# Patient Record
Sex: Female | Born: 1965 | Race: White | Hispanic: No | Marital: Married | Smoking: Former smoker
Health system: Southern US, Community
[De-identification: ages and names within clinical notes are randomized; demographics above are authoritative.]

## PROBLEM LIST (undated history)

## (undated) DIAGNOSIS — I10 Essential (primary) hypertension: Secondary | ICD-10-CM

## (undated) DIAGNOSIS — G5603 Carpal tunnel syndrome, bilateral upper limbs: Secondary | ICD-10-CM

## (undated) DIAGNOSIS — F419 Anxiety disorder, unspecified: Secondary | ICD-10-CM

## (undated) DIAGNOSIS — L0390 Cellulitis, unspecified: Secondary | ICD-10-CM

## (undated) DIAGNOSIS — A045 Campylobacter enteritis: Secondary | ICD-10-CM

## (undated) DIAGNOSIS — M1990 Unspecified osteoarthritis, unspecified site: Secondary | ICD-10-CM

## (undated) DIAGNOSIS — F429 Obsessive-compulsive disorder, unspecified: Secondary | ICD-10-CM

## (undated) DIAGNOSIS — M722 Plantar fascial fibromatosis: Secondary | ICD-10-CM

## (undated) HISTORY — PX: SPINE SURGERY: SHX786

## (undated) HISTORY — DX: Plantar fascial fibromatosis: M72.2

## (undated) HISTORY — DX: Unspecified osteoarthritis, unspecified site: M19.90

## (undated) HISTORY — DX: Cellulitis, unspecified: L03.90

## (undated) HISTORY — PX: EYE SURGERY: SHX253

## (undated) HISTORY — DX: Obsessive-compulsive disorder, unspecified: F42.9

## (undated) HISTORY — DX: Campylobacter enteritis: A04.5

## (undated) HISTORY — DX: Essential (primary) hypertension: I10

## (undated) HISTORY — PX: CERVICAL DISCECTOMY: SHX98

## (undated) HISTORY — DX: Anxiety disorder, unspecified: F41.9

---
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
## 1994-03-02 DIAGNOSIS — A045 Campylobacter enteritis: Secondary | ICD-10-CM

## 1994-03-02 HISTORY — DX: Campylobacter enteritis: A04.5

## 1998-12-31 ENCOUNTER — Encounter: Admission: RE | Admit: 1998-12-31 | Discharge: 1999-03-31 | Payer: Self-pay | Admitting: *Deleted

## 2002-02-09 ENCOUNTER — Other Ambulatory Visit: Admission: RE | Admit: 2002-02-09 | Discharge: 2002-02-09 | Payer: Self-pay | Admitting: Obstetrics and Gynecology

## 2002-03-07 ENCOUNTER — Encounter: Payer: Self-pay | Admitting: Obstetrics and Gynecology

## 2002-03-07 ENCOUNTER — Encounter: Admission: RE | Admit: 2002-03-07 | Discharge: 2002-03-07 | Payer: Self-pay | Admitting: Obstetrics and Gynecology

## 2002-03-09 ENCOUNTER — Encounter: Payer: Self-pay | Admitting: Obstetrics and Gynecology

## 2002-03-09 ENCOUNTER — Encounter: Admission: RE | Admit: 2002-03-09 | Discharge: 2002-03-09 | Payer: Self-pay | Admitting: Obstetrics and Gynecology

## 2002-09-25 ENCOUNTER — Ambulatory Visit (HOSPITAL_COMMUNITY): Admission: RE | Admit: 2002-09-25 | Discharge: 2002-09-25 | Payer: Self-pay | Admitting: Family Medicine

## 2003-03-03 HISTORY — PX: RETINAL DETACHMENT SURGERY: SHX105

## 2003-04-04 ENCOUNTER — Ambulatory Visit (HOSPITAL_COMMUNITY): Admission: RE | Admit: 2003-04-04 | Discharge: 2003-04-04 | Payer: Self-pay | Admitting: Nurse Practitioner

## 2003-11-26 ENCOUNTER — Ambulatory Visit (HOSPITAL_COMMUNITY): Admission: RE | Admit: 2003-11-26 | Discharge: 2003-11-26 | Payer: Self-pay | Admitting: Ophthalmology

## 2004-06-23 ENCOUNTER — Ambulatory Visit (HOSPITAL_COMMUNITY): Admission: RE | Admit: 2004-06-23 | Discharge: 2004-06-23 | Payer: Self-pay | Admitting: Obstetrics and Gynecology

## 2005-03-02 DIAGNOSIS — F429 Obsessive-compulsive disorder, unspecified: Secondary | ICD-10-CM

## 2005-03-02 HISTORY — DX: Obsessive-compulsive disorder, unspecified: F42.9

## 2005-06-26 ENCOUNTER — Ambulatory Visit (HOSPITAL_COMMUNITY): Admission: RE | Admit: 2005-06-26 | Discharge: 2005-06-26 | Payer: Self-pay | Admitting: Obstetrics and Gynecology

## 2006-06-28 ENCOUNTER — Ambulatory Visit (HOSPITAL_COMMUNITY): Admission: RE | Admit: 2006-06-28 | Discharge: 2006-06-28 | Payer: Self-pay | Admitting: Obstetrics and Gynecology

## 2007-06-29 ENCOUNTER — Ambulatory Visit (HOSPITAL_COMMUNITY): Admission: RE | Admit: 2007-06-29 | Discharge: 2007-06-29 | Payer: Self-pay | Admitting: Obstetrics and Gynecology

## 2008-07-11 ENCOUNTER — Ambulatory Visit (HOSPITAL_COMMUNITY): Admission: RE | Admit: 2008-07-11 | Discharge: 2008-07-11 | Payer: Self-pay | Admitting: Obstetrics and Gynecology

## 2009-07-19 ENCOUNTER — Ambulatory Visit (HOSPITAL_COMMUNITY): Admission: RE | Admit: 2009-07-19 | Discharge: 2009-07-19 | Payer: Self-pay | Admitting: Obstetrics and Gynecology

## 2010-06-17 ENCOUNTER — Other Ambulatory Visit (HOSPITAL_COMMUNITY): Payer: Self-pay | Admitting: Obstetrics and Gynecology

## 2010-06-17 DIAGNOSIS — Z139 Encounter for screening, unspecified: Secondary | ICD-10-CM

## 2010-07-18 NOTE — Op Note (Signed)
NAMECHEMIKA, NIGHTENGALE NO.:  [id number]   MEDICAL RECORD NO.:  [id number]          PATIENT TYPE:  OIB   LOCATION:  2899                         FACILITY:  [facility]   PHYSICIAN:  Alford Highland. Rankin, M.D.   DATE OF BIRTH:  [birth year]   DATE OF PROCEDURE:  11/26/2003  DATE OF DISCHARGE:                                 OPERATIVE REPORT   PREOPERATIVE DIAGNOSES:  1.  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  2.  Lattice degeneration, left eye.   POSTOPERATIVE DIAGNOSES:  1.  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  2.  Lattice degeneration, left eye.   PROCEDURE:  1.  Scleral buckle with retinal cryopexy of left eye using 287, 70 and 240      elements.  2.  Retinal cryopexy.  3.  External drainage of subretinal fluid within the bed of the buckle      inferotemporally.   SURGEON:  Alford Highland. Rankin, M.D.   ANESTHESIA:  General endotracheal anesthesia.   INDICATION FOR PROCEDURE:  The patient is a 45 year old woman who had a  spontaneous rhegmatogenous retinal detachment of the left eye on the basis  of previous lattice degeneration.  Macula is on.  This is a subclinical  detachment, but extends from the 12 o'clock region to the 7:30 region  temporally in the left eye, but does have signs of clinical chronicity.   The patient understands this is an attempt to reattach the retina.  This is  an attempt to prevent progression of the retinal detachment so as to allow  the best favorable prognosis for long-term visual acuity and visual field  retina maintenance.  She understands the risks of anesthesia including the  rare occurrence of death, but also to the eye including, but not limited to,  hemorrhage, infection, scarring, need for further surgery, no change in  vision, loss of vision and progression of disease despite intervention.   DESCRIPTION OF PROCEDURE:  After appropriate signed consent was obtained,  she was taken to the  operating room.  In the operating room, general  endotracheal anesthesia was instituted without difficulty.  The left  periocular region was sterilely prepped and draped in the usual ophthalmic  fashion.  A lid speculum was applied.  A conjunctival peritomy was then  fashioned 360 degrees and the rectus muscle was isolated on 2-0 silk ties.  Indirect ophthalmoscopy was then performed and under direct observation  using the scope, retinal cryopexy was applied in the bed of the detachment  and to the areas of retinal breaks.  Retinal breaks were identified at  12:30, 1 o'clock, 2, 2:30, 3, 3:30, 4, 5 and 7 o'clock.   A 287 solid silicone explant was selected and placed under the superolateral  and inferior rectus muscles with a 240 encircling band; this was secured in  the inferonasal quadrant with a 7.0 Watzke sleeve.  Preplaced 5-0 Mersilene  mattress sutures were placed in the inferotemporal quadrant of the left eye,  followed by one each in the remaining quadrants.  Under ophthalmoscopy,  external  drainage of subretinal fluid was carried out in the bed of the  buckle.  All fluid was removed in this fashion.  No complications occurred  and the buckle was tightened permanently.  The ends were then joined and  appropriate tension applied with the 240 band.  Remainder of the buckle was  irrigated with bug juice.  Conjunctiva and Tenon's were then brought forward  and closed with an interrupted 7-0 Vicryl suture.  Intraocular pressure was  assessed and found to be adequate.  At this time, a sterile patch and Fox  shield were applied.  She was awakened from anesthesia without difficulty  and taken to the recovery room in good and stable condition.       GAR/MEDQ  D:  11/26/2003  T:  11/27/2003  Job:  [id number]

## 2010-07-25 ENCOUNTER — Ambulatory Visit (HOSPITAL_COMMUNITY)
Admission: RE | Admit: 2010-07-25 | Discharge: 2010-07-25 | Disposition: A | Discharge status: Home / Self Care | Payer: Managed Care, Other (non HMO) | Source: Ambulatory Visit | Attending: Obstetrics and Gynecology | Admitting: Obstetrics and Gynecology

## 2010-07-25 DIAGNOSIS — Z139 Encounter for screening, unspecified: Secondary | ICD-10-CM

## 2010-07-25 DIAGNOSIS — Z1231 Encounter for screening mammogram for malignant neoplasm of breast: Secondary | ICD-10-CM | POA: Insufficient documentation

## 2011-06-23 ENCOUNTER — Other Ambulatory Visit: Payer: Self-pay | Admitting: Obstetrics and Gynecology

## 2011-06-23 DIAGNOSIS — Z139 Encounter for screening, unspecified: Secondary | ICD-10-CM

## 2011-07-31 ENCOUNTER — Ambulatory Visit (HOSPITAL_COMMUNITY)
Admission: RE | Admit: 2011-07-31 | Discharge: 2011-07-31 | Disposition: A | Discharge status: Home / Self Care | Payer: BC Managed Care – PPO | Source: Ambulatory Visit | Attending: Obstetrics and Gynecology | Admitting: Obstetrics and Gynecology

## 2011-07-31 DIAGNOSIS — Z139 Encounter for screening, unspecified: Secondary | ICD-10-CM

## 2011-07-31 DIAGNOSIS — Z1231 Encounter for screening mammogram for malignant neoplasm of breast: Secondary | ICD-10-CM | POA: Insufficient documentation

## 2011-12-11 ENCOUNTER — Ambulatory Visit: Payer: BC Managed Care – PPO | Admitting: Family Medicine

## 2011-12-14 ENCOUNTER — Encounter: Payer: Self-pay | Admitting: Family Medicine

## 2011-12-14 ENCOUNTER — Ambulatory Visit (INDEPENDENT_AMBULATORY_CARE_PROVIDER_SITE_OTHER): Payer: BC Managed Care – PPO | Admitting: Family Medicine

## 2011-12-14 VITALS — BP 128/80 | HR 84 | Resp 15 | Ht 67.0 in | Wt 215.0 lb

## 2011-12-14 DIAGNOSIS — M1990 Unspecified osteoarthritis, unspecified site: Secondary | ICD-10-CM

## 2011-12-14 DIAGNOSIS — F411 Generalized anxiety disorder: Secondary | ICD-10-CM

## 2011-12-14 DIAGNOSIS — Z309 Encounter for contraceptive management, unspecified: Secondary | ICD-10-CM

## 2011-12-14 DIAGNOSIS — E669 Obesity, unspecified: Secondary | ICD-10-CM

## 2011-12-14 DIAGNOSIS — F419 Anxiety disorder, unspecified: Secondary | ICD-10-CM

## 2011-12-14 DIAGNOSIS — I10 Essential (primary) hypertension: Secondary | ICD-10-CM

## 2011-12-14 DIAGNOSIS — Z23 Encounter for immunization: Secondary | ICD-10-CM

## 2011-12-14 MED ORDER — MEDROXYPROGESTERONE ACETATE 150 MG/ML IM SUSP
150.0000 mg | Freq: Once | INTRAMUSCULAR | Status: AC
  Administered 2011-12-14: 150 mg via INTRAMUSCULAR

## 2011-12-14 NOTE — Patient Instructions (Addendum)
Flu shot given  Depo given today  Continue current medications I will review records  F/U 6 months

## 2011-12-16 ENCOUNTER — Encounter: Payer: Self-pay | Admitting: Family Medicine

## 2011-12-16 DIAGNOSIS — F411 Generalized anxiety disorder: Secondary | ICD-10-CM | POA: Insufficient documentation

## 2011-12-16 DIAGNOSIS — I10 Essential (primary) hypertension: Secondary | ICD-10-CM | POA: Insufficient documentation

## 2011-12-16 DIAGNOSIS — E669 Obesity, unspecified: Secondary | ICD-10-CM | POA: Insufficient documentation

## 2011-12-16 DIAGNOSIS — M1990 Unspecified osteoarthritis, unspecified site: Secondary | ICD-10-CM | POA: Insufficient documentation

## 2011-12-16 NOTE — Assessment & Plan Note (Signed)
BP looks good today, no change to meds 

## 2011-12-16 NOTE — Assessment & Plan Note (Signed)
Low dose xanax currently, she states she is a high strung person

## 2011-12-16 NOTE — Progress Notes (Signed)
  Subjective:    Patient ID: Lori Mullins, female    DOB: [birth year], 46 y.o.   MRN: [id number]  HPI Pt here to establish care, previous PCP Dr. Stephania Fragmin, [facility]. GYN- [facility] Medications and history reviewed No specific concerns today, needs Depo shot  Anxiety- takes 1 tablet at bedtime of xanax occasionally during the day  HTN- maintained on benicar and BP has done well Obesity- has lost 20lbs, has kept weight off, exercises daily, quit smoking as well  OA0 uses voltern gels for hands and ultram for back and Hip, history of diskectomy in 1996   Review of Systems  GEN- denies fatigue, fever, weight loss,weakness, recent illness HEENT- denies eye drainage, change in vision, nasal discharge, CVS- denies chest pain, palpitations RESP- denies SOB, cough, wheeze ABD- denies N/V, change in stools, abd pain GU- denies dysuria, hematuria, dribbling, incontinence MSK- +joint pain, muscle aches, injury Neuro- denies headache, dizziness, syncope, seizure activity      Objective:   Physical Exam GEN- NAD, alert and oriented x3 HEENT- PERRL, EOMI, non injected sclera, pink conjunctiva, MMM, oropharynx clear Neck- Supple,  CVS- RRR, no murmur RESP-CTAB EXT- No edema Pulses- Radial 2+ Psych- anxious appearing, speaks quickly, not depressed appearing       Assessment & Plan:

## 2011-12-16 NOTE — Assessment & Plan Note (Signed)
Continue current meds 

## 2012-01-01 ENCOUNTER — Encounter: Payer: Self-pay | Admitting: Family Medicine

## 2012-03-28 ENCOUNTER — Telehealth: Payer: Self-pay | Admitting: Family Medicine

## 2012-03-28 MED ORDER — ALPRAZOLAM 1 MG PO TABS: 1.0000 mg | ORAL_TABLET | Freq: Every evening | ORAL | Status: DC | PRN

## 2012-03-28 NOTE — Telephone Encounter (Signed)
Is it ok to refill this?  I don't see where we have ever done this.

## 2012-03-28 NOTE — Telephone Encounter (Signed)
Med refilled.

## 2012-05-02 ENCOUNTER — Other Ambulatory Visit: Payer: Self-pay | Admitting: Family Medicine

## 2012-05-03 ENCOUNTER — Telehealth: Payer: Self-pay | Admitting: Pediatrics

## 2012-05-16 NOTE — Telephone Encounter (Signed)
Message unclear.

## 2012-06-10 ENCOUNTER — Ambulatory Visit (INDEPENDENT_AMBULATORY_CARE_PROVIDER_SITE_OTHER): Payer: BC Managed Care – PPO | Admitting: Family Medicine

## 2012-06-10 ENCOUNTER — Encounter: Payer: Self-pay | Admitting: Family Medicine

## 2012-06-10 VITALS — BP 104/68 | HR 74 | Resp 18 | Ht 67.0 in | Wt 216.0 lb

## 2012-06-10 DIAGNOSIS — F419 Anxiety disorder, unspecified: Secondary | ICD-10-CM

## 2012-06-10 DIAGNOSIS — M1990 Unspecified osteoarthritis, unspecified site: Secondary | ICD-10-CM

## 2012-06-10 DIAGNOSIS — I10 Essential (primary) hypertension: Secondary | ICD-10-CM

## 2012-06-10 DIAGNOSIS — F411 Generalized anxiety disorder: Secondary | ICD-10-CM

## 2012-06-10 DIAGNOSIS — E669 Obesity, unspecified: Secondary | ICD-10-CM

## 2012-06-10 MED ORDER — HYDROCODONE-ACETAMINOPHEN 5-325 MG PO TABS: 1.0000 | ORAL_TABLET | ORAL | Status: DC | PRN

## 2012-06-10 MED ORDER — ALPRAZOLAM 1 MG PO TABS: 1.0000 mg | ORAL_TABLET | Freq: Two times a day (BID) | ORAL | Status: DC | PRN

## 2012-06-10 MED ORDER — OLMESARTAN MEDOXOMIL 20 MG PO TABS: 20.0000 mg | ORAL_TABLET | Freq: Every day | ORAL | Status: DC

## 2012-06-10 NOTE — Patient Instructions (Addendum)
Vicodin for severe pain Xanax refilled  Take only 1 anti-histamine Get the fasting labs  I will get records from [facility] F/U 4 months

## 2012-06-11 ENCOUNTER — Encounter: Payer: Self-pay | Admitting: Family Medicine

## 2012-06-11 NOTE — Progress Notes (Signed)
  Subjective:    Patient ID: MASON BURLEIGH, female    DOB: [birth year], 47 y.o.   MRN: [id number]  HPI  Pt here to f/u chronic medical problems.  doing well, continues to exercise. 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 She stopped hormone therapy with GYN as she has long bleeding cycle, takes black cohosh and estroven vitamin which helps control her menopause symptoms. Due for fasting labs Medications reviewed  Review of Systems  GEN- denies fatigue, fever, weight loss,weakness, recent illness HEENT- denies eye drainage, change in vision, nasal discharge, CVS- denies chest pain, palpitations RESP- denies SOB, cough, wheeze ABD- denies N/V, change in stools, abd pain GU- denies dysuria, hematuria, dribbling, incontinence MSK- + joint pain, muscle aches, injury Neuro- denies headache, dizziness, syncope, seizure activity      Objective:   Physical Exam GEN- NAD, alert and oriented x3,obesity HEENT- PERRL, EOMI, non injected sclera, pink conjunctiva, MMM, oropharynx clear Neck- Supple,  CVS- RRR, no murmur RESP-CTAB Hip - Decreased ROM, stiffness with getting out of chair EXT- No edema Pulses- Radial, DP- 2+        Assessment & Plan:

## 2012-06-11 NOTE — Assessment & Plan Note (Signed)
Continue xanax, takes daily, if increased anxiety has 1 during day

## 2012-06-11 NOTE — Assessment & Plan Note (Signed)
BP is overtreated, decrease to 10mg  benicar New script given

## 2012-06-11 NOTE — Assessment & Plan Note (Signed)
Will give her hydrocodone 20 tablets to have at home for severe pain Continue ultram, flexeril

## 2012-06-11 NOTE — Assessment & Plan Note (Signed)
Fasting labs A1C 5.6% in 2012 Continues to walk 3 miles a day, needs to concentrate on low carb, low fat as well

## 2012-06-17 ENCOUNTER — Ambulatory Visit: Payer: BC Managed Care – PPO | Admitting: Family Medicine

## 2012-06-17 LAB — CBC
HCT: 41.9 % (ref 36.0–46.0)
Platelets: 246 10*3/uL (ref 150–400)

## 2012-06-17 LAB — COMPREHENSIVE METABOLIC PANEL
ALT: 19 U/L (ref 0–35)
AST: 18 U/L (ref 0–37)
Albumin: 4.1 g/dL (ref 3.5–5.2)
Alkaline Phosphatase: 61 U/L (ref 39–117)
CO2: 27 mEq/L (ref 19–32)
Chloride: 105 mEq/L (ref 96–112)
Glucose, Bld: 89 mg/dL (ref 70–99)
Total Bilirubin: 0.4 mg/dL (ref 0.3–1.2)
Total Protein: 6.2 g/dL (ref 6.0–8.3)

## 2012-06-17 LAB — LIPID PANEL
Cholesterol: 138 mg/dL (ref 0–200)
HDL: 67 mg/dL (ref >39)
LDL Cholesterol: 58 mg/dL (ref 0–99)
Total CHOL/HDL Ratio: 2.1 Ratio
Triglycerides: 67 mg/dL (ref <150)
VLDL: 13 mg/dL (ref 0–40)

## 2012-06-20 ENCOUNTER — Other Ambulatory Visit (HOSPITAL_COMMUNITY): Payer: Self-pay | Admitting: Obstetrics and Gynecology

## 2012-06-20 DIAGNOSIS — Z139 Encounter for screening, unspecified: Secondary | ICD-10-CM

## 2012-06-28 ENCOUNTER — Encounter: Payer: Self-pay | Admitting: *Deleted

## 2012-07-05 ENCOUNTER — Other Ambulatory Visit: Payer: Self-pay | Admitting: Family Medicine

## 2012-07-06 ENCOUNTER — Telehealth: Payer: Self-pay | Admitting: Family Medicine

## 2012-07-07 ENCOUNTER — Telehealth: Payer: Self-pay | Admitting: Family Medicine

## 2012-07-07 NOTE — Telephone Encounter (Signed)
See previous msg.

## 2012-07-07 NOTE — Telephone Encounter (Signed)
Called [facility] and they are available

## 2012-07-07 NOTE — Telephone Encounter (Signed)
meds refilled 

## 2012-07-18 ENCOUNTER — Telehealth: Payer: Self-pay | Admitting: Family Medicine

## 2012-07-18 MED ORDER — SILVER SULFADIAZINE 1 % EX CREA: TOPICAL_CREAM | Freq: Every day | CUTANEOUS | Status: DC

## 2012-07-18 NOTE — Telephone Encounter (Signed)
Does this patient need to be seen or can silvadene ordered?

## 2012-07-18 NOTE — Telephone Encounter (Signed)
Okay to send silvadene script

## 2012-07-18 NOTE — Telephone Encounter (Signed)
Patient made aware via voicemail.  Rx sent.

## 2012-08-01 ENCOUNTER — Other Ambulatory Visit (HOSPITAL_COMMUNITY): Payer: Self-pay | Admitting: Obstetrics & Gynecology

## 2012-08-01 ENCOUNTER — Ambulatory Visit (HOSPITAL_COMMUNITY)
Admission: RE | Admit: 2012-08-01 | Discharge: 2012-08-01 | Disposition: A | Discharge status: Home / Self Care | Payer: BC Managed Care – PPO | Source: Ambulatory Visit | Attending: Obstetrics and Gynecology | Admitting: Obstetrics and Gynecology

## 2012-08-01 ENCOUNTER — Other Ambulatory Visit (HOSPITAL_COMMUNITY): Payer: Self-pay | Admitting: Obstetrics and Gynecology

## 2012-08-01 DIAGNOSIS — Z139 Encounter for screening, unspecified: Secondary | ICD-10-CM

## 2012-08-01 DIAGNOSIS — Z1231 Encounter for screening mammogram for malignant neoplasm of breast: Secondary | ICD-10-CM | POA: Insufficient documentation

## 2012-09-14 ENCOUNTER — Other Ambulatory Visit: Payer: Self-pay | Admitting: Family Medicine

## 2012-09-14 NOTE — Telephone Encounter (Signed)
Ok to refill 

## 2012-09-14 NOTE — Telephone Encounter (Signed)
Okay to fill? 

## 2012-10-14 ENCOUNTER — Ambulatory Visit: Payer: BC Managed Care – PPO | Admitting: Family Medicine

## 2012-10-14 ENCOUNTER — Ambulatory Visit (INDEPENDENT_AMBULATORY_CARE_PROVIDER_SITE_OTHER): Payer: BC Managed Care – PPO | Admitting: Family Medicine

## 2012-10-14 VITALS — BP 114/68 | HR 68 | Temp 97.0°F | Resp 18 | Wt 214.0 lb

## 2012-10-14 DIAGNOSIS — I10 Essential (primary) hypertension: Secondary | ICD-10-CM

## 2012-10-14 DIAGNOSIS — E669 Obesity, unspecified: Secondary | ICD-10-CM

## 2012-10-14 DIAGNOSIS — M1990 Unspecified osteoarthritis, unspecified site: Secondary | ICD-10-CM

## 2012-10-14 MED ORDER — HYDROCODONE-ACETAMINOPHEN 5-325 MG PO TABS: 1.0000 | ORAL_TABLET | ORAL | Status: DC | PRN

## 2012-10-14 NOTE — Progress Notes (Signed)
  Subjective:    Patient ID: Lori Mullins, female    DOB: 1966/01/21, 47 y.o.   MRN: [id number]  HPI  Pt here to f/u chronic medical problems. No acute concerns. Had a burn to hand a few months ago from bacon grease now resolved. Continues to have arthritis pain in knees, shoulder and back but maintains exercise and uses flexeril and hydrocodone as needed but rarley.  Meds reviewed. Continues to use xanax at bedtime for sleep. Did not she had 2 episdoes or dizzy spells lasted a few seconds and resolved over the past couple of months, BP has been good.   Review of Systems   GEN- denies fatigue, fever, weight loss,weakness, recent illness HEENT- denies eye drainage, change in vision, nasal discharge, CVS- denies chest pain, palpitations RESP- denies SOB, cough, wheeze ABD- denies N/V, change in stools, abd pain GU- denies dysuria, hematuria, dribbling, incontinence MSK- + joint pain, muscle aches, injury Neuro- denies headache, dizziness, syncope, seizure activity      Objective:   Physical Exam GEN- NAD, alert and oriented x3 HEENT- PERRL, EOMI, non injected sclera, pink conjunctiva, MMM, oropharynx clear, TM clear bilat Neck- Supple, no bruit CVS- RRR, no murmur RESP-CTAB EXT- No edema Pulses- Radial, DP- 2+ Skin- in tact        Assessment & Plan:

## 2012-10-14 NOTE — Patient Instructions (Addendum)
F/U 6 months Continue current medications 

## 2012-10-16 ENCOUNTER — Encounter: Payer: Self-pay | Admitting: Family Medicine

## 2012-10-16 NOTE — Assessment & Plan Note (Signed)
Unchanged, but symptoms control, meds refilled

## 2012-10-16 NOTE — Assessment & Plan Note (Signed)
Well controlled on decreased dose of meds,continue

## 2012-10-16 NOTE — Assessment & Plan Note (Signed)
Unchanged continues to walk and exercise on regular basis

## 2012-10-17 ENCOUNTER — Telehealth: Payer: Self-pay | Admitting: Family Medicine

## 2012-10-17 NOTE — Telephone Encounter (Signed)
Okay to refill,  

## 2012-10-17 NOTE — Telephone Encounter (Signed)
Xanax 1 mg tab 1 BID prn #60 last rf 09/15/12 Tramadol 50 mg tab 1 BID prn pain #60 last rf 09/14/12 Flexeril 10 mg tab 1 TID prn muscle spasms #30 last rf 09/14/12

## 2012-10-17 NOTE — Telephone Encounter (Signed)
?   OK to Refill  

## 2012-10-18 MED ORDER — TRAMADOL HCL 50 MG PO TABS: ORAL_TABLET | ORAL | Status: DC

## 2012-10-18 MED ORDER — CYCLOBENZAPRINE HCL 10 MG PO TABS: ORAL_TABLET | ORAL | Status: DC

## 2012-10-18 MED ORDER — ALPRAZOLAM 1 MG PO TABS: ORAL_TABLET | ORAL | Status: DC

## 2012-10-18 NOTE — Telephone Encounter (Signed)
rx was printed and faxed to pharmacy 

## 2012-11-15 ENCOUNTER — Telehealth: Payer: Self-pay | Admitting: Family Medicine

## 2012-11-15 MED ORDER — ALPRAZOLAM 1 MG PO TABS: ORAL_TABLET | ORAL | Status: DC

## 2012-11-15 NOTE — Telephone Encounter (Signed)
Xanax 1 mg tab 1 BID prn #60 last rf 10/18/12

## 2012-11-15 NOTE — Telephone Encounter (Signed)
I would send this to Dr. Jeanice Lim.

## 2012-11-15 NOTE — Telephone Encounter (Signed)
Okay to refill, per previous script

## 2012-11-15 NOTE — Telephone Encounter (Signed)
Ok to refill 

## 2012-11-15 NOTE — Telephone Encounter (Signed)
Med phoned in °

## 2012-11-16 ENCOUNTER — Telehealth: Payer: Self-pay | Admitting: Family Medicine

## 2012-11-16 NOTE — Telephone Encounter (Signed)
Pt wants to know why we are not refilling her xanax with extra refills. She said that Dr. Jeanice Lim has always done this, but now she did not this time. Can you please call her when you get a chance?

## 2012-12-13 ENCOUNTER — Telehealth: Payer: Self-pay | Admitting: Family Medicine

## 2012-12-13 MED ORDER — ALPRAZOLAM 1 MG PO TABS: ORAL_TABLET | ORAL | Status: DC

## 2012-12-13 NOTE — Telephone Encounter (Signed)
rx called in

## 2012-12-13 NOTE — Telephone Encounter (Signed)
Alprazolam 1mg  BID PRN  Last refill 11/15/12  #60  OK refill?

## 2012-12-13 NOTE — Telephone Encounter (Signed)
Okay to refill, give 3  

## 2012-12-15 ENCOUNTER — Telehealth: Payer: Self-pay | Admitting: Family Medicine

## 2012-12-15 NOTE — Telephone Encounter (Signed)
Patient needs to have Xanex refilled.

## 2012-12-15 NOTE — Telephone Encounter (Signed)
Med was refilled on 12/13/12

## 2013-01-05 ENCOUNTER — Other Ambulatory Visit: Payer: Self-pay

## 2013-01-17 ENCOUNTER — Telehealth: Payer: Self-pay | Admitting: *Deleted

## 2013-01-17 MED ORDER — TRAMADOL HCL 50 MG PO TABS: ORAL_TABLET | ORAL | Status: DC

## 2013-01-17 MED ORDER — CYCLOBENZAPRINE HCL 10 MG PO TABS: ORAL_TABLET | ORAL | Status: DC

## 2013-01-17 NOTE — Telephone Encounter (Signed)
Cyclobenzaprine 10mg  take 1 tablet by mouth TID daily as needed for muscle spasms #30?; tramadol 50 mg take 1 tablet BID daily as needed for pain #60

## 2013-01-17 NOTE — Addendum Note (Signed)
Addended by: Elvina Mattes T on: 01/17/2013 05:06 PM   Modules accepted: Orders

## 2013-01-17 NOTE — Telephone Encounter (Signed)
Give 3 refills on both

## 2013-01-17 NOTE — Telephone Encounter (Signed)
Med phoned in °

## 2013-01-22 ENCOUNTER — Encounter: Payer: Self-pay | Admitting: Family Medicine

## 2013-02-13 ENCOUNTER — Encounter: Payer: Self-pay | Admitting: Family Medicine

## 2013-02-14 ENCOUNTER — Other Ambulatory Visit: Payer: Self-pay | Admitting: *Deleted

## 2013-02-14 MED ORDER — HYDROCODONE-ACETAMINOPHEN 5-325 MG PO TABS: 1.0000 | ORAL_TABLET | ORAL | Status: DC | PRN

## 2013-02-14 NOTE — Telephone Encounter (Signed)
Med printed ready for provider signature and pt to pick up(notified by email)

## 2013-02-15 ENCOUNTER — Ambulatory Visit (INDEPENDENT_AMBULATORY_CARE_PROVIDER_SITE_OTHER): Payer: BC Managed Care – PPO | Admitting: *Deleted

## 2013-02-15 DIAGNOSIS — Z23 Encounter for immunization: Secondary | ICD-10-CM

## 2013-03-16 ENCOUNTER — Telehealth: Payer: Self-pay | Admitting: *Deleted

## 2013-03-16 NOTE — Telephone Encounter (Signed)
?   Ok to refill, last refill 02/13/13, last ov 10/14/12

## 2013-03-17 NOTE — Telephone Encounter (Signed)
Okay to refill , give 2 

## 2013-03-20 MED ORDER — ALPRAZOLAM 1 MG PO TABS: ORAL_TABLET | ORAL | Status: DC

## 2013-03-20 NOTE — Telephone Encounter (Signed)
Meds refilled.

## 2013-04-17 ENCOUNTER — Telehealth: Payer: Self-pay | Admitting: *Deleted

## 2013-04-17 ENCOUNTER — Ambulatory Visit: Payer: BC Managed Care – PPO | Admitting: Family Medicine

## 2013-04-17 NOTE — Telephone Encounter (Signed)
Okay to refill? 

## 2013-04-17 NOTE — Telephone Encounter (Signed)
Ok to refill 

## 2013-04-17 NOTE — Telephone Encounter (Signed)
Call placed to patient to reschedule 6 month follow-up due to inclement weather. Appointment rescheduled.   Patient requested that medications be refilled: Flexaril, Tramadol, and Xanax.   Triage to be made aware.

## 2013-04-19 MED ORDER — TRAMADOL HCL 50 MG PO TABS: ORAL_TABLET | ORAL | Status: DC

## 2013-04-19 MED ORDER — CYCLOBENZAPRINE HCL 10 MG PO TABS: ORAL_TABLET | ORAL | Status: DC

## 2013-04-19 NOTE — Telephone Encounter (Signed)
Meds refilled.

## 2013-05-02 ENCOUNTER — Ambulatory Visit (INDEPENDENT_AMBULATORY_CARE_PROVIDER_SITE_OTHER): Payer: BC Managed Care – PPO | Admitting: Family Medicine

## 2013-05-02 ENCOUNTER — Encounter: Payer: Self-pay | Admitting: Family Medicine

## 2013-05-02 VITALS — BP 148/88 | HR 80 | Temp 98.3°F | Resp 16 | Ht 66.0 in | Wt 211.0 lb

## 2013-05-02 DIAGNOSIS — M1990 Unspecified osteoarthritis, unspecified site: Secondary | ICD-10-CM

## 2013-05-02 DIAGNOSIS — Z13228 Encounter for screening for other metabolic disorders: Secondary | ICD-10-CM

## 2013-05-02 DIAGNOSIS — Z0000 Encounter for general adult medical examination without abnormal findings: Secondary | ICD-10-CM

## 2013-05-02 DIAGNOSIS — I10 Essential (primary) hypertension: Secondary | ICD-10-CM

## 2013-05-02 DIAGNOSIS — Z130 Encounter for screening for diseases of the blood and blood-forming organs and certain disorders involving the immune mechanism: Secondary | ICD-10-CM

## 2013-05-02 DIAGNOSIS — Z1321 Encounter for screening for nutritional disorder: Secondary | ICD-10-CM

## 2013-05-02 DIAGNOSIS — Z1329 Encounter for screening for other suspected endocrine disorder: Secondary | ICD-10-CM

## 2013-05-02 DIAGNOSIS — E669 Obesity, unspecified: Secondary | ICD-10-CM

## 2013-05-02 MED ORDER — TRAMADOL HCL 50 MG PO TABS: ORAL_TABLET | ORAL | Status: DC

## 2013-05-02 MED ORDER — ALPRAZOLAM 1 MG PO TABS: ORAL_TABLET | ORAL | Status: DC

## 2013-05-02 MED ORDER — HYDROCODONE-ACETAMINOPHEN 5-325 MG PO TABS: 1.0000 | ORAL_TABLET | ORAL | Status: DC | PRN

## 2013-05-02 NOTE — Assessment & Plan Note (Signed)
Medications refilled

## 2013-05-02 NOTE — Progress Notes (Signed)
Patient ID: Lori Mullins, female   DOB: [birth year], 48 y.o.   MRN: [id number]   Subjective:    Patient ID: Lori Mullins, female    DOB: 06-Nov-1965, 48 y.o.   MRN: [id number]  Patient presents for Annual Exam  Patient here for physical exam. She gets her GYN done by Dr. Hal Neer and she is on a three-year cycle. Her mammogram is up-to-date. Her immunizations are up-to-date. She has no specific concerns today. He is due for fasting labs for her job  Is been off of her blood pressure medication for the past 5 months as she was having some hypotensive episodes. Today he she left work late and was very stressed and into the office therefore her blood pressures elevated some. She's been monitoring her blood pressure at home and levels have been less than 140/90 on a regular basis. She still trying to exercise and watch her diet.  Medications were reviewed. She still using tramadol and occasionally hydrocodone for her severe arthritis. She also uses her Xanax as needed for her anxiety though this is rare.   Review Of Systems:  GEN- denies fatigue, fever, weight loss,weakness, recent illness HEENT- denies eye drainage, change in vision, nasal discharge, CVS- denies chest pain, palpitations RESP- denies SOB, cough, wheeze ABD- denies N/V, change in stools, abd pain GU- denies dysuria, hematuria, dribbling, incontinence MSK- denies joint pain, muscle aches, injury Neuro- denies headache, dizziness, syncope, seizure activity       Objective:    BP 148/88  Pulse 80  Temp(Src) 98.3 F (36.8 C)  Resp 16  Ht 5\' 6"  (1.676 m)  Wt 211 lb (95.709 kg)  BMI 34.07 kg/m2  LMP 04/24/2013 GEN- NAD, alert and oriented x3 HEENT- PERRL, EOMI, non injected sclera, pink conjunctiva, MMM, oropharynx clear, TM clear bilat Neck- Supple, no thyromegaly CVS- RRR, no murmur RESP-CTAB ABD-NABS,soft,NT,ND GU-Deferred EXT- No edema Pulses- Radial, DP- 2+ Psych- normal affect and mood       Assessment  & Plan:      Problem List Items Addressed This Visit   Obesity     Continue to work on diet and exercise she continues to lose weight slowly but steadily    OA (osteoarthritis)     Medications refilled    Relevant Medications      HYDROcodone-acetaminophen (NORCO/VICODIN) 5-325 MG per tablet   Essential hypertension, benign     Blood pressures elevated some today. She was previously on Benicar 10 mg. She will continue to monitor her blood pressure there was a lot of stress regarding her appointment today      Other Visit Diagnoses   Routine general medical examination at a health care facility    -  Primary    Fasting labs, immunizations UTD, PAP by GYN next year, Mammogram in June . Discussed dental and eye visit       Note: This dictation was prepared with Dragon dictation along with smaller phrase technology. Any transcriptional errors that result from this process are unintentional.

## 2013-05-02 NOTE — Assessment & Plan Note (Signed)
Continue to work on diet and exercise she continues to lose weight slowly but steadily

## 2013-05-02 NOTE — Assessment & Plan Note (Signed)
Blood pressures elevated some today. She was previously on Benicar 10 mg. She will continue to monitor her blood pressure there was a lot of stress regarding her appointment today

## 2013-05-02 NOTE — Patient Instructions (Signed)
F/U 6 months  Continue current medications Return for fasting labs

## 2013-05-18 ENCOUNTER — Other Ambulatory Visit: Payer: BC Managed Care – PPO

## 2013-05-18 DIAGNOSIS — Z0000 Encounter for general adult medical examination without abnormal findings: Secondary | ICD-10-CM

## 2013-05-18 DIAGNOSIS — Z1321 Encounter for screening for nutritional disorder: Secondary | ICD-10-CM

## 2013-05-18 DIAGNOSIS — I10 Essential (primary) hypertension: Secondary | ICD-10-CM

## 2013-05-18 LAB — LIPID PANEL
Cholesterol: 131 mg/dL (ref 0–200)
HDL: 77 mg/dL (ref >39)
LDL CALC: 44 mg/dL (ref 0–99)
Total CHOL/HDL Ratio: 1.7 Ratio
Triglycerides: 52 mg/dL (ref <150)
VLDL: 10 mg/dL (ref 0–40)

## 2013-05-18 LAB — CBC WITH DIFFERENTIAL/PLATELET
BASOS ABS: 0.1 10*3/uL (ref 0.0–0.1)
Basophils Relative: 1 % (ref 0–1)
EOS ABS: 0.2 10*3/uL (ref 0.0–0.7)
EOS PCT: 3 % (ref 0–5)
HCT: 42.6 % (ref 36.0–46.0)
Hemoglobin: 14.8 g/dL (ref 12.0–15.0)
LYMPHS PCT: 28 % (ref 12–46)
Lymphs Abs: 1.8 10*3/uL (ref 0.7–4.0)
MCH: 30.5 pg (ref 26.0–34.0)
MCHC: 34.7 g/dL (ref 30.0–36.0)
MCV: 87.7 fL (ref 78.0–100.0)
MONO ABS: 0.5 10*3/uL (ref 0.1–1.0)
Monocytes Relative: 8 % (ref 3–12)
Neutro Abs: 4 10*3/uL (ref 1.7–7.7)
Neutrophils Relative %: 60 % (ref 43–77)
Platelets: 265 10*3/uL (ref 150–400)
RBC: 4.86 MIL/uL (ref 3.87–5.11)
RDW: 13.3 % (ref 11.5–15.5)
WBC: 6.6 10*3/uL (ref 4.0–10.5)

## 2013-05-18 LAB — COMPREHENSIVE METABOLIC PANEL
ALBUMIN: 4.3 g/dL (ref 3.5–5.2)
ALK PHOS: 61 U/L (ref 39–117)
ALT: 18 U/L (ref 0–35)
AST: 17 U/L (ref 0–37)
BUN: 11 mg/dL (ref 6–23)
CHLORIDE: 103 meq/L (ref 96–112)
CO2: 25 mEq/L (ref 19–32)
Calcium: 9.3 mg/dL (ref 8.4–10.5)
Creat: 0.64 mg/dL (ref 0.50–1.10)
GLUCOSE: 76 mg/dL (ref 70–99)
POTASSIUM: 4.6 meq/L (ref 3.5–5.3)
SODIUM: 139 meq/L (ref 135–145)
TOTAL PROTEIN: 6.4 g/dL (ref 6.0–8.3)
Total Bilirubin: 0.4 mg/dL (ref 0.2–1.2)

## 2013-05-19 LAB — VITAMIN D 25 HYDROXY (VIT D DEFICIENCY, FRACTURES): VIT D 25 HYDROXY: 44 ng/mL (ref 30–89)

## 2013-05-20 ENCOUNTER — Encounter: Payer: Self-pay | Admitting: Family Medicine

## 2013-05-22 ENCOUNTER — Encounter: Payer: Self-pay | Admitting: Family Medicine

## 2013-06-13 ENCOUNTER — Other Ambulatory Visit: Payer: Self-pay | Admitting: Family Medicine

## 2013-06-13 NOTE — Telephone Encounter (Signed)
Okay to refill? 

## 2013-06-13 NOTE — Telephone Encounter (Signed)
Last RF 2/18 #30.  Last OV 05/02/13  OK refill?

## 2013-06-27 ENCOUNTER — Other Ambulatory Visit (HOSPITAL_COMMUNITY): Payer: Self-pay | Admitting: Obstetrics & Gynecology

## 2013-06-27 DIAGNOSIS — Z139 Encounter for screening, unspecified: Secondary | ICD-10-CM

## 2013-07-25 ENCOUNTER — Ambulatory Visit (INDEPENDENT_AMBULATORY_CARE_PROVIDER_SITE_OTHER): Payer: BC Managed Care – PPO | Admitting: Orthopedic Surgery

## 2013-07-25 ENCOUNTER — Ambulatory Visit (INDEPENDENT_AMBULATORY_CARE_PROVIDER_SITE_OTHER): Payer: BC Managed Care – PPO

## 2013-07-25 VITALS — BP 166/100 | Ht 66.0 in | Wt 210.0 lb

## 2013-07-25 DIAGNOSIS — M25562 Pain in left knee: Secondary | ICD-10-CM

## 2013-07-25 DIAGNOSIS — M25569 Pain in unspecified knee: Secondary | ICD-10-CM

## 2013-07-25 NOTE — Patient Instructions (Addendum)
Ice knee, take tylenol  Watch for cacthing, locking, giving away    Knee, Cartilage (Meniscus) Injury It is suspected that you have a torn cartilage (meniscus) in your knee. The menisci are made of tough cartilage, and fit between the surfaces of the thigh and leg bones. The menisci are "C"shaped and have a wedged profile. The wedged profile helps the stability of the joint by keeping the rounded femur surface from sliding off the flat tibial surface. The menisci are fed (nourished) by small blood vessels; but there is also a large area at the inner edge of the meniscus that does not have a good blood supply (avascular). This presents a problem when there is an injury to the meniscus, because areas without good blood supply heal poorly. As a result when there is a torn cartilage in the knee, surgery is often required to fix it. This is usually done with a surgical procedure less invasive than open surgery (arthroscopy). Some times open surgery of the knee is required if there is other damage. PURPOSE OF THE MENISCUS The medial meniscus rests on the medial tibial plateau. The tibia is the large bone in your lower leg (the shin bone). The medial tibial plateau is the upper end of the bone making up the inner part of your knee. The lateral meniscus serves the same purpose and is located on the outside of the knee. The menisci help to distribute your body weight across the knee joint; they act as shock absorbers. Without the meniscus present, the weight of your body would be unevenly applied to the bones in your legs (the femur and tibia). The femur is the large bone in your thigh. This uneven weight distribution would cause increased wear and tear on the cartilage lining the joint surfaces, leading to early damage (arthritis) of these areas. The presence of the menisci cartilage is necessary for a healthy knee. PURPOSE OF THE KNEE CARTILAGE The knee joint is made up of three bones: the thigh bone (femur), the  shin bone (tibia), and the knee cap (patella). The surfaces of these bones at the knee joint are covered with cartilage called articular cartilage. This smooth slippery surface allows the bones to slide against each other without causing bone damage. The meniscus sits between these cartilaginous surfaces of the bones. It distributes the weight evenly in the joints and helps with the stability of the joint (keeps the joint steady). HOME CARE INSTRUCTIONS  Use crutches and external braces as instructed.  Once home an ice pack applied to your injured knee may help with discomfort and keep the swelling down. An ice pack can be used for the first couple of days or as instructed.  Only take over-the-counter or prescription medicines for pain, discomfort, or fever as directed by your caregiver.  Call if you do not have relief of pain with medications or if there is increasing in pain.  Call if your foot becomes cold or blue.  You may resume normal diet and activities as directed.  Make sure to keep your appointment with your follow-up caregiver. This injury may require further evaluation and treatment beyond the temporary treatment given today. Document Released: 05/09/2002 Document Revised: 05/11/2011 Document Reviewed: 08/31/2008 [facility] Patient Information 2014 [place], [place].  Knee Exercises EXERCISES RANGE OF MOTION(ROM) AND STRETCHING EXERCISES These exercises may help you when beginning to rehabilitate your injury. Your symptoms may resolve with or without further involvement from your physician, physical therapist or athletic trainer. While completing these exercises, remember:  Restoring tissue flexibility helps normal motion to return to the joints. This allows healthier, less painful movement and activity.   An effective stretch should be held for at least 30 seconds.   A stretch should never be painful. You should only feel a gentle lengthening or release in the stretched tissue.   STRETCH - Knee Extension, Prone  Lie on your stomach on a firm surface, such as a bed or countertop. Place your right / left knee and leg just beyond the edge of the surface. You may wish to place a towel under the far end of your right / left thigh for comfort.   Relax your leg muscles and allow gravity to straighten your knee. Your clinician may advise you to add an ankle weight if more resistance is helpful for you.  You should feel a stretch in the back of your right / left knee.  RANGE OF MOTION - Knee Flexion, Active  Lie on your back with both knees straight. (If this causes back discomfort, bend your opposite knee, placing your foot flat on the floor.)   Slowly slide your heel back toward your buttocks until you feel a gentle stretch in the front of your knee or thigh.     STRETCH - Quadriceps, Prone   Lie on your stomach on a firm surface, such as a bed or padded floor.   Bend your right / left knee and grasp your ankle. If you are unable to reach, your ankle or pant leg, use a belt around your foot to lengthen your reach.   Gently pull your heel toward your buttocks. Your knee should not slide out to the side. You should feel a stretch in the front of your thigh and/or knee.   STRETCH  Hamstrings, Supine   Lie on your back. Loop a belt or towel over the ball of your right / left foot.   Straighten your right / left knee and slowly pull on the belt to raise your leg. Do not allow the right / left knee to bend. Keep your opposite leg flat on the floor.  Raise the leg until you feel a gentle stretch behind your right / left knee or thigh.  STRENGTHENING EXERCISES These exercises may help you when beginning to rehabilitate your injury. They may resolve your symptoms with or without further involvement from your physician, physical therapist or athletic trainer. While completing these exercises, remember:    Muscles can gain both the endurance and the strength needed for everyday  activities through controlled exercises.   Complete these exercises as instructed by your physician, physical therapist or athletic trainer. Progress the resistance and repetitions only as guided.   You may experience muscle soreness or fatigue, but the pain or discomfort you are trying to eliminate should never worsen during these exercises. If this pain does worsen, stop and make certain you are following the directions exactly. If the pain is still present after adjustments, discontinue the exercise until you can discuss the trouble with your clinician.  STRENGTH - Quadriceps, Isometrics  Lie on your back with your right / left leg extended and your opposite knee bent.   Gradually tense the muscles in the front of your right / left thigh. You should see either your knee cap slide up toward your hip or increased dimpling just above the knee. This motion will push the back of the knee down toward the floor/mat/bed on which you are lying.   STRENGTH - Quadriceps, Short  Arcs   Lie on your back. Place a rolled  towel roll under your knee so that the knee slightly bends.   Raise only your lower leg by tightening the muscles in the front of your thigh. Do not allow your thigh to rise.     STRENGTH - Quadriceps, Straight Leg Raises  Quality counts! Watch for signs that the quadriceps muscle is working to insure you are strengthening the correct muscles and not "cheating" by substituting with healthier muscles.  Lay on your back with your right / left leg extended and your opposite knee bent.   Tense the muscles in the front of your right / left thigh. You should see either your knee cap slide up or increased dimpling just above the knee. Your thigh may even quiver.  Tighten these muscles even more and raise your leg 4 to 6 inches off the floor.   STRENGTH - Hamstring, Curls  Lay on your stomach with your legs extended. (If you lay on a bed, your feet may hang over the edge.)   Tighten the  muscles in the back of your thigh to bend your right / left knee up to 90 degrees. Keep your hips flat on the bed/floor.    STRENGTH  Quadriceps, Squats  Stand in a door frame so that your feet and knees are in line with the frame.   Use your hands for balance, not support, on the frame.   Slowly lower your weight, bending at the hips and knees. Keep your lower legs upright so that they are parallel with the door frame. Squat only within the range that does not increase your knee pain. Never let your hips drop below your knees.   Slowly return upright, pushing with your legs, not pulling with your hands.   STRENGTH - Quadriceps, Wall Slides  Follow guidelines for form closely. Increased knee pain often results from poorly placed feet or knees.  Lean against a smooth wall or door and walk your feet out 18-24 inches. Place your feet hip-width apart.   Slowly slide down the wall or door until your knees bend _30________ degrees.* Keep your knees over your heels, not your toes, and in line with your hips, not falling to either side.   * Your physician, physical therapist or athletic trainer will alter this angle based on your symptoms and progress. Document Released: 12/31/2004 Document Revised: 05/11/2011 Document Reviewed: 05/31/2008 [facility] Patient Information 2013 [place].

## 2013-07-25 NOTE — Progress Notes (Signed)
Patient ID: Lori Mullins, female   DOB: 1965-04-01, 48 y.o.   MRN: [id number] Chief Complaint  Patient presents with  . Knee Pain    Left knee pain, no injury.    HISTORY: 48 year old female who presents with pain in the left knee over the medial aspect for 6 weeks after twisting her knee while getting in her bed. She is able walk 3 miles a day. She complains a 4/10 constant medial dull pain over the medial joint line but denies catching locking or giving way. She has modified her activities to accommodate her knee pain. Hypertension and arthritis her previous medical history past surgery discectomy at C4 and 5 with fusion and she had retinal surgery in 2005  Multivitamins calcium tramadol when necessary Xanax and Flexeril when necessary medications  Allergies none  Family history of hypertension arthritis  Both parents and her 56 year old sibling are alive  System review recorded reviewed and signed 07/25/2013 at 4:30 PM no significant findings related to this illness  Vital signs: BP 166/100  Ht 5\' 6"  (1.676 m)  Wt 210 lb (95.255 kg)  BMI 33.91 kg/m2   General the patient is well-developed and well-nourished grooming and hygiene are normal Oriented x3 Mood and affect normal Ambulation normal Inspection of the right knee for comparison Full range of motion All joints are stable Motor exam is normal Skin clean dry and intact  Left knee medial joint line tenderness positive McMurray sign positive screw home test ligaments are stable range of motion is normal but she does have pain with hyperflexion medial joint line. Motor exam is normal skin is clean dry and intact pulses are good sensation is normal   X-rays no acute findings  Probable medial meniscal tear without mechanical symptoms  Recommend continue activities as tolerated knee exercises to strengthen the knee continue walking as tolerated  If she gets mechanical symptoms of catching locking or giving way which were  explained to her she should call us for reevaluation and discussion of arthroscopic surgery.

## 2013-08-01 ENCOUNTER — Encounter: Payer: Self-pay | Admitting: Family Medicine

## 2013-08-07 ENCOUNTER — Ambulatory Visit (HOSPITAL_COMMUNITY)
Admission: RE | Admit: 2013-08-07 | Discharge: 2013-08-07 | Disposition: A | Discharge status: Home / Self Care | Payer: BC Managed Care – PPO | Source: Ambulatory Visit | Attending: Obstetrics & Gynecology | Admitting: Obstetrics & Gynecology

## 2013-08-07 DIAGNOSIS — Z1231 Encounter for screening mammogram for malignant neoplasm of breast: Secondary | ICD-10-CM | POA: Insufficient documentation

## 2013-08-07 DIAGNOSIS — R928 Other abnormal and inconclusive findings on diagnostic imaging of breast: Secondary | ICD-10-CM | POA: Insufficient documentation

## 2013-08-07 DIAGNOSIS — Z139 Encounter for screening, unspecified: Secondary | ICD-10-CM

## 2013-08-09 ENCOUNTER — Other Ambulatory Visit: Payer: Self-pay | Admitting: Obstetrics & Gynecology

## 2013-08-09 DIAGNOSIS — R928 Other abnormal and inconclusive findings on diagnostic imaging of breast: Secondary | ICD-10-CM

## 2013-08-13 ENCOUNTER — Other Ambulatory Visit: Payer: Self-pay | Admitting: Family Medicine

## 2013-08-14 ENCOUNTER — Telehealth: Payer: Self-pay | Admitting: *Deleted

## 2013-08-14 MED ORDER — HYDROCODONE-ACETAMINOPHEN 5-325 MG PO TABS: 1.0000 | ORAL_TABLET | ORAL | Status: DC | PRN

## 2013-08-14 MED ORDER — ALPRAZOLAM 1 MG PO TABS: ORAL_TABLET | ORAL | Status: DC

## 2013-08-14 NOTE — Telephone Encounter (Signed)
Last Rf 05/02/13 #30 Last OV same day  OK refill?

## 2013-08-14 NOTE — Telephone Encounter (Signed)
Okay to refill? 

## 2013-08-14 NOTE — Telephone Encounter (Signed)
Ok to refill??  Last office visit/ refill 05/02/2013, #2 refills.

## 2013-08-14 NOTE — Telephone Encounter (Signed)
Medication called to pharmacy. 

## 2013-08-21 ENCOUNTER — Other Ambulatory Visit: Payer: Self-pay | Admitting: Obstetrics & Gynecology

## 2013-08-21 ENCOUNTER — Ambulatory Visit
Admission: RE | Admit: 2013-08-21 | Discharge: 2013-08-21 | Disposition: A | Discharge status: Home / Self Care | Payer: BC Managed Care – PPO | Source: Ambulatory Visit | Attending: Obstetrics & Gynecology | Admitting: Obstetrics & Gynecology

## 2013-08-21 DIAGNOSIS — R921 Mammographic calcification found on diagnostic imaging of breast: Secondary | ICD-10-CM

## 2013-08-21 DIAGNOSIS — R928 Other abnormal and inconclusive findings on diagnostic imaging of breast: Secondary | ICD-10-CM

## 2013-08-30 ENCOUNTER — Ambulatory Visit
Admission: RE | Admit: 2013-08-30 | Discharge: 2013-08-30 | Disposition: A | Discharge status: Home / Self Care | Payer: BC Managed Care – PPO | Source: Ambulatory Visit | Attending: Obstetrics & Gynecology | Admitting: Obstetrics & Gynecology

## 2013-08-30 ENCOUNTER — Other Ambulatory Visit: Payer: Self-pay | Admitting: Obstetrics & Gynecology

## 2013-08-30 DIAGNOSIS — R921 Mammographic calcification found on diagnostic imaging of breast: Secondary | ICD-10-CM

## 2013-09-06 ENCOUNTER — Encounter (HOSPITAL_COMMUNITY): Payer: BC Managed Care – PPO

## 2013-09-11 ENCOUNTER — Other Ambulatory Visit: Payer: Self-pay | Admitting: Family Medicine

## 2013-09-11 NOTE — Telephone Encounter (Signed)
Ok to refill??  Last office visit/ refill 05/02/2013, #2 refills.

## 2013-09-12 NOTE — Telephone Encounter (Signed)
Medication called to pharmacy. 

## 2013-09-12 NOTE — Telephone Encounter (Signed)
okay

## 2013-10-11 ENCOUNTER — Other Ambulatory Visit: Payer: Self-pay | Admitting: Family Medicine

## 2013-10-11 NOTE — Telephone Encounter (Signed)
Ok to refill??  Last office visit 05/02/2013.  Last refill Flexeril 06/13/2013, #3 refills.   Last refill Tramadol 09/12/2013.

## 2013-10-11 NOTE — Telephone Encounter (Signed)
okay

## 2013-10-11 NOTE — Telephone Encounter (Signed)
Medication called to pharmacy. 

## 2013-11-03 ENCOUNTER — Ambulatory Visit (INDEPENDENT_AMBULATORY_CARE_PROVIDER_SITE_OTHER): Payer: BC Managed Care – PPO | Admitting: Family Medicine

## 2013-11-03 ENCOUNTER — Encounter: Payer: Self-pay | Admitting: Family Medicine

## 2013-11-03 VITALS — BP 142/78 | HR 78 | Temp 98.5°F | Resp 12 | Ht 66.5 in | Wt 207.0 lb

## 2013-11-03 DIAGNOSIS — F411 Generalized anxiety disorder: Secondary | ICD-10-CM

## 2013-11-03 DIAGNOSIS — E669 Obesity, unspecified: Secondary | ICD-10-CM

## 2013-11-03 DIAGNOSIS — M159 Polyosteoarthritis, unspecified: Secondary | ICD-10-CM

## 2013-11-03 DIAGNOSIS — F419 Anxiety disorder, unspecified: Secondary | ICD-10-CM

## 2013-11-03 DIAGNOSIS — M8949 Other hypertrophic osteoarthropathy, multiple sites: Secondary | ICD-10-CM

## 2013-11-03 DIAGNOSIS — M150 Primary generalized (osteo)arthritis: Secondary | ICD-10-CM

## 2013-11-03 DIAGNOSIS — I10 Essential (primary) hypertension: Secondary | ICD-10-CM

## 2013-11-03 MED ORDER — HYDROCODONE-ACETAMINOPHEN 5-325 MG PO TABS: 1.0000 | ORAL_TABLET | ORAL | Status: DC | PRN

## 2013-11-03 MED ORDER — DICLOFENAC SODIUM 1 % TD GEL: 2.0000 g | Freq: Four times a day (QID) | TRANSDERMAL | Status: DC

## 2013-11-03 MED ORDER — CYCLOBENZAPRINE HCL 10 MG PO TABS: ORAL_TABLET | ORAL | Status: DC

## 2013-11-03 NOTE — Assessment & Plan Note (Signed)
Pain meds refilled voltaren gel

## 2013-11-03 NOTE — Progress Notes (Signed)
Patient ID: NATACIA CHAISSON, female   DOB: 12/04/1965, 48 y.o.   MRN: [id number]   Subjective:    Patient ID: [name], female    DOB: 04/01/1965, 48 y.o.   MRN: [id number]  Patient presents for 6 month F/U  Patient here to follow chronic medical problems. She has no new specific concerns. She's tolerating her medications without any difficulties. Her more recent blood pressures have been in the 970 systolic less than 80 diastolic. He is doing well with her Xanax and controlling her anxiety. She still walking and try to do some exercise including weight lifting on a regular basis. She requests medication refills   Review Of Systems:  GEN- denies fatigue, fever, weight loss,weakness, recent illness HEENT- denies eye drainage, change in vision, nasal discharge, CVS- denies chest pain, palpitations RESP- denies SOB, cough, wheeze ABD- denies N/V, change in stools, abd pain GU- denies dysuria, hematuria, dribbling, incontinence MSK- denies joint pain, muscle aches, injury Neuro- denies headache, dizziness, syncope, seizure activity       Objective:    BP 142/78  Pulse 78  Temp(Src) 98.5 F (36.9 C) (Oral)  Resp 12  Ht 5' 6.5" (1.689 m)  Wt 207 lb (93.895 kg)  BMI 32.91 kg/m2 GEN- NAD, alert and oriented x3 HEENT- PERRL, EOMI, non injected sclera, pink conjunctiva, MMM, oropharynx clear Neck- Supple, no thyromegaly CVS- RRR, no murmur RESP-CTAB ABD-NABS,soft,NT,ND EXT- No edema Pulses- Radial, DP- 2+        Assessment & Plan:      Problem List Items Addressed This Visit   None      Note: This dictation was prepared with Dragon dictation along with smaller phrase technology. Any transcriptional errors that result from this process are unintentional.

## 2013-11-03 NOTE — Assessment & Plan Note (Addendum)
Stable on xanax Typically takes one tablet at bedtime

## 2013-11-03 NOTE — Assessment & Plan Note (Signed)
Well controlled off meds, continue to monitor

## 2013-11-03 NOTE — Patient Instructions (Signed)
Continue current medications Continue checking your blood pressure and send me some results F/U 6 months

## 2013-11-03 NOTE — Assessment & Plan Note (Signed)
Continue to work on diet and exercise for weight loss

## 2013-11-15 ENCOUNTER — Other Ambulatory Visit: Payer: Self-pay | Admitting: Family Medicine

## 2013-11-17 ENCOUNTER — Other Ambulatory Visit: Payer: Self-pay | Admitting: Family Medicine

## 2013-11-17 ENCOUNTER — Encounter: Payer: Self-pay | Admitting: Family Medicine

## 2013-11-17 MED ORDER — ALPRAZOLAM 1 MG PO TABS: ORAL_TABLET | ORAL | Status: DC

## 2013-11-17 MED ORDER — TRAMADOL HCL 50 MG PO TABS: ORAL_TABLET | ORAL | Status: DC

## 2013-11-17 NOTE — Telephone Encounter (Signed)
Medication called to pharmacy. 

## 2013-12-15 ENCOUNTER — Other Ambulatory Visit: Payer: Self-pay | Admitting: Family Medicine

## 2013-12-15 NOTE — Telephone Encounter (Signed)
Prescription sent to pharmacy.

## 2013-12-15 NOTE — Telephone Encounter (Signed)
Ok to refill??  Last office visit/ refill 11/03/2013.

## 2013-12-15 NOTE — Telephone Encounter (Signed)
Okay to refill? 

## 2013-12-24 ENCOUNTER — Encounter: Payer: Self-pay | Admitting: Family Medicine

## 2014-01-12 ENCOUNTER — Other Ambulatory Visit: Payer: Self-pay | Admitting: Family Medicine

## 2014-01-12 NOTE — Telephone Encounter (Signed)
Last Rf 10/16  OK refill?

## 2014-02-11 ENCOUNTER — Other Ambulatory Visit: Payer: Self-pay | Admitting: Family Medicine

## 2014-02-12 MED ORDER — HYDROCODONE-ACETAMINOPHEN 5-325 MG PO TABS: 1.0000 | ORAL_TABLET | ORAL | Status: DC | PRN

## 2014-02-12 NOTE — Telephone Encounter (Signed)
Prescription printed and patient made aware to come to office to pick up via Mychart.

## 2014-02-12 NOTE — Telephone Encounter (Signed)
Okay to refill? 

## 2014-02-12 NOTE — Telephone Encounter (Signed)
Last RF 11/03/13  #30  LOV 11/03/13  OK refill?

## 2014-03-14 ENCOUNTER — Other Ambulatory Visit: Payer: Self-pay | Admitting: Family Medicine

## 2014-03-14 NOTE — Telephone Encounter (Signed)
Both refill 11/17/13 for one month + 2  LOV 11/03/13  OK refill?

## 2014-03-14 NOTE — Telephone Encounter (Signed)
Okay to refill give 3 on both

## 2014-03-15 NOTE — Telephone Encounter (Signed)
Rx's called in . 

## 2014-05-04 ENCOUNTER — Encounter: Payer: Self-pay | Admitting: Family Medicine

## 2014-05-04 ENCOUNTER — Ambulatory Visit (INDEPENDENT_AMBULATORY_CARE_PROVIDER_SITE_OTHER): Payer: BLUE CROSS/BLUE SHIELD | Admitting: Family Medicine

## 2014-05-04 VITALS — BP 148/80 | HR 76 | Temp 98.3°F | Resp 12 | Ht 67.0 in | Wt 205.0 lb

## 2014-05-04 DIAGNOSIS — I10 Essential (primary) hypertension: Secondary | ICD-10-CM

## 2014-05-04 DIAGNOSIS — E669 Obesity, unspecified: Secondary | ICD-10-CM

## 2014-05-04 DIAGNOSIS — M8949 Other hypertrophic osteoarthropathy, multiple sites: Secondary | ICD-10-CM

## 2014-05-04 DIAGNOSIS — D489 Neoplasm of uncertain behavior, unspecified: Secondary | ICD-10-CM | POA: Diagnosis not present

## 2014-05-04 DIAGNOSIS — M159 Polyosteoarthritis, unspecified: Secondary | ICD-10-CM

## 2014-05-04 DIAGNOSIS — F419 Anxiety disorder, unspecified: Secondary | ICD-10-CM | POA: Diagnosis not present

## 2014-05-04 DIAGNOSIS — E162 Hypoglycemia, unspecified: Secondary | ICD-10-CM

## 2014-05-04 DIAGNOSIS — M150 Primary generalized (osteo)arthritis: Secondary | ICD-10-CM | POA: Diagnosis not present

## 2014-05-04 LAB — GLUCOSE, FINGERSTICK (STAT): GLUCOSE, FINGERSTICK: 76 mg/dL (ref 70–99)

## 2014-05-04 MED ORDER — HYDROCODONE-ACETAMINOPHEN 5-325 MG PO TABS: 1.0000 | ORAL_TABLET | ORAL | Status: DC | PRN

## 2014-05-04 MED ORDER — ALPRAZOLAM 1 MG PO TABS: ORAL_TABLET | ORAL | Status: DC

## 2014-05-04 MED ORDER — CYCLOBENZAPRINE HCL 10 MG PO TABS: ORAL_TABLET | ORAL | Status: DC

## 2014-05-04 MED ORDER — TRAMADOL HCL 50 MG PO TABS: 50.0000 mg | ORAL_TABLET | Freq: Two times a day (BID) | ORAL | Status: DC | PRN

## 2014-05-04 NOTE — Patient Instructions (Addendum)
Restart the benicar 5mg   Get labs done fasting Send me email with blood pressures in 2 weeks F/U 6 months for PHYSICAL

## 2014-05-06 NOTE — Assessment & Plan Note (Signed)
Doing well on xanax, refilled today

## 2014-05-06 NOTE — Assessment & Plan Note (Signed)
Restart Benicar 5mg  once a day

## 2014-05-06 NOTE — Assessment & Plan Note (Signed)
Ultram and norco refilled Uses sparingly Continue exercise routine

## 2014-05-06 NOTE — Progress Notes (Signed)
Patient ID: Lori Mullins, female   DOB: [birth year], 49 y.o.   MRN: [id number]   Subjective:    Patient ID: Lori Mullins, female    DOB: August 14, 1965, 49 y.o.   MRN: [id number]  Patient presents for 6 month F/U  1. HTN- she has been monitoring BP , states at home fluctates in 140's, but when at eye doctor office BP was 150/100. Has family history of HTN, has been off benicar for > 6 months now.   2. Low blood sugar, - had CBG checked at some doctors office 2 hours after she had eaten BG had dropped to 74, she was not aware, she has been trying to eat small snacks and add honey throughout day to keep BS from fluctating, she is concerned and would like this rechecked 3. Mole on left leg- popped up a few months ago, some mild irritation in pants, concerned about skin cancer due to her sun exposure , has had other moles removed which were benign 4. Recent URI - now resolving,she did take a good amount of OTC decongestants    Review Of Systems:  GEN- denies fatigue, fever, weight loss,weakness, recent illness HEENT- denies eye drainage, change in vision, nasal discharge, CVS- denies chest pain, palpitations RESP- denies SOB, +cough, wheeze ABD- denies N/V, change in stools, abd pain GU- denies dysuria, hematuria, dribbling, incontinence MSK- denies joint pain, muscle aches, injury Neuro- denies headache, dizziness, syncope, seizure activity       Objective:    BP 148/80 mmHg  Pulse 76  Temp(Src) 98.3 F (36.8 C) (Oral)  Resp 12  Ht 5\' 7"  (1.702 m)  Wt 205 lb (92.987 kg)  BMI 32.10 kg/m2  LMP  GEN- NAD, alert and oriented x3 HEENT- PERRL, EOMI, non injected sclera, pink conjunctiva, MMM, oropharynx clear, clear rhinorrhea, no maxillary sinus tenderness, TM clear bilat, ear canals clear Neck- Supple, no LAD CVS- RRR, no murmur RESP-CTAB Skin- Left calf-skin toned raised scaly lesion with surrounding erythematous ring, NT  ( size < 0.5cm) EXT- No edema Pulses-  Radial2+   Procedure- Mole Removal/shave biopsy Procedure explained to patient questions answered benefits and risks discussed verbal consent obtained. Antiseptic- betagdine Anesthesia-lidocaine 1% with epi Shave biopsy Drysol q tip x 2 applied to biopsy site with good hemostasis Patient tolerated procedure well Bandage applied Pathology sent      Assessment & Plan:      Problem List Items Addressed This Visit      Unprioritized   Obesity   Relevant Orders   Comprehensive metabolic panel   Hemoglobin A1c   OA (osteoarthritis)   Relevant Medications   HYDROcodone-acetaminophen (NORCO/VICODIN) 5-325 MG per tablet   traMADol (ULTRAM) 50 MG tablet   cyclobenzaprine (FLEXERIL) tablet   Essential hypertension, benign - Primary   Relevant Medications   olmesartan (BENICAR) 5 MG tablet   Other Relevant Orders   CBC with Differential/Platelet   Comprehensive metabolic panel   Lipid panel    Other Visit Diagnoses    Low blood sugar        Repeat CBG in office todayy a little low for just eating 1 hour ago, A1C to be checked as well with fasting labs    Relevant Orders    Glucose, fingerstick (stat) (Completed)    Hemoglobin A1c    Neoplasm of uncertain behavior        Biopsy sent to pathology, routine care of biopsy site discussed, including signs of infection    Relevant Orders  Pathology Report       Note: This dictation was prepared with Dragon dictation along with smaller phrase technology. Any transcriptional errors that result from this process are unintentional.

## 2014-05-08 LAB — PATHOLOGY

## 2014-05-17 ENCOUNTER — Encounter: Payer: Self-pay | Admitting: Family Medicine

## 2014-05-18 MED ORDER — SULFAMETHOXAZOLE-TRIMETHOPRIM 800-160 MG PO TABS: 1.0000 | ORAL_TABLET | Freq: Two times a day (BID) | ORAL | Status: DC

## 2014-07-26 ENCOUNTER — Encounter: Payer: Self-pay | Admitting: Family Medicine

## 2014-07-27 ENCOUNTER — Ambulatory Visit (INDEPENDENT_AMBULATORY_CARE_PROVIDER_SITE_OTHER): Payer: BLUE CROSS/BLUE SHIELD | Admitting: Family Medicine

## 2014-07-27 ENCOUNTER — Encounter: Payer: Self-pay | Admitting: Family Medicine

## 2014-07-27 VITALS — BP 118/84 | HR 68 | Temp 97.8°F | Resp 14 | Ht 67.0 in | Wt 205.0 lb

## 2014-07-27 DIAGNOSIS — L03213 Periorbital cellulitis: Secondary | ICD-10-CM

## 2014-07-27 DIAGNOSIS — L03211 Cellulitis of face: Secondary | ICD-10-CM | POA: Diagnosis not present

## 2014-07-27 MED ORDER — SULFAMETHOXAZOLE-TRIMETHOPRIM 800-160 MG PO TABS: 1.0000 | ORAL_TABLET | Freq: Two times a day (BID) | ORAL | Status: DC

## 2014-07-27 NOTE — Progress Notes (Signed)
Subjective:    Patient ID: Lori Mullins, female    DOB: August 02, 1965, 49 y.o.   MRN: [id number]  HPI Reports several days of pain and swelling around right eye.   Skin red and warm and she has developed a stye on her lower right eyelid. Past Medical History  Diagnosis Date  . Hypertension   . Anxiety   . Plantar fasciitis     seen by ortho- Bednar  . Arthritis     Hip/Hands  . Campylobacter intestinal infection 1996    Became systemic  . OCD (obsessive compulsive disorder) 2007   Past Surgical History  Procedure Laterality Date  . Cervical discectomy    . Retinal detachment surgery  2005   Current Outpatient Prescriptions on File Prior to Visit  Medication Sig Dispense Refill  . ALPRAZolam (XANAX) 1 MG tablet TAKE 1 TABLET TWICE DAILY AS NEEDED. 60 tablet 3  . Calcium Carb-Cholecalciferol (CALCIUM-VITAMIN D) 600-400 MG-UNIT TABS Take by mouth 2 (two) times daily.    . cyclobenzaprine (FLEXERIL) 10 MG tablet TAKE 1 TABLET BY MOUTH THREE TIMES DAILY AS NEEDED FOR MUSCLE SPASMS. 45 tablet 3  . diclofenac sodium (VOLTAREN) 1 % GEL Apply 2 g topically 4 (four) times daily. 1 Tube 6  . HYDROcodone-acetaminophen (NORCO/VICODIN) 5-325 MG per tablet Take 1 tablet by mouth every 4 (four) hours as needed. 30 tablet 0  . loratadine (CLARITIN) 10 MG tablet Take 10 mg by mouth daily.    . magnesium gluconate (MAGONATE) 500 MG tablet Take 500 mg by mouth 2 (two) times daily.    . Misc Natural Products (ESTROVEN ENERGY PO) Take by mouth.    . Multiple Vitamins-Minerals (MULTIVITAMIN PO) Take by mouth.    . olmesartan (BENICAR) 5 MG tablet Take 5 mg by mouth daily.    . traMADol (ULTRAM) 50 MG tablet Take 1 tablet (50 mg total) by mouth 2 (two) times daily as needed. 60 tablet 3  . vitamin E 100 UNIT capsule Take by mouth daily.     No current facility-administered medications on file prior to visit.   No Known Allergies History   Social History  . Marital Status: Married    Spouse  Name: N/A  . Number of Children: N/A  . Years of Education: N/A   Occupational History  . Not on file.   Social History Main Topics  . Smoking status: Never Smoker   . Smokeless tobacco: Not on file  . Alcohol Use: Yes     Comment: occasionally  . Drug Use: No  . Sexual Activity: Not on file   Other Topics Concern  . Not on file   Social History Narrative      Review of Systems  All other systems reviewed and are negative.      Objective:   Physical Exam  Eyes: Conjunctivae and EOM are normal. Pupils are equal, round, and reactive to light.  Cardiovascular: Normal rate, regular rhythm and normal heart sounds.   Pulmonary/Chest: Effort normal and breath sounds normal.  Skin: Skin is warm. There is erythema.  Vitals reviewed. patient has no pain with extraocular movement. There is mild erythema on the right lower eyelid and the surrounding skin. There is also a stye on the right lower eyelid        Assessment & Plan:  Periorbital cellulitis of right eye - Plan: sulfamethoxazole-trimethoprim (BACTRIM DS,SEPTRA DS) 800-160 MG per tablet  Begin Bactrim double strength tablets 1 by mouth twice a day  for 7 days. Apply warm compresses 3 times a day. Recheck early next week or immediately if worse.

## 2014-08-16 ENCOUNTER — Other Ambulatory Visit: Payer: Self-pay | Admitting: Family Medicine

## 2014-08-16 NOTE — Telephone Encounter (Signed)
Prescription sent to pharmacy.

## 2014-08-16 NOTE — Telephone Encounter (Signed)
Ok to refill 

## 2014-08-16 NOTE — Telephone Encounter (Signed)
Okay give 3 refills 

## 2014-08-19 ENCOUNTER — Other Ambulatory Visit: Payer: Self-pay | Admitting: Family Medicine

## 2014-08-20 MED ORDER — HYDROCODONE-ACETAMINOPHEN 5-325 MG PO TABS: 1.0000 | ORAL_TABLET | ORAL | Status: DC | PRN

## 2014-09-14 ENCOUNTER — Other Ambulatory Visit: Payer: Self-pay | Admitting: Family Medicine

## 2014-09-14 NOTE — Telephone Encounter (Signed)
Medication refilled per protocol. 

## 2014-09-14 NOTE — Telephone Encounter (Signed)
ok 

## 2014-09-14 NOTE — Telephone Encounter (Signed)
Ok x 2  

## 2014-09-14 NOTE — Telephone Encounter (Signed)
Ok to refill??  Last office visit 07/27/2014.  Last refill 05/24/2014, #3 refills.

## 2014-09-17 ENCOUNTER — Telehealth: Payer: Self-pay | Admitting: Family Medicine

## 2014-09-17 MED ORDER — OLMESARTAN MEDOXOMIL 5 MG PO TABS: 5.0000 mg | ORAL_TABLET | Freq: Every day | ORAL | Status: DC

## 2014-09-17 NOTE — Telephone Encounter (Signed)
[phone] Pt is needing a refill on olmesartan (BENICAR) 5 MG tablet [employer]

## 2014-09-17 NOTE — Telephone Encounter (Signed)
Prescription sent to pharmacy.

## 2014-09-18 ENCOUNTER — Encounter: Payer: Self-pay | Admitting: Family Medicine

## 2014-10-02 LAB — CBC WITH DIFFERENTIAL/PLATELET
BASOS PCT: 1 % (ref 0–1)
Basophils Absolute: 0.1 10*3/uL (ref 0.0–0.1)
Eosinophils Absolute: 0.3 10*3/uL (ref 0.0–0.7)
Eosinophils Relative: 4 % (ref 0–5)
HEMATOCRIT: 40.7 % (ref 36.0–46.0)
Hemoglobin: 13.8 g/dL (ref 12.0–15.0)
Lymphocytes Relative: 24 % (ref 12–46)
Lymphs Abs: 1.6 10*3/uL (ref 0.7–4.0)
MCH: 30.8 pg (ref 26.0–34.0)
MCHC: 33.9 g/dL (ref 30.0–36.0)
MCV: 90.8 fL (ref 78.0–100.0)
MONO ABS: 0.5 10*3/uL (ref 0.1–1.0)
MPV: 9.9 fL (ref 8.6–12.4)
Monocytes Relative: 8 % (ref 3–12)
NEUTROS ABS: 4.1 10*3/uL (ref 1.7–7.7)
Neutrophils Relative %: 63 % (ref 43–77)
Platelets: 229 10*3/uL (ref 150–400)
RBC: 4.48 MIL/uL (ref 3.87–5.11)
RDW: 13.2 % (ref 11.5–15.5)
WBC: 6.5 10*3/uL (ref 4.0–10.5)

## 2014-10-02 LAB — HEMOGLOBIN A1C
HEMOGLOBIN A1C: 5.3 % (ref <5.7)
MEAN PLASMA GLUCOSE: 105 mg/dL (ref <117)

## 2014-10-03 LAB — COMPREHENSIVE METABOLIC PANEL
ALT: 15 U/L (ref 6–29)
AST: 15 U/L (ref 10–35)
Albumin: 3.8 g/dL (ref 3.6–5.1)
Alkaline Phosphatase: 60 U/L (ref 33–130)
BUN: 13 mg/dL (ref 7–25)
CALCIUM: 8.8 mg/dL (ref 8.6–10.4)
CO2: 26 mmol/L (ref 20–31)
CREATININE: 0.68 mg/dL (ref 0.50–1.05)
Chloride: 107 mmol/L (ref 98–110)
Glucose, Bld: 86 mg/dL (ref 70–99)
Potassium: 4.4 mmol/L (ref 3.5–5.3)
Sodium: 141 mmol/L (ref 135–146)
Total Bilirubin: 0.3 mg/dL (ref 0.2–1.2)
Total Protein: 5.9 g/dL — ABNORMAL LOW (ref 6.1–8.1)

## 2014-10-03 LAB — LIPID PANEL
CHOLESTEROL: 159 mg/dL (ref 125–200)
HDL: 58 mg/dL (ref >=46)
LDL Cholesterol: 79 mg/dL (ref <130)
TRIGLYCERIDES: 112 mg/dL (ref <150)
Total CHOL/HDL Ratio: 2.7 Ratio (ref <=5.0)
VLDL: 22 mg/dL (ref <30)

## 2014-10-04 ENCOUNTER — Other Ambulatory Visit (HOSPITAL_COMMUNITY): Payer: Self-pay | Admitting: Obstetrics & Gynecology

## 2014-10-04 DIAGNOSIS — R921 Mammographic calcification found on diagnostic imaging of breast: Secondary | ICD-10-CM

## 2014-10-08 ENCOUNTER — Other Ambulatory Visit: Payer: BLUE CROSS/BLUE SHIELD

## 2014-10-16 ENCOUNTER — Inpatient Hospital Stay (HOSPITAL_COMMUNITY): Admission: RE | Admit: 2014-10-16 | Payer: Self-pay | Source: Ambulatory Visit

## 2014-10-16 ENCOUNTER — Other Ambulatory Visit: Payer: Self-pay | Admitting: Family Medicine

## 2014-10-16 DIAGNOSIS — C50912 Malignant neoplasm of unspecified site of left female breast: Secondary | ICD-10-CM

## 2014-10-25 ENCOUNTER — Other Ambulatory Visit: Payer: Self-pay | Admitting: Family Medicine

## 2014-10-25 MED ORDER — OLMESARTAN MEDOXOMIL 5 MG PO TABS: 5.0000 mg | ORAL_TABLET | Freq: Every day | ORAL | Status: DC

## 2014-10-25 MED ORDER — HYDROCODONE-ACETAMINOPHEN 5-325 MG PO TABS: 1.0000 | ORAL_TABLET | ORAL | Status: DC | PRN

## 2014-10-25 NOTE — Telephone Encounter (Signed)
LRF 08/20/14 #30  LOV 07/27/14  OK refill?

## 2014-10-25 NOTE — Telephone Encounter (Signed)
Okay to refill? 

## 2014-10-25 NOTE — Telephone Encounter (Signed)
Left pt message can pick up tomorrow after 10AM

## 2014-10-26 ENCOUNTER — Telehealth: Payer: Self-pay | Admitting: Family Medicine

## 2014-10-26 MED ORDER — LOSARTAN POTASSIUM 25 MG PO TABS: 25.0000 mg | ORAL_TABLET | Freq: Every day | ORAL | Status: DC

## 2014-10-26 NOTE — Telephone Encounter (Signed)
Call placed to patient. LMTRC.  

## 2014-10-26 NOTE — Addendum Note (Signed)
Addended by: Sheral Flow on: 10/26/2014 02:21 PM   Modules accepted: Orders, Medications

## 2014-10-26 NOTE — Telephone Encounter (Signed)
Call patient received a form from her insurance about the cost of her Benicar we will need to change his to generic losartan which is in the same class of medication but it is much cheaper. Her Benicar is almost $400.  If she has enough of the Benicar she can wait until we discusses that her physical in September if she is running out and I recommend changing to losartan 25 mg once a day

## 2014-10-29 NOTE — Telephone Encounter (Signed)
Call placed to patient and patient made aware.  

## 2014-11-07 ENCOUNTER — Ambulatory Visit (INDEPENDENT_AMBULATORY_CARE_PROVIDER_SITE_OTHER): Payer: BLUE CROSS/BLUE SHIELD | Admitting: Family Medicine

## 2014-11-07 ENCOUNTER — Encounter: Payer: Self-pay | Admitting: Family Medicine

## 2014-11-07 VITALS — BP 130/72 | HR 72 | Temp 98.3°F | Resp 14 | Ht 67.0 in | Wt 209.0 lb

## 2014-11-07 DIAGNOSIS — J0100 Acute maxillary sinusitis, unspecified: Secondary | ICD-10-CM | POA: Diagnosis not present

## 2014-11-07 DIAGNOSIS — J209 Acute bronchitis, unspecified: Secondary | ICD-10-CM | POA: Diagnosis not present

## 2014-11-07 MED ORDER — AMOXICILLIN-POT CLAVULANATE 875-125 MG PO TABS: 1.0000 | ORAL_TABLET | Freq: Two times a day (BID) | ORAL | Status: DC

## 2014-11-07 MED ORDER — GUAIFENESIN-CODEINE 100-10 MG/5ML PO SOLN: 5.0000 mL | Freq: Four times a day (QID) | ORAL | Status: DC | PRN

## 2014-11-07 NOTE — Progress Notes (Signed)
Patient ID: Lori Mullins, female   DOB: 06/16/1965, 49 y.o.   MRN: [id number]   Subjective:    Patient ID: Lori Mullins, female    DOB: 1965/05/03, 49 y.o.   MRN: [id number]  Patient presents for Illness   agent here with productive cough sore throat swollen lymph nodes headache which is been going on for about 4 weeks now. Initially started with more than a cough which was dry she felt she was getting better with over-the-counter medications however about 2 weeks ago she did make contact drainage swollen lymph nodes and sinus pressure. She is use decongests as well as Netty pot recently. She's not had any fever. No known sick contacts.   Review Of Systems:  GEN- denies fatigue, fever, weight loss,weakness, recent illness HEENT- denies eye drainage, change in vision,+ nasal discharge, CVS- denies chest pain, palpitations RESP- denies SOB,+ cough, wheeze ABD- denies N/V, change in stools, abd pain GU- denies dysuria, hematuria, dribbling, incontinence MSK- denies joint pain, muscle aches, injury Neuro- denies headache, dizziness, syncope, seizure activity       Objective:    BP 130/72 mmHg  Pulse 72  Temp(Src) 98.3 F (36.8 C) (Oral)  Resp 14  Ht 5\' 7"  (1.702 m)  Wt 209 lb (94.802 kg)  BMI 32.73 kg/m2 GEN- NAD, alert and oriented x3 HEENT- PERRL, EOMI, non injected sclera, pink conjunctiva, MMM, oropharynx mild injection, TM clear bilat no effusion,  + maxillary sinus tenderness, inflammed turbinates,  Nasal drainage  Neck- Supple, no LAD CVS- RRR, no murmur RESP-course BS, no wheeze, normal WOB  EXT- No edema Pulses- Radial 2+          Assessment & Plan:      Problem List Items Addressed This Visit    None    Visit Diagnoses    Acute maxillary sinusitis, recurrence not specified    -  Primary    Treat with augmentin x 10 days, netty pot, decongestant, cough syrup for the bronchitis    Relevant Medications    amoxicillin-clavulanate (AUGMENTIN) 875-125 MG  per tablet    guaiFENesin-codeine 100-10 MG/5ML syrup    Acute bronchitis, unspecified organism           Note: This dictation was prepared with Dragon dictation along with smaller phrase technology. Any transcriptional errors that result from this process are unintentional.

## 2014-11-07 NOTE — Patient Instructions (Signed)
Take antibiotics as prescribed Use decongestant Cough medicine sent in  F/U as needed

## 2014-11-13 ENCOUNTER — Encounter (HOSPITAL_COMMUNITY): Payer: Self-pay

## 2014-11-14 ENCOUNTER — Other Ambulatory Visit: Payer: Self-pay | Admitting: Family Medicine

## 2014-11-15 NOTE — Telephone Encounter (Signed)
Refill appropriate and filled per protocol. 

## 2014-11-15 NOTE — Telephone Encounter (Signed)
Ok to refill??  Last office visit 11/07/2014.  Last refill 09/14/2014, #2 refills.

## 2014-11-16 ENCOUNTER — Encounter: Payer: BLUE CROSS/BLUE SHIELD | Admitting: Family Medicine

## 2014-11-16 ENCOUNTER — Encounter: Payer: Self-pay | Admitting: *Deleted

## 2014-11-16 NOTE — Telephone Encounter (Signed)
okay

## 2014-11-16 NOTE — Telephone Encounter (Signed)
Medication refilled per protocol. 

## 2014-12-17 ENCOUNTER — Other Ambulatory Visit: Payer: Self-pay | Admitting: Family Medicine

## 2014-12-17 NOTE — Telephone Encounter (Signed)
Ok to refill??  Last office visit 11/07/2014.  Last refill 09/14/2014, #2 refills.

## 2014-12-17 NOTE — Telephone Encounter (Signed)
Okay to refill? 

## 2014-12-18 NOTE — Telephone Encounter (Signed)
Medication called to pharmacy. 

## 2014-12-21 ENCOUNTER — Encounter: Payer: Self-pay | Admitting: Family Medicine

## 2014-12-21 ENCOUNTER — Ambulatory Visit (INDEPENDENT_AMBULATORY_CARE_PROVIDER_SITE_OTHER): Payer: BLUE CROSS/BLUE SHIELD | Admitting: Family Medicine

## 2014-12-21 VITALS — BP 128/76 | HR 70 | Temp 98.1°F | Resp 16 | Ht 67.0 in | Wt 203.0 lb

## 2014-12-21 DIAGNOSIS — Z113 Encounter for screening for infections with a predominantly sexual mode of transmission: Secondary | ICD-10-CM | POA: Diagnosis not present

## 2014-12-21 DIAGNOSIS — Z124 Encounter for screening for malignant neoplasm of cervix: Secondary | ICD-10-CM

## 2014-12-21 DIAGNOSIS — E669 Obesity, unspecified: Secondary | ICD-10-CM | POA: Diagnosis not present

## 2014-12-21 DIAGNOSIS — I10 Essential (primary) hypertension: Secondary | ICD-10-CM

## 2014-12-21 DIAGNOSIS — Z23 Encounter for immunization: Secondary | ICD-10-CM | POA: Diagnosis not present

## 2014-12-21 DIAGNOSIS — Z1239 Encounter for other screening for malignant neoplasm of breast: Secondary | ICD-10-CM | POA: Diagnosis not present

## 2014-12-21 DIAGNOSIS — Z0000 Encounter for general adult medical examination without abnormal findings: Secondary | ICD-10-CM | POA: Diagnosis not present

## 2014-12-21 DIAGNOSIS — Z1159 Encounter for screening for other viral diseases: Secondary | ICD-10-CM

## 2014-12-21 LAB — WET PREP FOR TRICH, YEAST, CLUE
Clue Cells Wet Prep HPF POC: NONE SEEN
Trich, Wet Prep: NONE SEEN
YEAST WET PREP: NONE SEEN

## 2014-12-21 MED ORDER — HYDROCODONE-ACETAMINOPHEN 5-325 MG PO TABS: 1.0000 | ORAL_TABLET | ORAL | Status: DC | PRN

## 2014-12-21 NOTE — Assessment & Plan Note (Signed)
Well controlled, no change to meds 

## 2014-12-21 NOTE — Patient Instructions (Signed)
We will set up screening mammogram Continue current medications FLu shot given We will call with lab results Release of records- Dr. Benjie Karvonen ( GYN)  F/U 6 months

## 2014-12-21 NOTE — Progress Notes (Signed)
Patient ID: Lori Mullins, female   DOB: June 24, 1965, 49 y.o.   MRN: [id number]   Subjective:    Patient ID: Lori Mullins, female    DOB: August 22, 1965, 49 y.o.   MRN: [id number]  Patient presents for CPE with PAP and STD Check  Pt here for CPE- recent fasting labs normal. She does have GYN but wants PAP done today with STD screen. Had intercourse with a female she had just met. Had mild vaginal discharge, but also coincided with recent antibiotics so took OTC monistat and symptoms improved.  Would like screening mammogram however because she had abnormal mammo which required biopsy which was benign she was told she needed another Left diagnostic and she declines this after having a benign biopsy.  Declines colonoscopy but will do stool cards  Due for flu shot    Review Of Systems:  GEN- denies fatigue, fever, weight loss,weakness, recent illness HEENT- denies eye drainage, change in vision, nasal discharge, CVS- denies chest pain, palpitations RESP- denies SOB, cough, wheeze ABD- denies N/V, change in stools, abd pain GU- denies dysuria, hematuria, dribbling, incontinence MSK- denies joint pain, muscle aches, injury Neuro- denies headache, dizziness, syncope, seizure activity       Objective:    BP 128/76 mmHg  Pulse 70  Temp(Src) 98.1 F (36.7 C) (Oral)  Resp 16  Ht _0  (1.702 m)  Wt 203 lb (92.08 kg)  BMI 31.79 kg/m2  LMP  GEN- NAD, alert and oriented x3 HEENT- PERRL, EOMI, non injected sclera, pink conjunctiva, MMM, oropharynx clear Neck- Supple, no thyromegaly Breast- normal symmetry, no nipple inversion,no nipple drainage, no nodules or lumps felt Nodes- no axillary nodes CVS- RRR, no murmur RESP-CTAB ABD-NABS,soft,NT,ND GU- normal external genitalia, vaginal mucosa pink and moist, cervix visualized small polyp at  +os blood form os with brush manipulation, minimal thin clear discharge, no CMT, no ovarian masses, uterus normal size,rectum normal tone, FOBT neg EXT-  No edema Pulses- Radial, DP- 2+        Assessment & Plan:      Problem List Items Addressed This Visit    Obesity    Continues to work on diet and lifestyle changes      Essential hypertension, benign - Primary    Well controlled, no change to meds       Other Visit Diagnoses    Screen for STD (sexually transmitted disease)        Relevant Orders    HIV antibody (with reflex)    RPR    WET PREP FOR [place], YEAST, CLUE (Completed)    GC/Chlamydia Probe Amp    Routine general medical examination at a health care facility        CPE Done appears to have small cervical poylp at os, will obtain GYN records, PAP Done, STD screen, will set up screening mammo, Stool cards for colon screening    Cervical cancer screening        Relevant Orders    PAP, ThinPrep ASCUS Rflx HPV Rflx Type    Need for hepatitis C screening test        Relevant Orders    Hepatitis C Antibody       Note: This dictation was prepared with Dragon dictation along with smaller phrase technology. Any transcriptional errors that result from this process are unintentional.

## 2014-12-21 NOTE — Assessment & Plan Note (Signed)
Continues to work on diet and lifestyle changes

## 2014-12-21 NOTE — Addendum Note (Signed)
Addended by: Sheral Flow on: 12/21/2014 04:48 PM   Modules accepted: Orders

## 2014-12-22 LAB — HEPATITIS C ANTIBODY: HCV AB: NEGATIVE

## 2014-12-22 LAB — RPR

## 2014-12-22 LAB — HIV ANTIBODY (ROUTINE TESTING W REFLEX): HIV 1&2 Ab, 4th Generation: NONREACTIVE

## 2014-12-22 LAB — GC/CHLAMYDIA PROBE AMP
CT Probe RNA: NEGATIVE
GC Probe RNA: NEGATIVE

## 2014-12-24 LAB — PAP THINPREP ASCUS RFLX HPV RFLX TYPE

## 2014-12-25 ENCOUNTER — Encounter: Payer: Self-pay | Admitting: *Deleted

## 2014-12-27 ENCOUNTER — Telehealth: Payer: Self-pay | Admitting: *Deleted

## 2014-12-27 ENCOUNTER — Other Ambulatory Visit: Payer: Self-pay | Admitting: Family Medicine

## 2014-12-27 DIAGNOSIS — R921 Mammographic calcification found on diagnostic imaging of breast: Secondary | ICD-10-CM

## 2014-12-27 NOTE — Telephone Encounter (Signed)
Pt is scheduled at [place] on Nov 15 at 3:30pm, pt is aware of appt

## 2014-12-27 NOTE — Telephone Encounter (Signed)
I called pt and informed of the message listed below and she stated that all this a bunch of "BS" and that someone had told her that she would have to pay for the diagnostic because her insurance would not cover. Went into detail as to what the [employer] as informed me as well. Pt decided she did not want to have her mammogram done at [facility] and would like to go someplace else. I informed the pt that I would cancel her appt at [facility] and try to schedule at [place] [facility]. Pt states she appreciate it and I told her I would call her once I get her scheduled.

## 2014-12-27 NOTE — Telephone Encounter (Signed)
-----   Message from [name], MD sent at 12/26/2014  9:52 AM EDT ----- Regarding: RE: Mammogram Please verify this with the patient, as she only wanted the screening and I dont understand why they cant just screen her if, she declines the diagnostic testing  ----- Message -----    From: Maureen Chatters, CMA    Sent: 12/26/2014   8:24 AM      To: [name], MD Subject: RE: Mammogram                                  I called [facility] to see if I could schedule pt for Mammogram and spoke to [place] and she stated that pt had to have a diagnostic d/t when she last had one she was suppose to f/u in 6 mos and never did, I explained to them that she only needed the screening mammo because her insurance will not cover the diagnostic, [name] went to speak to the tech and spoke to him and he said that she is to have a diagnostic on both breast and this will be the last one and then the next time she has to have a mammogram it will the the regular screening. [name] also stated that when putting in the order it needs to be the [facility] 3D mammogram. I also spoke to the insurance lady there and stated that her insurance will pay for it.   ----- Message -----    From: [name], MD    Sent: 12/21/2014   3:48 PM      To: Maureen Chatters, CMA Subject: Mammogram                                         Can you call and schedule pt a mammogram needs, an evening appointment time. She does not want the left diagnostic mammogram follow up exam, her insurance will not cover this. She only wants screening mammogram- her insurance will cover this. They seem to be giving her a hard time at [facility]

## 2015-01-14 ENCOUNTER — Other Ambulatory Visit: Payer: Self-pay | Admitting: Family Medicine

## 2015-01-15 ENCOUNTER — Encounter (HOSPITAL_COMMUNITY): Payer: Self-pay

## 2015-01-15 NOTE — Telephone Encounter (Signed)
okay

## 2015-01-15 NOTE — Telephone Encounter (Signed)
Prescription sent to pharmacy.

## 2015-01-15 NOTE — Telephone Encounter (Signed)
Ok to refill 

## 2015-01-16 ENCOUNTER — Encounter: Payer: Self-pay | Admitting: Family Medicine

## 2015-01-18 ENCOUNTER — Ambulatory Visit
Admission: RE | Admit: 2015-01-18 | Discharge: 2015-01-18 | Disposition: A | Discharge status: Home / Self Care | Payer: BLUE CROSS/BLUE SHIELD | Source: Ambulatory Visit | Attending: Family Medicine | Admitting: Family Medicine

## 2015-01-18 DIAGNOSIS — R921 Mammographic calcification found on diagnostic imaging of breast: Secondary | ICD-10-CM

## 2015-02-14 ENCOUNTER — Encounter: Payer: Self-pay | Admitting: Family Medicine

## 2015-02-15 ENCOUNTER — Encounter: Payer: Self-pay | Admitting: *Deleted

## 2015-02-25 ENCOUNTER — Other Ambulatory Visit: Payer: Self-pay | Admitting: Family Medicine

## 2015-02-26 NOTE — Telephone Encounter (Signed)
OK refill?? 

## 2015-02-27 MED ORDER — CYCLOBENZAPRINE HCL 10 MG PO TABS: 10.0000 mg | ORAL_TABLET | Freq: Three times a day (TID) | ORAL | Status: DC

## 2015-02-27 MED ORDER — HYDROCODONE-ACETAMINOPHEN 5-325 MG PO TABS: 1.0000 | ORAL_TABLET | ORAL | Status: DC | PRN

## 2015-02-27 NOTE — Telephone Encounter (Signed)
Okay to refill both meds 

## 2015-02-27 NOTE — Telephone Encounter (Signed)
Flexeril sent to pharm and hydrocodone printed and left on Dr. Janeann Forehand desk to be signed and put up front - pt is aware of all via mychart

## 2015-03-18 ENCOUNTER — Other Ambulatory Visit: Payer: Self-pay | Admitting: Family Medicine

## 2015-03-18 NOTE — Telephone Encounter (Signed)
Ok to refill??  Last office visit 12/21/2014.  Last refill onTramadol 11/16/2014, #2 refills.   Last refill on Xanax 12/18/2014, #2 refills.   Last refill on Flexeril 02/27/2015.

## 2015-03-18 NOTE — Telephone Encounter (Signed)
ok 

## 2015-03-18 NOTE — Telephone Encounter (Signed)
Refill appropriate and filled per protocol. 

## 2015-03-19 NOTE — Telephone Encounter (Signed)
rx's called in . 

## 2015-03-26 ENCOUNTER — Ambulatory Visit (INDEPENDENT_AMBULATORY_CARE_PROVIDER_SITE_OTHER): Payer: BLUE CROSS/BLUE SHIELD | Admitting: Family Medicine

## 2015-03-26 VITALS — BP 132/62 | HR 80 | Temp 98.1°F | Resp 14 | Ht 67.0 in | Wt 200.5 lb

## 2015-03-26 DIAGNOSIS — L03213 Periorbital cellulitis: Secondary | ICD-10-CM | POA: Diagnosis not present

## 2015-03-26 DIAGNOSIS — R42 Dizziness and giddiness: Secondary | ICD-10-CM | POA: Diagnosis not present

## 2015-03-26 MED ORDER — HYDROCODONE-ACETAMINOPHEN 5-325 MG PO TABS: 1.0000 | ORAL_TABLET | ORAL | Status: DC | PRN

## 2015-03-26 MED ORDER — SULFAMETHOXAZOLE-TRIMETHOPRIM 800-160 MG PO TABS: 1.0000 | ORAL_TABLET | Freq: Two times a day (BID) | ORAL | Status: DC

## 2015-03-26 NOTE — Progress Notes (Signed)
Patient ID: Lori Mullins, female   DOB: 02/19/1966, 50 y.o.   MRN: [id number]   Subjective:    Patient ID: Lori Mullins, female    DOB: 1965/04/28, 50 y.o.   MRN: [id number]  Patient presents for L Eye Issues and HTN   here with swelling of her left eye redness. Yesterday she felt like something may have gotten her eye but she is unclear what. She then noticed some crusting near the tear duct interval redness beneath her lower eyelid. This point and she will come up and has an area of swelling an intracerebral below her eyelids as well as around the eye. She has mild tenderness around the swelling itself. There's been no change in her vision. She's not had any drainage from the eye. She actually had periorbital cellulitis a few months ago after her dog scratched her in the face.   She's had a couple brief episodes where she got up and felt a little dizzy and only lasted for a couple of seconds. She's not had any nausea or headache associated. She has changed her diet somewhat and is working out on a regular basis. She also admits that she  drinkscoffee throughout the morning and does not drink any water    Review Of Systems:  GEN- denies fatigue, fever, weight loss,weakness, recent illness HEENT- denies eye drainage, change in vision, nasal discharge, CVS- denies chest pain, palpitations RESP- denies SOB, cough, wheeze ABD- denies N/V, change in stools, abd pain GU- denies dysuria, hematuria, dribbling, incontinence MSK- denies joint pain, muscle aches, injury Neuro- denies headache, dizziness, syncope, seizure activity       Objective:    BP 132/62 mmHg  Pulse 80  Temp(Src) 98.1 F (36.7 C) (Oral)  Resp 14  Ht 5\' 7"  (1.702 m)  Wt 200 lb 8 oz (90.946 kg)  BMI 31.40 kg/m2 GEN- NAD, alert and oriented x3 HEENT- PERRL, EOMI, non injected sclera, pink conjunctiva, erythema beneath left lid and 1inch below upper cheek, TTP +swelling, vision in tact  MMM, oropharynx clear Neck-  Supple, no LAD CVS- RRR, no murmur RESP-CTAB Neuro-CNII-XII intact, no deficits  EXT- No edema         Assessment & Plan:      Problem List Items Addressed This Visit    None    Visit Diagnoses    Periorbital cellulitis of left eye    -  Primary    Bactrim, warm compresses, no eye involvement    Dizziness        unclear cause, very short lived, advised increasing fluids with her change in workouts, protein through the day        Note: This dictation was prepared with Dragon dictation along with smaller phrase technology. Any transcriptional errors that result from this process are unintentional.

## 2015-03-26 NOTE — Progress Notes (Signed)
Patient ID: Lori Mullins, female   DOB: [birth year], 50 y.o.   MRN: [id number]  Orthostatic Blood Pressure:  Lying: 134/64   Sitting: 130/72   Standing: 132/68

## 2015-03-26 NOTE — Patient Instructions (Signed)
Take antibiotics  Continue compresses F/U as previous

## 2015-03-27 ENCOUNTER — Encounter: Payer: Self-pay | Admitting: Family Medicine

## 2015-04-16 ENCOUNTER — Other Ambulatory Visit: Payer: Self-pay | Admitting: Family Medicine

## 2015-04-17 NOTE — Telephone Encounter (Signed)
Prescription sent to pharmacy.

## 2015-04-17 NOTE — Telephone Encounter (Signed)
Okay to refill give 2 

## 2015-04-17 NOTE — Telephone Encounter (Signed)
Ok to refill 

## 2015-05-08 ENCOUNTER — Encounter: Payer: Self-pay | Admitting: Family Medicine

## 2015-05-17 ENCOUNTER — Other Ambulatory Visit: Payer: Self-pay | Admitting: Family Medicine

## 2015-05-17 NOTE — Telephone Encounter (Signed)
LRF 03/19/15 #60 + 2   LOV 03/26/15  OK refill?

## 2015-05-17 NOTE — Telephone Encounter (Signed)
Okay to refill? 

## 2015-05-17 NOTE — Telephone Encounter (Signed)
rx called in

## 2015-05-28 ENCOUNTER — Other Ambulatory Visit: Payer: Self-pay | Admitting: Family Medicine

## 2015-05-29 NOTE — Telephone Encounter (Signed)
Refill appropriate and filled per protocol. 

## 2015-06-14 ENCOUNTER — Other Ambulatory Visit: Payer: Self-pay | Admitting: Family Medicine

## 2015-06-14 MED ORDER — CYCLOBENZAPRINE HCL 10 MG PO TABS: ORAL_TABLET | ORAL | Status: DC

## 2015-06-14 NOTE — Telephone Encounter (Signed)
Ok to refill??  Last office visit 03/26/2015.  Last refill 03/19/2015, #2 refill.

## 2015-06-14 NOTE — Telephone Encounter (Signed)
Ok to refill 

## 2015-06-14 NOTE — Telephone Encounter (Signed)
Prescription sent to pharmacy.

## 2015-06-14 NOTE — Telephone Encounter (Signed)
Okay , give 2  

## 2015-06-14 NOTE — Telephone Encounter (Signed)
Medication called to pharmacy. 

## 2015-06-14 NOTE — Telephone Encounter (Signed)
Okay to refill? 

## 2015-06-21 ENCOUNTER — Encounter: Payer: Self-pay | Admitting: Family Medicine

## 2015-06-21 ENCOUNTER — Ambulatory Visit (INDEPENDENT_AMBULATORY_CARE_PROVIDER_SITE_OTHER): Payer: BLUE CROSS/BLUE SHIELD | Admitting: Family Medicine

## 2015-06-21 VITALS — BP 132/68 | HR 62 | Temp 98.6°F | Resp 12 | Ht 67.0 in | Wt 199.0 lb

## 2015-06-21 DIAGNOSIS — E669 Obesity, unspecified: Secondary | ICD-10-CM

## 2015-06-21 DIAGNOSIS — F419 Anxiety disorder, unspecified: Secondary | ICD-10-CM

## 2015-06-21 DIAGNOSIS — I10 Essential (primary) hypertension: Secondary | ICD-10-CM | POA: Diagnosis not present

## 2015-06-21 DIAGNOSIS — M150 Primary generalized (osteo)arthritis: Secondary | ICD-10-CM | POA: Diagnosis not present

## 2015-06-21 DIAGNOSIS — M8949 Other hypertrophic osteoarthropathy, multiple sites: Secondary | ICD-10-CM

## 2015-06-21 DIAGNOSIS — M159 Polyosteoarthritis, unspecified: Secondary | ICD-10-CM

## 2015-06-21 MED ORDER — ZOSTER VACCINE LIVE 19400 UNT/0.65ML ~~LOC~~ SOLR
0.6500 mL | Freq: Once | SUBCUTANEOUS | Status: DC
Start: 2015-06-21 — End: 2015-06-25

## 2015-06-21 MED ORDER — HYDROCODONE-ACETAMINOPHEN 5-325 MG PO TABS: 1.0000 | ORAL_TABLET | ORAL | Status: DC | PRN

## 2015-06-21 NOTE — Assessment & Plan Note (Signed)
Blood pressure well controlled she continues to work on her diet and her exercise in order to lose weight. She is now below 200 pounds which is a very good mile mark for her.

## 2015-06-21 NOTE — Assessment & Plan Note (Signed)
Routine Xanax

## 2015-06-21 NOTE — Progress Notes (Signed)
Patient ID: Lori Mullins, female   DOB: Jul 24, 1965, 50 y.o.   MRN: [id number]    Subjective:    Patient ID: Lori Mullins, female    DOB: Nov 03, 1965, 50 y.o.   MRN: [id number]  Patient presents for F/U and Injection  Patient here to follow-up. She is interested in getting the shingles vaccine. She is not sure if her insurance will cover at this early. Typical recommendation is that patients be 76 or older. She is doing well with her blood pressure medication she is staying active she still weight training. She is now under 200 pounds. She does request a refill on her pain medication which she uses sparingly for her arthritis. Her last labs were reviewed at the bedside.   Review Of Systems:  GEN- denies fatigue, fever, weight loss,weakness, recent illness HEENT- denies eye drainage, change in vision, nasal discharge, CVS- denies chest pain, palpitations RESP- denies SOB, cough, wheeze ABD- denies N/V, change in stools, abd pain GU- denies dysuria, hematuria, dribbling, incontinence MSK-+ joint pain, muscle aches, injury Neuro- denies headache, dizziness, syncope, seizure activity       Objective:    BP 132/68 mmHg  Pulse 62  Temp(Src) 98.6 F (37 C) (Oral)  Resp 12  Ht 5\' 7"  (1.702 m)  Wt 199 lb (90.266 kg)  BMI 31.16 kg/m2 GEN- NAD, alert and oriented x3 HEENT- PERRL, EOMI, non injected sclera, pink conjunctiva, MMM, oropharynx clear CVS- RRR, no murmur RESP-CTAB Psych- normal affect and mood  EXT- No edema Pulses- Radial, DP- 2+        Assessment & Plan:      Problem List Items Addressed This Visit    Obesity   Essential hypertension, benign - Primary   Anxiety      Note: This dictation was prepared with Dragon dictation along with smaller phrase technology. Any transcriptional errors that result from this process are unintentional.

## 2015-06-21 NOTE — Assessment & Plan Note (Addendum)
Continue topical Voltaren she also intermittently uses tramadol and hydrocodone for severe pain.

## 2015-06-21 NOTE — Patient Instructions (Signed)
F/U Oct for Physical Continue current meds

## 2015-06-21 NOTE — Addendum Note (Signed)
Addended by: Sheral Flow on: 06/21/2015 04:42 PM   Modules accepted: Orders

## 2015-06-24 ENCOUNTER — Encounter: Payer: Self-pay | Admitting: Family Medicine

## 2015-06-25 MED ORDER — ZOSTER VACCINE LIVE 19400 UNT/0.65ML ~~LOC~~ SOLR: 0.6500 mL | Freq: Once | SUBCUTANEOUS | Status: DC

## 2015-07-01 ENCOUNTER — Encounter: Payer: Self-pay | Admitting: Family Medicine

## 2015-07-15 ENCOUNTER — Encounter: Payer: Self-pay | Admitting: Family Medicine

## 2015-08-15 ENCOUNTER — Other Ambulatory Visit: Payer: Self-pay | Admitting: Family Medicine

## 2015-08-18 ENCOUNTER — Other Ambulatory Visit: Payer: Self-pay | Admitting: Family Medicine

## 2015-08-20 NOTE — Telephone Encounter (Signed)
okay

## 2015-08-20 NOTE — Telephone Encounter (Signed)
rx's called in . 

## 2015-08-20 NOTE — Telephone Encounter (Signed)
Refill appropriate and filled per protocol. 

## 2015-08-20 NOTE — Telephone Encounter (Signed)
Alprazolam LRF 05/17/15 #60 + 2  Tramadol LRF 06/14/15 #60 + 2  LOV 06/21/15  OK refill?

## 2015-09-02 ENCOUNTER — Encounter: Payer: Self-pay | Admitting: Family Medicine

## 2015-09-02 MED ORDER — HYDROCODONE-ACETAMINOPHEN 5-325 MG PO TABS: 1.0000 | ORAL_TABLET | ORAL | Status: DC | PRN

## 2015-09-16 ENCOUNTER — Other Ambulatory Visit: Payer: Self-pay | Admitting: Family Medicine

## 2015-10-14 ENCOUNTER — Other Ambulatory Visit: Payer: Self-pay | Admitting: Family Medicine

## 2015-10-14 ENCOUNTER — Encounter: Payer: Self-pay | Admitting: Family Medicine

## 2015-10-14 MED ORDER — HYDROCODONE-ACETAMINOPHEN 5-325 MG PO TABS: 1.0000 | ORAL_TABLET | ORAL | 0 refills | Status: DC | PRN

## 2015-10-14 NOTE — Telephone Encounter (Signed)
LRF 09/02/15 #30   LOV 06/21/15  OK refill?

## 2015-10-15 ENCOUNTER — Other Ambulatory Visit: Payer: Self-pay | Admitting: Family Medicine

## 2015-10-15 NOTE — Telephone Encounter (Signed)
Ok to refill??  Last office visit 06/21/2015.  Last refill on both 08/20/2015.

## 2015-10-15 NOTE — Telephone Encounter (Signed)
Ok to refill 

## 2015-10-15 NOTE — Telephone Encounter (Signed)
okay

## 2015-10-15 NOTE — Telephone Encounter (Signed)
Okay to refill give 3 

## 2015-10-16 NOTE — Telephone Encounter (Signed)
Rx's called in . 

## 2015-11-12 ENCOUNTER — Other Ambulatory Visit: Payer: Self-pay | Admitting: Family Medicine

## 2015-11-12 NOTE — Telephone Encounter (Signed)
okay

## 2015-11-12 NOTE — Telephone Encounter (Signed)
LRF 10/16/15 #45   LOV 06/21/15  OK refill?

## 2015-11-12 NOTE — Telephone Encounter (Signed)
Rx called in 

## 2015-12-02 ENCOUNTER — Encounter: Payer: Self-pay | Admitting: Family Medicine

## 2015-12-02 ENCOUNTER — Ambulatory Visit (INDEPENDENT_AMBULATORY_CARE_PROVIDER_SITE_OTHER): Payer: BLUE CROSS/BLUE SHIELD | Admitting: Family Medicine

## 2015-12-02 ENCOUNTER — Ambulatory Visit (HOSPITAL_COMMUNITY)
Admission: RE | Admit: 2015-12-02 | Discharge: 2015-12-02 | Disposition: A | Discharge status: Home / Self Care | Payer: BLUE CROSS/BLUE SHIELD | Source: Ambulatory Visit | Attending: Family Medicine | Admitting: Family Medicine

## 2015-12-02 VITALS — BP 130/78 | HR 66 | Temp 98.9°F | Resp 14 | Ht 67.0 in | Wt 196.0 lb

## 2015-12-02 DIAGNOSIS — Z23 Encounter for immunization: Secondary | ICD-10-CM | POA: Diagnosis not present

## 2015-12-02 DIAGNOSIS — M25512 Pain in left shoulder: Secondary | ICD-10-CM

## 2015-12-02 DIAGNOSIS — M19042 Primary osteoarthritis, left hand: Secondary | ICD-10-CM

## 2015-12-02 DIAGNOSIS — W19XXXA Unspecified fall, initial encounter: Secondary | ICD-10-CM | POA: Diagnosis not present

## 2015-12-02 MED ORDER — HYDROCODONE-ACETAMINOPHEN 5-325 MG PO TABS: 1.0000 | ORAL_TABLET | ORAL | 0 refills | Status: DC | PRN

## 2015-12-02 MED ORDER — TRAMADOL HCL 50 MG PO TABS: 50.0000 mg | ORAL_TABLET | Freq: Two times a day (BID) | ORAL | 2 refills | Status: DC | PRN

## 2015-12-02 MED ORDER — DICLOFENAC SODIUM 1 % TD GEL: 2.0000 g | Freq: Four times a day (QID) | TRANSDERMAL | 6 refills | Status: DC

## 2015-12-02 NOTE — Patient Instructions (Signed)
Get xray at [facility] Take pain medications F/U as previous

## 2015-12-02 NOTE — Progress Notes (Signed)
Subjective:    Patient ID: Lori Mullins, female    DOB: 1965/10/27, 50 y.o.   MRN: [id number]  Patient presents for L Shoulder Injury (x 2 days- fell on tile floor on Saturday- landed on shoulder- ROM decreased); Chiggar Bites (states that she has bites to upper thighs ); and L Index Finger Pain (joints swollen and painful) Patient here with injury to her left shoulder. 2 days ago she was actually in her home she had been drinking she bent over to pick up something and lost her balance and fell and hit her left posterior shoulder on her tile floor. She has significant pain at the time as well as swelling and bruising. She was initially unable to elevate her arm but now that has improved some. She noticed a lot of movement of the shoulder. She's been taking tramadol which is helped minimally she is out of her hydrocodone.  She's also noticed some swelling of her left index finger at the proximal joint it occurs on and off for Voltaren gel does help.  She was walking out in the woods last week came home which is her bite she has been using alcohol to them which is helped. She has them all over her legs this is more of an FYI she did not one anything to treat them as she's had them before    Review Of Systems:  GEN- denies fatigue, fever, weight loss,weakness, recent illness HEENT- denies eye drainage, change in vision, nasal discharge, CVS- denies chest pain, palpitations RESP- denies SOB, cough, wheeze ABD- denies N/V, change in stools, abd pain GU- denies dysuria, hematuria, dribbling, incontinence MSK- + joint pain, muscle aches, injury Neuro- denies headache, dizziness, syncope, seizure activity       Objective:    BP 130/78 (BP Location: Right Arm, Patient Position: Sitting, Cuff Size: Normal)   Pulse 66   Temp 98.9 F (37.2 C) (Oral)   Resp 14   Ht 5\' 7"  (1.702 m)   Wt 196 lb (88.9 kg)   BMI 30.70 kg/m  GEN- NAD, alert and oriented x3 HEENT- PERRL, EOMI, non injected  sclera, pink conjunctiva, MMM, oropharynx clear Neck- Supple, CVS- RRR, no murmur RESP-CTAB MSK- Neck good ROM, Right shoulder normal inspection good ROM, left shoulder- swelling over ball/socket, clicking with elevation above shoulder height, TTP at [facility] joint, bruising noted, positive empty can  Left hand index finger, mild swelling at PIP, NT  EXT- No edema Pulses- Radial  2+        Assessment & Plan:      Problem List Items Addressed This Visit    None    Visit Diagnoses    Acute pain of left shoulder    -  Primary   Concern for dislocation based on mechanism or fracture, obtain xray,refilled pain meds, her ROM is significantly improved from this weekend. OA in index finger. Ortho referral pending what is seen on xray   Relevant Orders   DG Shoulder Left   Need for prophylactic vaccination and inoculation against influenza       Relevant Orders   Flu Vaccine QUAD 36+ mos PF IM (Fluarix & Fluzone Quad PF) (Completed)   OA (osteoarthritis) of finger, left       cONTINUE topical voltaren gel   Relevant Medications   HYDROcodone-acetaminophen (NORCO/VICODIN) 5-325 MG tablet   traMADol (ULTRAM) 50 MG tablet      Note: This dictation was prepared with Dragon dictation along with smaller phrase technology.  Any transcriptional errors that result from this process are unintentional.

## 2015-12-16 ENCOUNTER — Encounter: Payer: Self-pay | Admitting: Family Medicine

## 2015-12-16 ENCOUNTER — Other Ambulatory Visit: Payer: Self-pay | Admitting: Family Medicine

## 2015-12-16 ENCOUNTER — Encounter (HOSPITAL_COMMUNITY): Payer: Self-pay | Admitting: Obstetrics & Gynecology

## 2015-12-16 NOTE — Progress Notes (Signed)
Not my pt

## 2015-12-16 NOTE — Telephone Encounter (Signed)
Okay give 2 refills

## 2015-12-16 NOTE — Telephone Encounter (Signed)
Ok to refill 

## 2015-12-17 NOTE — Telephone Encounter (Signed)
Prescription sent to pharmacy.

## 2015-12-18 ENCOUNTER — Other Ambulatory Visit: Payer: BLUE CROSS/BLUE SHIELD

## 2015-12-18 DIAGNOSIS — I10 Essential (primary) hypertension: Secondary | ICD-10-CM

## 2015-12-18 DIAGNOSIS — Z79899 Other long term (current) drug therapy: Secondary | ICD-10-CM

## 2015-12-18 DIAGNOSIS — Z0000 Encounter for general adult medical examination without abnormal findings: Secondary | ICD-10-CM

## 2015-12-18 DIAGNOSIS — F419 Anxiety disorder, unspecified: Secondary | ICD-10-CM

## 2015-12-18 LAB — COMPLETE METABOLIC PANEL WITH GFR
ALT: 18 U/L (ref 6–29)
AST: 20 U/L (ref 10–35)
Albumin: 4.5 g/dL (ref 3.6–5.1)
Alkaline Phosphatase: 84 U/L (ref 33–130)
BUN: 10 mg/dL (ref 7–25)
CALCIUM: 9.8 mg/dL (ref 8.6–10.4)
CHLORIDE: 103 mmol/L (ref 98–110)
CO2: 28 mmol/L (ref 20–31)
Creat: 0.75 mg/dL (ref 0.50–1.05)
GFR, Est Non African American: >89 mL/min (ref >=60)
Glucose, Bld: 88 mg/dL (ref 70–99)
POTASSIUM: 4.8 mmol/L (ref 3.5–5.3)
Sodium: 141 mmol/L (ref 135–146)
Total Bilirubin: 0.3 mg/dL (ref 0.2–1.2)
Total Protein: 6.9 g/dL (ref 6.1–8.1)

## 2015-12-18 LAB — CBC WITH DIFFERENTIAL/PLATELET
Basophils Absolute: 63 cells/uL (ref 0–200)
Basophils Relative: 1 %
EOS ABS: 252 {cells}/uL (ref 15–500)
Eosinophils Relative: 4 %
HEMATOCRIT: 46.1 % — AB (ref 35.0–45.0)
Hemoglobin: 15.8 g/dL — ABNORMAL HIGH (ref 12.0–15.0)
LYMPHS PCT: 27 %
Lymphs Abs: 1701 cells/uL (ref 850–3900)
MCH: 31.7 pg (ref 27.0–33.0)
MCHC: 34.3 g/dL (ref 32.0–36.0)
MCV: 92.6 fL (ref 80.0–100.0)
MONO ABS: 504 {cells}/uL (ref 200–950)
MONOS PCT: 8 %
MPV: 9.3 fL (ref 7.5–12.5)
NEUTROS PCT: 60 %
Neutro Abs: 3780 cells/uL (ref 1500–7800)
Platelets: 260 10*3/uL (ref 140–400)
RBC: 4.98 MIL/uL (ref 3.80–5.10)
RDW: 12.5 % (ref 11.0–15.0)
WBC: 6.3 10*3/uL (ref 3.8–10.8)

## 2015-12-18 LAB — LIPID PANEL
CHOL/HDL RATIO: 1.9 ratio (ref <=5.0)
CHOLESTEROL: 157 mg/dL (ref 125–200)
HDL: 83 mg/dL (ref >=46)
LDL Cholesterol: 56 mg/dL (ref <130)
Triglycerides: 88 mg/dL (ref <150)
VLDL: 18 mg/dL (ref <30)

## 2015-12-18 LAB — TSH: TSH: 1.13 m[IU]/L

## 2015-12-23 ENCOUNTER — Encounter: Payer: Self-pay | Admitting: Family Medicine

## 2015-12-23 ENCOUNTER — Ambulatory Visit (INDEPENDENT_AMBULATORY_CARE_PROVIDER_SITE_OTHER): Payer: BLUE CROSS/BLUE SHIELD | Admitting: Family Medicine

## 2015-12-23 VITALS — BP 142/88 | HR 80 | Temp 98.2°F | Resp 14 | Ht 67.0 in | Wt 194.0 lb

## 2015-12-23 DIAGNOSIS — Z0000 Encounter for general adult medical examination without abnormal findings: Secondary | ICD-10-CM

## 2015-12-23 DIAGNOSIS — M159 Polyosteoarthritis, unspecified: Secondary | ICD-10-CM

## 2015-12-23 DIAGNOSIS — M8949 Other hypertrophic osteoarthropathy, multiple sites: Secondary | ICD-10-CM

## 2015-12-23 DIAGNOSIS — Z6830 Body mass index (BMI) 30.0-30.9, adult: Secondary | ICD-10-CM | POA: Diagnosis not present

## 2015-12-23 DIAGNOSIS — M150 Primary generalized (osteo)arthritis: Secondary | ICD-10-CM

## 2015-12-23 DIAGNOSIS — I10 Essential (primary) hypertension: Secondary | ICD-10-CM | POA: Diagnosis not present

## 2015-12-23 DIAGNOSIS — E6609 Other obesity due to excess calories: Secondary | ICD-10-CM | POA: Diagnosis not present

## 2015-12-23 MED ORDER — HYDROCODONE-ACETAMINOPHEN 5-325 MG PO TABS: 1.0000 | ORAL_TABLET | ORAL | 0 refills | Status: DC | PRN

## 2015-12-23 NOTE — Assessment & Plan Note (Signed)
BMI much improved continues to workout and watch her diet

## 2015-12-23 NOTE — Progress Notes (Signed)
   Subjective:    Patient ID: Lori Mullins, female    DOB: 19-Oct-1965, 50 y.o.   MRN: [id number]  Patient presents for CPE (has had labs)  Pt Here for complete physical exam  Colonoscopy- overdue , she agrees to stool cards PAP Smear UTD Hep C UTD  Immunizations UTD   Reviewed fasting labs   Mammogram- scheduled for next month  No new concerns,shoulder much improved, she has been doing exercises, her ROM has improved, still gets some popping, now using Voltaren gel     Review Of Systems:  GEN- denies fatigue, fever, weight loss,weakness, recent illness HEENT- denies eye drainage, change in vision, nasal discharge, CVS- denies chest pain, palpitations RESP- denies SOB, cough, wheeze ABD- denies N/V, change in stools, abd pain GU- denies dysuria, hematuria, dribbling, incontinence MSK- + joint pain, muscle aches, injury Neuro- denies headache, dizziness, syncope, seizure activity       Objective:    BP (!) 142/88 (BP Location: Left Arm, Patient Position: Sitting, Cuff Size: Large)   Pulse 80   Temp 98.2 F (36.8 C) (Oral)   Resp 14   Ht 5\' 7"  (1.702 m)   Wt 194 lb (88 kg)   SpO2 99% Comment: RA  BMI 30.38 kg/m  GEN- NAD, alert and oriented x3 HEENT- PERRL, EOMI, non injected sclera, pink conjunctiva, MMM, oropharynx clear Neck- Supple, no thyromegaly CVS- RRR, no murmur RESP-CTAB ABD-NABS,soft,NT,ND MSK-, left shoulder- rotator cuff in tact  clicking with elevation above shoulder height, NT, no swelling  EXT- No edema Pulses- Radial, DP- 2+        Assessment & Plan:      Problem List Items Addressed This Visit    Obesity    BMI much improved continues to workout and watch her diet       OA (osteoarthritis)    Continue voltaren, uses Norco as needed for severe pain,  She did require for her shoulder injury, therefore I refilled again today       Relevant Medications   HYDROcodone-acetaminophen (NORCO/VICODIN) 5-325 MG tablet   Essential  hypertension, benign    Well controlled, no changes        Other Visit Diagnoses    Routine general medical examination at a health care facility    -  Primary   CPE done, immunizations UTD, to schedule Mammogram, return stool cards      Note: This dictation was prepared with Dragon dictation along with smaller phrase technology. Any transcriptional errors that result from this process are unintentional.

## 2015-12-23 NOTE — Assessment & Plan Note (Signed)
Well controlled, no changes 

## 2015-12-23 NOTE — Assessment & Plan Note (Signed)
Continue voltaren, uses Norco as needed for severe pain,  She did require for her shoulder injury, therefore I refilled again today

## 2015-12-23 NOTE — Patient Instructions (Addendum)
Mammogram Return the stool cards F/U 6 months

## 2015-12-25 ENCOUNTER — Other Ambulatory Visit: Payer: Self-pay | Admitting: Family Medicine

## 2015-12-25 DIAGNOSIS — Z1231 Encounter for screening mammogram for malignant neoplasm of breast: Secondary | ICD-10-CM

## 2016-01-14 ENCOUNTER — Other Ambulatory Visit: Payer: Self-pay | Admitting: Family Medicine

## 2016-01-15 NOTE — Telephone Encounter (Signed)
Ok to refill??  Last office visit 12/23/2015.  Last refill 10/16/2015, #2 refills.

## 2016-01-15 NOTE — Telephone Encounter (Signed)
Okay to refill? 

## 2016-01-16 NOTE — Telephone Encounter (Signed)
Medication called to pharmacy. 

## 2016-01-22 ENCOUNTER — Ambulatory Visit (HOSPITAL_COMMUNITY)
Admission: RE | Admit: 2016-01-22 | Discharge: 2016-01-22 | Disposition: A | Discharge status: Home / Self Care | Payer: BLUE CROSS/BLUE SHIELD | Source: Ambulatory Visit | Attending: Family Medicine | Admitting: Family Medicine

## 2016-01-22 DIAGNOSIS — Z1231 Encounter for screening mammogram for malignant neoplasm of breast: Secondary | ICD-10-CM | POA: Insufficient documentation

## 2016-02-03 ENCOUNTER — Other Ambulatory Visit: Payer: Self-pay | Admitting: Family Medicine

## 2016-02-03 MED ORDER — HYDROCODONE-ACETAMINOPHEN 5-325 MG PO TABS: 1.0000 | ORAL_TABLET | ORAL | 0 refills | Status: DC | PRN

## 2016-02-04 MED ORDER — HYDROCODONE-ACETAMINOPHEN 5-325 MG PO TABS: 1.0000 | ORAL_TABLET | Freq: Four times a day (QID) | ORAL | 0 refills | Status: DC | PRN

## 2016-02-04 NOTE — Addendum Note (Signed)
Addended by: Sheral Flow on: 02/04/2016 11:57 AM   Modules accepted: Orders

## 2016-02-06 ENCOUNTER — Encounter: Payer: Self-pay | Admitting: Family Medicine

## 2016-02-06 MED ORDER — LOSARTAN POTASSIUM 25 MG PO TABS: 25.0000 mg | ORAL_TABLET | Freq: Every day | ORAL | 4 refills | Status: DC

## 2016-03-16 ENCOUNTER — Other Ambulatory Visit: Payer: Self-pay | Admitting: Family Medicine

## 2016-03-16 NOTE — Telephone Encounter (Signed)
Ok to refill??  Last office visit 12/23/2015.  Last refill on Flexeril 12/17/2015, #2 refills.   Last refill on Tramadol 12/02/2015, #2 refills.   Last refill on Xanax 01/16/2016, #2 refills.

## 2016-03-16 NOTE — Telephone Encounter (Signed)
Okay to refill? 

## 2016-03-17 NOTE — Telephone Encounter (Signed)
Rx called in to pharmacy. 

## 2016-03-26 ENCOUNTER — Encounter: Payer: Self-pay | Admitting: Family Medicine

## 2016-03-26 ENCOUNTER — Other Ambulatory Visit: Payer: Self-pay | Admitting: Family Medicine

## 2016-03-27 MED ORDER — HYDROCODONE-ACETAMINOPHEN 5-325 MG PO TABS: 1.0000 | ORAL_TABLET | Freq: Four times a day (QID) | ORAL | 0 refills | Status: DC | PRN

## 2016-04-22 ENCOUNTER — Other Ambulatory Visit: Payer: Self-pay | Admitting: Family Medicine

## 2016-04-22 ENCOUNTER — Encounter: Payer: Self-pay | Admitting: Family Medicine

## 2016-04-22 ENCOUNTER — Telehealth: Payer: Self-pay | Admitting: *Deleted

## 2016-04-22 NOTE — Telephone Encounter (Signed)
Received fax requesting refill on Tramadol and Flexeril.   Ok to refill??  Last office visit 12/26/2015.  Last refill on both 03/17/2016.

## 2016-04-22 NOTE — Telephone Encounter (Signed)
Okay to refill give 2 

## 2016-04-23 MED ORDER — TRAMADOL HCL 50 MG PO TABS
50.0000 mg | ORAL_TABLET | Freq: Two times a day (BID) | ORAL | 2 refills | Status: DC | PRN
Start: 2016-04-23 — End: 2016-07-20

## 2016-04-23 MED ORDER — CYCLOBENZAPRINE HCL 10 MG PO TABS: 10.0000 mg | ORAL_TABLET | Freq: Three times a day (TID) | ORAL | 2 refills | Status: DC | PRN

## 2016-04-23 NOTE — Telephone Encounter (Signed)
Medication called to pharmacy. 

## 2016-05-19 ENCOUNTER — Other Ambulatory Visit: Payer: Self-pay | Admitting: Family Medicine

## 2016-05-20 NOTE — Telephone Encounter (Signed)
Okay give 3  

## 2016-05-20 NOTE — Telephone Encounter (Signed)
Ok to refill??  Last office visit 12/23/2015.  Last refill 03/17/2016.

## 2016-05-20 NOTE — Telephone Encounter (Signed)
Medication called to pharmacy. 

## 2016-05-26 ENCOUNTER — Other Ambulatory Visit: Payer: Self-pay | Admitting: Family Medicine

## 2016-05-26 ENCOUNTER — Encounter: Payer: Self-pay | Admitting: Family Medicine

## 2016-05-27 MED ORDER — HYDROCODONE-ACETAMINOPHEN 5-325 MG PO TABS: 1.0000 | ORAL_TABLET | Freq: Four times a day (QID) | ORAL | 0 refills | Status: DC | PRN

## 2016-06-06 ENCOUNTER — Other Ambulatory Visit: Payer: Self-pay | Admitting: Family Medicine

## 2016-06-08 NOTE — Telephone Encounter (Signed)
Ok to refill 

## 2016-06-08 NOTE — Telephone Encounter (Signed)
Okay to refill? 

## 2016-06-23 ENCOUNTER — Encounter: Payer: Self-pay | Admitting: Family Medicine

## 2016-06-26 ENCOUNTER — Ambulatory Visit: Payer: BLUE CROSS/BLUE SHIELD | Admitting: Family Medicine

## 2016-07-10 ENCOUNTER — Other Ambulatory Visit: Payer: Self-pay | Admitting: Family Medicine

## 2016-07-10 MED ORDER — HYDROCODONE-ACETAMINOPHEN 5-325 MG PO TABS: 1.0000 | ORAL_TABLET | Freq: Four times a day (QID) | ORAL | 0 refills | Status: DC | PRN

## 2016-07-20 ENCOUNTER — Other Ambulatory Visit: Payer: Self-pay | Admitting: Family Medicine

## 2016-07-20 NOTE — Telephone Encounter (Signed)
okay

## 2016-07-20 NOTE — Telephone Encounter (Signed)
Medication called to pharmacy. 

## 2016-07-20 NOTE — Telephone Encounter (Signed)
Ok to refill??  Last office visit 12/22/2016.  Last refill 04/23/2016, #2 refills.

## 2016-07-21 ENCOUNTER — Other Ambulatory Visit: Payer: Self-pay | Admitting: Family Medicine

## 2016-07-21 NOTE — Telephone Encounter (Signed)
okay

## 2016-07-21 NOTE — Telephone Encounter (Signed)
Ok to refill 

## 2016-08-03 ENCOUNTER — Ambulatory Visit (INDEPENDENT_AMBULATORY_CARE_PROVIDER_SITE_OTHER): Payer: PRIVATE HEALTH INSURANCE | Admitting: Orthopedic Surgery

## 2016-08-03 ENCOUNTER — Ambulatory Visit (INDEPENDENT_AMBULATORY_CARE_PROVIDER_SITE_OTHER): Payer: PRIVATE HEALTH INSURANCE

## 2016-08-03 ENCOUNTER — Encounter: Payer: Self-pay | Admitting: Orthopedic Surgery

## 2016-08-03 VITALS — BP 146/90 | HR 82 | Ht 67.0 in | Wt 191.0 lb

## 2016-08-03 DIAGNOSIS — M76822 Posterior tibial tendinitis, left leg: Secondary | ICD-10-CM

## 2016-08-03 DIAGNOSIS — M25572 Pain in left ankle and joints of left foot: Secondary | ICD-10-CM

## 2016-08-03 NOTE — Progress Notes (Signed)
  NEW PATIENT OFFICE VISIT last seen 07/25/2013   Chief Complaint  Patient presents with  . Ankle Pain    Left ankle pain, no injury.    51 year old female presents with long-standing history of bilateral pes planus. On the left side since October after a long period of walking she noticed increasing pain swelling on the medial side of her ankle which did not respond to wear, brace wear she did get some relief from compression stocking. She also uses Voltaren cream  She's having progressive flatfoot    Review of Systems  Respiratory: Negative for shortness of breath.   Cardiovascular: Negative for chest pain.  Skin: Negative.  Negative for itching and rash.  Neurological: Negative for tingling.     Past Medical History:  Diagnosis Date  . Anxiety   . Arthritis    Hip/Hands  . Campylobacter intestinal infection 1996   Became systemic  . Hypertension   . OCD (obsessive compulsive disorder) 2007  . Plantar fasciitis    seen by orthoStevie Kern    Past Surgical History:  Procedure Laterality Date  . CERVICAL DISCECTOMY    . RETINAL DETACHMENT SURGERY  2005    Family History  Problem Relation Age of Onset  . Hypertension Father    Social History  Substance Use Topics  . Smoking status: Former Research scientist (life sciences)  . Smokeless tobacco: Never Used  . Alcohol use 0.0 oz/week     Comment: occasionally    BP (!) 146/90   Pulse 82   Ht 5\' 7"  (1.702 m)   Wt 191 lb (86.6 kg)   BMI 29.91 kg/m   Physical Exam  Constitutional: She is oriented to person, place, and time. She appears well-developed and well-nourished.  Neurological: She is alert and oriented to person, place, and time.  Psychiatric: She has a normal mood and affect.  Vitals reviewed.   Ortho Exam  Gait normal Looking at both feet together in the standing position she has bilateral pes planus she has weak heel rise on the left with too many toes sign bilaterally. She could do 3 heel raises and she had manual muscle  testing weakness of the posterior tibial tendon on the left normal on the right. She has bilateral callus formation at the talus bilaterally she is tender with swelling in the posterior tibial tendon on the left neurovascular exam is normal in both and skin shows no other lesions No orders of the defined types were placed in this encounter.   Encounter Diagnoses  Name Primary?  . Pain of joint of left ankle and foot Yes  . Posterior tibial tendon dysfunction (PTTD) of left lower extremity    X-rays show no ankle arthritis but abnormal tarsometatarsal angle  PLAN:   I'm recommending that she see Dr. Sharol Given, she will see him in August. He will evaluate her left posterior tibial tendon for further treatment  Addendum the subtalar joint seemed to be mobile and she did have some Achilles tendon tightness with knee extension

## 2016-08-28 ENCOUNTER — Other Ambulatory Visit: Payer: Self-pay | Admitting: Family Medicine

## 2016-08-28 NOTE — Telephone Encounter (Signed)
Ok to refill 

## 2016-08-28 NOTE — Telephone Encounter (Signed)
okay

## 2016-08-31 ENCOUNTER — Other Ambulatory Visit: Payer: Self-pay | Admitting: Family Medicine

## 2016-09-01 MED ORDER — HYDROCODONE-ACETAMINOPHEN 5-325 MG PO TABS: 1.0000 | ORAL_TABLET | Freq: Four times a day (QID) | ORAL | 0 refills | Status: DC | PRN

## 2016-09-20 ENCOUNTER — Other Ambulatory Visit: Payer: Self-pay | Admitting: Family Medicine

## 2016-09-21 NOTE — Telephone Encounter (Signed)
Okay to refill? 

## 2016-09-21 NOTE — Telephone Encounter (Signed)
Medication called to pharmacy. 

## 2016-09-21 NOTE — Telephone Encounter (Signed)
Ok to refill??  Last office visit 12/22/2016.  Last refill 05/20/2016, #3 refills.

## 2016-09-22 ENCOUNTER — Ambulatory Visit: Payer: Self-pay

## 2016-09-22 ENCOUNTER — Other Ambulatory Visit: Payer: Self-pay | Admitting: Occupational Medicine

## 2016-09-22 ENCOUNTER — Encounter: Payer: Self-pay | Admitting: Family Medicine

## 2016-09-22 DIAGNOSIS — Z0000 Encounter for general adult medical examination without abnormal findings: Secondary | ICD-10-CM

## 2016-10-10 ENCOUNTER — Other Ambulatory Visit: Payer: Self-pay | Admitting: Family Medicine

## 2016-10-12 NOTE — Telephone Encounter (Signed)
okay

## 2016-10-12 NOTE — Telephone Encounter (Signed)
Prescription sent to pharmacy.

## 2016-10-12 NOTE — Telephone Encounter (Signed)
Ok to refill 

## 2016-10-19 ENCOUNTER — Ambulatory Visit (INDEPENDENT_AMBULATORY_CARE_PROVIDER_SITE_OTHER): Payer: BLUE CROSS/BLUE SHIELD | Admitting: Orthopedic Surgery

## 2016-10-21 ENCOUNTER — Other Ambulatory Visit: Payer: Self-pay | Admitting: Family Medicine

## 2016-10-21 NOTE — Telephone Encounter (Signed)
Give 1 refill, needs OV

## 2016-10-21 NOTE — Telephone Encounter (Signed)
Ok to refill??  Last office visit 12/22/2016.  Last refill 07/20/2016, #2 refills.

## 2016-10-22 NOTE — Telephone Encounter (Signed)
Medication called to pharmacy. 

## 2016-11-27 ENCOUNTER — Other Ambulatory Visit: Payer: Self-pay | Admitting: Family Medicine

## 2016-11-27 NOTE — Telephone Encounter (Signed)
LRF 10/12/16 # 45 + 2RF   LOV 01/23/16  OK refill?

## 2016-11-27 NOTE — Telephone Encounter (Signed)
Give 1 refill, she has appt next week, needs to keep, seems she is using more muscle relaxer

## 2016-12-09 NOTE — Telephone Encounter (Signed)
Prescription sent to pharmacy.

## 2016-12-11 ENCOUNTER — Ambulatory Visit: Payer: BLUE CROSS/BLUE SHIELD | Admitting: Family Medicine

## 2016-12-16 ENCOUNTER — Other Ambulatory Visit: Payer: Self-pay | Admitting: Family Medicine

## 2016-12-16 ENCOUNTER — Encounter: Payer: Self-pay | Admitting: Family Medicine

## 2016-12-16 DIAGNOSIS — Z1231 Encounter for screening mammogram for malignant neoplasm of breast: Secondary | ICD-10-CM

## 2016-12-18 ENCOUNTER — Ambulatory Visit (INDEPENDENT_AMBULATORY_CARE_PROVIDER_SITE_OTHER): Payer: Managed Care, Other (non HMO) | Admitting: Family Medicine

## 2016-12-18 ENCOUNTER — Encounter: Payer: Self-pay | Admitting: Family Medicine

## 2016-12-18 VITALS — BP 172/98 | HR 88 | Temp 98.4°F | Resp 18 | Wt 203.4 lb

## 2016-12-18 DIAGNOSIS — Z23 Encounter for immunization: Secondary | ICD-10-CM | POA: Diagnosis not present

## 2016-12-18 DIAGNOSIS — I10 Essential (primary) hypertension: Secondary | ICD-10-CM

## 2016-12-18 DIAGNOSIS — Z6830 Body mass index (BMI) 30.0-30.9, adult: Secondary | ICD-10-CM

## 2016-12-18 DIAGNOSIS — F411 Generalized anxiety disorder: Secondary | ICD-10-CM | POA: Diagnosis not present

## 2016-12-18 DIAGNOSIS — M150 Primary generalized (osteo)arthritis: Secondary | ICD-10-CM

## 2016-12-18 DIAGNOSIS — G5602 Carpal tunnel syndrome, left upper limb: Secondary | ICD-10-CM

## 2016-12-18 DIAGNOSIS — E6609 Other obesity due to excess calories: Secondary | ICD-10-CM | POA: Diagnosis not present

## 2016-12-18 DIAGNOSIS — Z79899 Other long term (current) drug therapy: Secondary | ICD-10-CM | POA: Diagnosis not present

## 2016-12-18 DIAGNOSIS — E66811 Obesity, class 1: Secondary | ICD-10-CM

## 2016-12-18 DIAGNOSIS — M159 Polyosteoarthritis, unspecified: Secondary | ICD-10-CM

## 2016-12-18 DIAGNOSIS — M8949 Other hypertrophic osteoarthropathy, multiple sites: Secondary | ICD-10-CM

## 2016-12-18 LAB — CBC WITH DIFFERENTIAL/PLATELET
Basophils Absolute: 56 cells/uL (ref 0–200)
Basophils Relative: 0.6 %
EOS ABS: 93 {cells}/uL (ref 15–500)
Eosinophils Relative: 1 %
HEMATOCRIT: 47.3 % — AB (ref 35.0–45.0)
Hemoglobin: 16.3 g/dL — ABNORMAL HIGH (ref 11.7–15.5)
LYMPHS ABS: 1683 {cells}/uL (ref 850–3900)
MCH: 30.4 pg (ref 27.0–33.0)
MCHC: 34.5 g/dL (ref 32.0–36.0)
MCV: 88.2 fL (ref 80.0–100.0)
MPV: 10.5 fL (ref 7.5–12.5)
Monocytes Relative: 4.3 %
NEUTROS PCT: 76 %
Neutro Abs: 7068 cells/uL (ref 1500–7800)
Platelets: 269 10*3/uL (ref 140–400)
RBC: 5.36 10*6/uL — AB (ref 3.80–5.10)
RDW: 12.2 % (ref 11.0–15.0)
TOTAL LYMPHOCYTE: 18.1 %
WBC: 9.3 10*3/uL (ref 3.8–10.8)
WBCMIX: 400 {cells}/uL (ref 200–950)

## 2016-12-18 LAB — COMPREHENSIVE METABOLIC PANEL
AG Ratio: 1.7 (calc) (ref 1.0–2.5)
ALBUMIN MSPROF: 4.5 g/dL (ref 3.6–5.1)
ALKALINE PHOSPHATASE (APISO): 80 U/L (ref 33–130)
ALT: 16 U/L (ref 6–29)
AST: 16 U/L (ref 10–35)
BILIRUBIN TOTAL: 0.6 mg/dL (ref 0.2–1.2)
BUN: 12 mg/dL (ref 7–25)
CALCIUM: 10.1 mg/dL (ref 8.6–10.4)
CHLORIDE: 101 mmol/L (ref 98–110)
CO2: 27 mmol/L (ref 20–32)
Creat: 0.71 mg/dL (ref 0.50–1.05)
GLOBULIN: 2.7 g/dL (ref 1.9–3.7)
Glucose, Bld: 95 mg/dL (ref 65–99)
POTASSIUM: 4.2 mmol/L (ref 3.5–5.3)
Sodium: 139 mmol/L (ref 135–146)
Total Protein: 7.2 g/dL (ref 6.1–8.1)

## 2016-12-18 LAB — LIPID PANEL
CHOLESTEROL: 164 mg/dL (ref <200)
HDL: 100 mg/dL (ref >50)
LDL Cholesterol (Calc): 49 mg/dL (calc)
Non-HDL Cholesterol (Calc): 64 mg/dL (calc) (ref <130)
Total CHOL/HDL Ratio: 1.6 (calc) (ref <5.0)
Triglycerides: 71 mg/dL (ref <150)

## 2016-12-18 MED ORDER — HYDROCODONE-ACETAMINOPHEN 5-325 MG PO TABS: 1.0000 | ORAL_TABLET | Freq: Four times a day (QID) | ORAL | 0 refills | Status: DC | PRN

## 2016-12-18 MED ORDER — ALPRAZOLAM 1 MG PO TABS: ORAL_TABLET | ORAL | 1 refills | Status: DC

## 2016-12-18 NOTE — Progress Notes (Signed)
Subjective:    Patient ID: Lori Mullins, female    DOB: 21-Jul-1965, 51 y.o.   MRN: [id number]  Patient presents for 6 mth follow up (is fasting) and Flu Vaccine  Pt here to f/u chronic medical problems  GAD- states she is in jeopardy of losing her job, they are concerned about some of behaviors, she had to submit drug testing samples.  She admits to smoking marijuana  But also on Xanax as needed- which she has not shown and abberant behavior, also on Norco prn due to OA  Very stressed out today, not sure what will happen to her job next week  HTN- taking losartan 25mg  daily   Also complianed of tingling in hands, mostly left hand thumb and index finger, worse when talking on the phone, has not dropped any items  Review Of Systems:  GEN- denies fatigue, fever, weight loss,weakness, recent illness HEENT- denies eye drainage, change in vision, nasal discharge, CVS- denies chest pain, palpitations RESP- denies SOB, cough, wheeze ABD- denies N/V, change in stools, abd pain GU- denies dysuria, hematuria, dribbling, incontinence MSK- denies joint pain, muscle aches, injury Neuro- denies headache, dizziness, syncope, seizure activity       Objective:    BP (!) 172/98 (BP Location: Left Arm, Patient Position: Sitting, Cuff Size: Normal)   Pulse 88   Temp 98.4 F (36.9 C) (Oral)   Resp 18   Wt 203 lb 6.4 oz (92.3 kg)   BMI 31.86 kg/m  GEN- NAD, alert and oriented x3 ,repeat BP 170/90 HEENT- PERRL, EOMI, non injected sclera, pink conjunctiva, MMM, oropharynx clear Neck- Supple, no thyromegaly CVS- RRR, no murmur RESP-CTAB Psych- stressed appearing tearful at times, not overly anxious, no SI, well groomed, good eye contact Neuro- Good ROm upper ext, normal monofilament hands, normal tone sensation grossly in tact, +phalens EXT- No edema Pulses- Radial, DP- 2+        Assessment & Plan:      Problem List Items Addressed This Visit      Unprioritized   Obesity   OA  (osteoarthritis)   Relevant Medications   HYDROcodone-acetaminophen (NORCO/VICODIN) 5-325 MG tablet   Other Relevant Orders   Drugs of abuse screen w/o alc, rtn urine-sln (Completed)   GAD (generalized anxiety disorder)    Continue xanax for now UDS also done in office Based on protocal Discussed that she also marijana being illegal, showed on a drug screen a few months ago, when she was applying for a job and that she needs to show these levels decreasing out of her system in order to continue with pain medication/benzo's      Relevant Medications   ALPRAZolam (XANAX) 1 MG tablet   Essential hypertension, benign - Primary    Elevated BP today but distressed about work Will have her check BP at home Still elevated increase to 50mg  once a day       Relevant Orders   CBC with Differential/Platelet (Completed)   Comprehensive metabolic panel (Completed)   Lipid panel (Completed)    Other Visit Diagnoses    Need for prophylactic vaccination and inoculation against influenza       Carpal tunnel syndrome of left wrist       trial of wrist splints first    Relevant Medications   ALPRAZolam (XANAX) 1 MG tablet   Long-term use of high-risk medication       Relevant Orders   Drugs of abuse screen w/o alc, rtn urine-sln (Completed)  Flu vaccine need       Relevant Orders   Flu Vaccine QUAD 36+ mos IM (Completed)      Note: This dictation was prepared with Dragon dictation along with smaller phrase technology. Any transcriptional errors that result from this process are unintentional.

## 2016-12-18 NOTE — Patient Instructions (Addendum)
Wear wrist brace for carpal tunnel  Call me in 1 week or email with blood pressure  Continue current medications Flu shot given  F/U 6 months for PHYSICAL

## 2016-12-19 ENCOUNTER — Other Ambulatory Visit: Payer: Self-pay | Admitting: Family Medicine

## 2016-12-19 LAB — DRUGS OF ABUSE SCREEN W/O ALC, ROUTINE URINE
AMPHETAMINES (1000 ng/mL SCRN): NEGATIVE
BARBITURATES: NEGATIVE
BENZODIAZEPINES: NEGATIVE
COCAINE METABOLITES: NEGATIVE
MARIJUANA MET (50 ng/mL SCRN): NEGATIVE
METHADONE: NEGATIVE
METHAQUALONE: NEGATIVE
OPIATES: NEGATIVE
PHENCYCLIDINE: NEGATIVE
PROPOXYPHENE: NEGATIVE

## 2016-12-20 ENCOUNTER — Encounter: Payer: Self-pay | Admitting: Family Medicine

## 2016-12-20 NOTE — Assessment & Plan Note (Addendum)
Elevated BP today but distressed about work Will have her check BP at home Still elevated increase to 50mg  once a day  She will send me readings in 1 week

## 2016-12-20 NOTE — Assessment & Plan Note (Signed)
Continue xanax for now UDS also done in office Based on protocal Discussed that she also marijana being illegal, showed on a drug screen a few months ago, when she was applying for a job and that she needs to show these levels decreasing out of her system in order to continue with pain medication/benzo's

## 2016-12-21 ENCOUNTER — Ambulatory Visit (INDEPENDENT_AMBULATORY_CARE_PROVIDER_SITE_OTHER): Payer: BLUE CROSS/BLUE SHIELD | Admitting: Orthopedic Surgery

## 2016-12-21 NOTE — Telephone Encounter (Signed)
Call placed to patient.   Patient states that she does not take Norco through work week, but would rather take Tramadol if needed. States that she does use Norco on weekends if she needs it.   Ok to fill Tramadol?

## 2016-12-21 NOTE — Telephone Encounter (Signed)
Ok to refill both Tramadol and Xanax?  Last office visit 12/18/2016.

## 2016-12-21 NOTE — Telephone Encounter (Signed)
Not right now due to new laws,  She typically fills them a few months ago  Okay to fill in 3 weeks

## 2016-12-21 NOTE — Telephone Encounter (Signed)
Okay to refill the xanax, just refilled norco, needs to wait a few weeks

## 2016-12-22 ENCOUNTER — Telehealth: Payer: Self-pay | Admitting: *Deleted

## 2016-12-22 NOTE — Telephone Encounter (Signed)
Your information has been submitted and will be reviewed by [employer]. You may close this dialog, return to your dashboard, and perform other tasks. An electronic determination will be received in CoverMyMeds within 72-120 hours. You can see the latest determination by locating this request on your dashboard or by reopening this request. You will receive a fax copy of the determination. If [employer] has not responded in 120 hours, contact [employer] at [phone]. 

## 2016-12-22 NOTE — Telephone Encounter (Signed)
Received call from pharmacy.   Was advised that Xanax 0.5mg  was called in to pharmacy on 12/18/2016 and left on VM by "Almyra Free". New refill called in on 12/21/2016 for Xanax 1mg  by Probation officer. Requested clarification on prescription.   Per chart record, patient has not been on Xanax 0.5mg . Patient has only had refills for Xanax 1mg  since 2014. Unsure of where new prescription for Xanax 0.5mg  came from. Patient has already picked up Xanax 0.5mg . Advised pharmacy to D/C prescription for Xanax 0.5mg .   Patient uses Xanax sparingly. Call placed to patient to advise that she can take (2) tabs of current prescription until supply is exhausted. [place].   MD to be made aware.

## 2016-12-22 NOTE — Telephone Encounter (Signed)
Noted  

## 2016-12-22 NOTE — Telephone Encounter (Signed)
Received request from pharmacy for PA on Hydrocodone.   PA submitted.   Dx: M15.0- Primary OA involving multiple joints

## 2016-12-22 NOTE — Telephone Encounter (Signed)
Patient returned call and made aware.   Will use (2) tabs of xanax 0.5mg  PRN until supply is exhausted.

## 2016-12-23 NOTE — Telephone Encounter (Signed)
Received PA determination.   PA [id number] approved 12/22/2016- 12/22/2017.  Pharmacy made aware.

## 2016-12-24 ENCOUNTER — Encounter: Payer: Self-pay | Admitting: Family Medicine

## 2016-12-25 MED ORDER — LOSARTAN POTASSIUM 50 MG PO TABS: 50.0000 mg | ORAL_TABLET | Freq: Every day | ORAL | 1 refills | Status: DC

## 2016-12-27 ENCOUNTER — Other Ambulatory Visit: Payer: Self-pay | Admitting: Family Medicine

## 2016-12-28 ENCOUNTER — Encounter (INDEPENDENT_AMBULATORY_CARE_PROVIDER_SITE_OTHER): Payer: Self-pay | Admitting: Orthopedic Surgery

## 2016-12-28 ENCOUNTER — Ambulatory Visit (INDEPENDENT_AMBULATORY_CARE_PROVIDER_SITE_OTHER): Payer: Managed Care, Other (non HMO) | Admitting: Orthopedic Surgery

## 2016-12-28 DIAGNOSIS — M76822 Posterior tibial tendinitis, left leg: Secondary | ICD-10-CM

## 2016-12-28 NOTE — Progress Notes (Signed)
Office Visit Note   Patient: Lori Mullins           Date of Birth: [birth year]           MRN: [id number] Visit Date: 12/28/2016              Requested by: [name], MD [address] [place], [place] [address] PCP: [name], MD  Chief Complaint  Patient presents with  . Left Foot - Pain      HPI: Patient is a 51 year old woman who is undergone excellent conservative therapy for posterior tibial tendon insufficiency on the left.  She has custom orthotics she has worn braces she has worn a fracture boot for about 3 months she still has pain with increasing pronation and valgus to the hindfoot with pain along the posterior tibial tendon.  Primary orthopedic surgeon Dr. Aline Brochure  Assessment & Plan: Visit Diagnoses:  1. Posterior tibial tendinitis, left leg     Plan: Place her in a posterior tibial tendon brace and follow-up in 4 weeks.  Discussed that surgical options would include fusion of the toe navicular and subtalar joint.  Patient states that she does not feel like she would ever be interested in surgical intervention.  Reevaluate in 4 weeks.  Follow-Up Instructions: Return in about 4 weeks (around 01/25/2017).   Ortho Exam  Patient is alert, oriented, no adenopathy, well-dressed, normal affect, normal respiratory effort. Examination patient has a good dorsalis pedis and posterior tibial pulse.  She does have subtalar motion she has pronation and valgus of the hindfoot cannot do a single limb heel raise she is tender to palpation of the posterior tibial tendon she has developed a collapse through the talonavicular joint with callus on the plantar aspect of her foot.  She is tender to palpation in the sinus Tarsi with lateral impingement.  Her radiographs are reviewed which shows a congruent tibial talar joint.  Imaging: No results found. No images are attached to the encounter.  Labs: Lab Results  Component Value Date   HGBA1C 5.3 10/02/2014     Orders:  No orders of the defined types were placed in this encounter.  No orders of the defined types were placed in this encounter.    Procedures: No procedures performed  Clinical Data: No additional findings.  ROS:  All other systems negative, except as noted in the HPI. Review of Systems  Objective: Vital Signs: There were no vitals taken for this visit.  Specialty Comments:  No specialty comments available.  PMFS History: Patient Active Problem List   Diagnosis Date Noted  . Essential hypertension, benign 12/16/2011  . Obesity 12/16/2011  . OA (osteoarthritis) 12/16/2011  . GAD (generalized anxiety disorder) 12/16/2011   Past Medical History:  Diagnosis Date  . Anxiety   . Arthritis    Hip/Hands  . Campylobacter intestinal infection 1996   Became systemic  . Hypertension   . OCD (obsessive compulsive disorder) 2007  . Plantar fasciitis    seen by orthoStevie Kern    Family History  Problem Relation Age of Onset  . Hypertension Father     Past Surgical History:  Procedure Laterality Date  . CERVICAL DISCECTOMY    . RETINAL DETACHMENT SURGERY  2005   Social History   Occupational History  . Not on file.   Social History Main Topics  . Smoking status: Former Research scientist (life sciences)  . Smokeless tobacco: Never Used  . Alcohol use 0.0 oz/week  Comment: occasionally  . Drug use: No  . Sexual activity: Yes

## 2016-12-28 NOTE — Telephone Encounter (Signed)
Okay to refill, let her know will n eed to last 30 days

## 2016-12-28 NOTE — Telephone Encounter (Signed)
Prescription sent to pharmacy.

## 2016-12-28 NOTE — Telephone Encounter (Signed)
Ok to refill 

## 2017-01-05 ENCOUNTER — Encounter: Payer: Self-pay | Admitting: Family Medicine

## 2017-01-11 NOTE — Telephone Encounter (Signed)
Medication called to pharmacy. 

## 2017-01-23 ENCOUNTER — Other Ambulatory Visit: Payer: Self-pay | Admitting: Family Medicine

## 2017-01-25 ENCOUNTER — Ambulatory Visit (INDEPENDENT_AMBULATORY_CARE_PROVIDER_SITE_OTHER): Payer: Managed Care, Other (non HMO) | Admitting: Orthopedic Surgery

## 2017-01-25 ENCOUNTER — Encounter (INDEPENDENT_AMBULATORY_CARE_PROVIDER_SITE_OTHER): Payer: Self-pay | Admitting: Orthopedic Surgery

## 2017-01-25 VITALS — Ht 67.0 in | Wt 203.0 lb

## 2017-01-25 DIAGNOSIS — M76822 Posterior tibial tendinitis, left leg: Secondary | ICD-10-CM | POA: Diagnosis not present

## 2017-01-25 NOTE — Progress Notes (Signed)
   Office Visit Note   Patient: Lori Mullins           Date of Birth: Nov 14, 1965           MRN: [id number] Visit Date: 01/25/2017              Requested by: [name], MD [address] [place], [place] [address] PCP: [name], MD  Chief Complaint  Patient presents with  . Left Ankle - Follow-up    Posterior tibial tendonitis       HPI: Patient is a 51 year old woman who presents in follow-up for posterior tibial tendon insufficiency on the left.  She previously has had custom orthotics made she has been in a posterior tibial tendon brace at this time and she states the brace has completely relieve her symptoms.  Assessment & Plan: Visit Diagnoses:  1. Posterior tibial tendinitis, left leg     Plan: She will use the posterior tibial tendon brace as needed she will use sole orthotics if her custom orthotics wear out.  Discussed the possibility of talonavicular and subtalar fusion if she fails conservative treatment.  Follow-Up Instructions: Return if symptoms worsen or fail to improve.   Ortho Exam  Patient is alert, oriented, no adenopathy, well-dressed, normal affect, normal respiratory effort. Examination patient does have a pronator valgus forefoot.  She has no tenderness to palpation along the posterior tibial tendon.  The dorsalis pedis and posterior tibial pulse is good.  There is no redness no cellulitis.  She does have pronation and valgus of the forefoot.  Imaging: No results found. No images are attached to the encounter.  Labs: Lab Results  Component Value Date   HGBA1C 5.3 10/02/2014    @LABSALLVALUES (HGBA1)@  @BMI1 @  Orders:  No orders of the defined types were placed in this encounter.  No orders of the defined types were placed in this encounter.    Procedures: No procedures performed  Clinical Data: No additional findings.  ROS:  All other systems negative, except as noted in the HPI. Review of  Systems  Objective: Vital Signs: Ht 5\' 7"  (1.702 m)   Wt 203 lb (92.1 kg)   BMI 31.79 kg/m   Specialty Comments:  No specialty comments available.  PMFS History: Patient Active Problem List   Diagnosis Date Noted  . Essential hypertension, benign 12/16/2011  . Obesity 12/16/2011  . OA (osteoarthritis) 12/16/2011  . GAD (generalized anxiety disorder) 12/16/2011   Past Medical History:  Diagnosis Date  . Anxiety   . Arthritis    Hip/Hands  . Campylobacter intestinal infection 1996   Became systemic  . Hypertension   . OCD (obsessive compulsive disorder) 2007  . Plantar fasciitis    seen by orthoStevie Kern    Family History  Problem Relation Age of Onset  . Hypertension Father     Past Surgical History:  Procedure Laterality Date  . CERVICAL DISCECTOMY    . RETINAL DETACHMENT SURGERY  2005   Social History   Occupational History  . Not on file  Tobacco Use  . Smoking status: Former Research scientist (life sciences)  . Smokeless tobacco: Never Used  Substance and Sexual Activity  . Alcohol use: Yes    Alcohol/week: 0.0 oz    Comment: occasionally  . Drug use: No  . Sexual activity: Yes

## 2017-01-25 NOTE — Telephone Encounter (Signed)
Ok to refill 

## 2017-01-25 NOTE — Telephone Encounter (Signed)
Prescription sent to pharmacy.

## 2017-01-25 NOTE — Telephone Encounter (Signed)
okay

## 2017-01-26 ENCOUNTER — Other Ambulatory Visit: Payer: Self-pay | Admitting: Family Medicine

## 2017-01-27 NOTE — Telephone Encounter (Signed)
Okay to refill? 

## 2017-01-27 NOTE — Telephone Encounter (Signed)
Ok to refill??  Last office visit 12/18/2016.  Last refill 12/21/2016.

## 2017-01-28 ENCOUNTER — Other Ambulatory Visit: Payer: Self-pay | Admitting: Family Medicine

## 2017-01-28 MED ORDER — DICLOFENAC SODIUM 1 % TD GEL: TRANSDERMAL | 2 refills | Status: DC

## 2017-01-28 MED ORDER — ALPRAZOLAM 1 MG PO TABS: 1.0000 mg | ORAL_TABLET | Freq: Two times a day (BID) | ORAL | 1 refills | Status: DC | PRN

## 2017-01-28 NOTE — Telephone Encounter (Signed)
Medication called to pharmacy. 

## 2017-01-29 ENCOUNTER — Ambulatory Visit (HOSPITAL_COMMUNITY)
Admission: RE | Admit: 2017-01-29 | Discharge: 2017-01-29 | Disposition: A | Discharge status: Home / Self Care | Payer: 59 | Source: Ambulatory Visit | Attending: Family Medicine | Admitting: Family Medicine

## 2017-01-29 DIAGNOSIS — Z1231 Encounter for screening mammogram for malignant neoplasm of breast: Secondary | ICD-10-CM | POA: Diagnosis present

## 2017-02-01 ENCOUNTER — Other Ambulatory Visit: Payer: Self-pay | Admitting: Family Medicine

## 2017-02-01 ENCOUNTER — Encounter: Payer: Self-pay | Admitting: Family Medicine

## 2017-02-01 DIAGNOSIS — N6310 Unspecified lump in the right breast, unspecified quadrant: Secondary | ICD-10-CM

## 2017-02-07 ENCOUNTER — Other Ambulatory Visit: Payer: Self-pay | Admitting: Family Medicine

## 2017-02-08 ENCOUNTER — Other Ambulatory Visit: Payer: Self-pay | Admitting: Family Medicine

## 2017-02-10 NOTE — Telephone Encounter (Signed)
Ok to refill 

## 2017-02-10 NOTE — Telephone Encounter (Signed)
Prescription sent to pharmacy.

## 2017-02-10 NOTE — Telephone Encounter (Signed)
okay

## 2017-02-15 ENCOUNTER — Other Ambulatory Visit: Payer: Self-pay | Admitting: Family Medicine

## 2017-02-15 NOTE — Telephone Encounter (Signed)
She can not have both filled at same time, which does she prefer. Changes with narcotic laws and prescribing  Norco or the ultram at this time

## 2017-02-15 NOTE — Telephone Encounter (Signed)
Ok to refill??  Last office visit 12/18/2016.  Last refill 01/11/2017.

## 2017-02-15 NOTE — Telephone Encounter (Signed)
Ok to refill??  Last office visit/ refill 12/18/2016.

## 2017-02-16 ENCOUNTER — Encounter (HOSPITAL_COMMUNITY): Payer: Self-pay

## 2017-02-16 ENCOUNTER — Ambulatory Visit (HOSPITAL_COMMUNITY)
Admission: RE | Admit: 2017-02-16 | Discharge: 2017-02-16 | Disposition: A | Discharge status: Home / Self Care | Payer: 59 | Source: Ambulatory Visit | Attending: Family Medicine | Admitting: Family Medicine

## 2017-02-16 DIAGNOSIS — N6310 Unspecified lump in the right breast, unspecified quadrant: Secondary | ICD-10-CM

## 2017-02-16 DIAGNOSIS — R928 Other abnormal and inconclusive findings on diagnostic imaging of breast: Secondary | ICD-10-CM | POA: Diagnosis not present

## 2017-02-16 NOTE — Telephone Encounter (Signed)
Call placed to patient and patient made aware.   Patient states that she would like refill on Tramadol at this time.   Refill for hydrocodone denied.

## 2017-02-17 NOTE — Telephone Encounter (Signed)
Medication called to pharmacy. 

## 2017-02-22 ENCOUNTER — Other Ambulatory Visit: Payer: Self-pay | Admitting: *Deleted

## 2017-02-22 MED ORDER — LOSARTAN POTASSIUM 50 MG PO TABS: 50.0000 mg | ORAL_TABLET | Freq: Every day | ORAL | 1 refills | Status: DC

## 2017-03-03 ENCOUNTER — Other Ambulatory Visit: Payer: Self-pay | Admitting: Family Medicine

## 2017-03-04 NOTE — Telephone Encounter (Signed)
Ok to refill??  Last office visit/ refill 12/18/2016

## 2017-03-05 MED ORDER — HYDROCODONE-ACETAMINOPHEN 5-325 MG PO TABS: 1.0000 | ORAL_TABLET | Freq: Four times a day (QID) | ORAL | 0 refills | Status: DC | PRN

## 2017-03-29 ENCOUNTER — Other Ambulatory Visit: Payer: Self-pay | Admitting: Family Medicine

## 2017-03-29 NOTE — Telephone Encounter (Signed)
Ok to refill 

## 2017-04-11 ENCOUNTER — Other Ambulatory Visit: Payer: Self-pay | Admitting: Family Medicine

## 2017-04-12 NOTE — Telephone Encounter (Signed)
Ok to refill 

## 2017-04-26 ENCOUNTER — Other Ambulatory Visit: Payer: Self-pay | Admitting: *Deleted

## 2017-04-26 MED ORDER — DICLOFENAC SODIUM 1 % TD GEL: TRANSDERMAL | 2 refills | Status: DC

## 2017-04-27 ENCOUNTER — Other Ambulatory Visit: Payer: Self-pay | Admitting: Family Medicine

## 2017-04-28 NOTE — Telephone Encounter (Signed)
Requesting refill    tramadol  LOV:  12/18/16 LRF:  02/17/17

## 2017-05-27 ENCOUNTER — Encounter: Payer: Self-pay | Admitting: Family Medicine

## 2017-05-27 ENCOUNTER — Other Ambulatory Visit: Payer: Self-pay | Admitting: Family Medicine

## 2017-05-27 NOTE — Telephone Encounter (Signed)
Ok to refill??  Last office visit 12/18/2016.  Last refill on Flexeril 04/12/2017.  Last refill on Hydrocodone 03/05/2017.

## 2017-05-28 ENCOUNTER — Other Ambulatory Visit: Payer: Self-pay | Admitting: Family Medicine

## 2017-05-28 MED ORDER — HYDROCODONE-ACETAMINOPHEN 5-325 MG PO TABS: 1.0000 | ORAL_TABLET | Freq: Four times a day (QID) | ORAL | 0 refills | Status: DC | PRN

## 2017-05-28 MED ORDER — CYCLOBENZAPRINE HCL 10 MG PO TABS: 10.0000 mg | ORAL_TABLET | Freq: Three times a day (TID) | ORAL | 0 refills | Status: DC | PRN

## 2017-06-22 ENCOUNTER — Other Ambulatory Visit: Payer: Self-pay | Admitting: Family Medicine

## 2017-06-22 NOTE — Telephone Encounter (Signed)
Ok to refill 

## 2017-07-07 ENCOUNTER — Other Ambulatory Visit: Payer: Self-pay | Admitting: Family Medicine

## 2017-07-07 NOTE — Telephone Encounter (Signed)
Ok to refill??  Last office visit 12/18/2016.  Last refill 06/22/2017.

## 2017-07-08 NOTE — Telephone Encounter (Signed)
Ok to refill??  Last office visit  Last refill on Xanax 01/28/2018, #1 refill.   Last refill on Tramadol 04/28/2017, #1 refill.

## 2017-08-07 ENCOUNTER — Other Ambulatory Visit: Payer: Self-pay | Admitting: Family Medicine

## 2017-08-09 NOTE — Telephone Encounter (Signed)
Over due for OV Can not refill Ultram after this until seen, will have to taper off xanax if not seen in next 30days

## 2017-08-09 NOTE — Telephone Encounter (Signed)
Message sent via MyChart.

## 2017-08-09 NOTE — Telephone Encounter (Signed)
Ok to refill??  Last office visit 12/18/2016.  Last refill on Xanax/ Tramadol 07/09/2017.

## 2017-08-20 ENCOUNTER — Ambulatory Visit: Payer: Managed Care, Other (non HMO) | Admitting: Family Medicine

## 2017-08-20 ENCOUNTER — Other Ambulatory Visit: Payer: Self-pay

## 2017-08-20 ENCOUNTER — Encounter: Payer: Self-pay | Admitting: Family Medicine

## 2017-08-20 VITALS — BP 138/82 | HR 96 | Temp 97.9°F | Resp 16 | Ht 67.0 in | Wt 215.0 lb

## 2017-08-20 DIAGNOSIS — M150 Primary generalized (osteo)arthritis: Secondary | ICD-10-CM | POA: Diagnosis not present

## 2017-08-20 DIAGNOSIS — Z79899 Other long term (current) drug therapy: Secondary | ICD-10-CM | POA: Diagnosis not present

## 2017-08-20 DIAGNOSIS — M159 Polyosteoarthritis, unspecified: Secondary | ICD-10-CM

## 2017-08-20 DIAGNOSIS — F411 Generalized anxiety disorder: Secondary | ICD-10-CM

## 2017-08-20 DIAGNOSIS — M8949 Other hypertrophic osteoarthropathy, multiple sites: Secondary | ICD-10-CM

## 2017-08-20 DIAGNOSIS — R0981 Nasal congestion: Secondary | ICD-10-CM

## 2017-08-20 DIAGNOSIS — I10 Essential (primary) hypertension: Secondary | ICD-10-CM | POA: Diagnosis not present

## 2017-08-20 NOTE — Assessment & Plan Note (Signed)
Note she is also having some increase in her hot flashes things have been stressful.  The previous brand of her Hoy Register has changed going to try to see if she can take a higher dose based on what is on the box.  She does not want to be on synthetic hormone secondary to breast cancer in her family.  She declines trying Paxil or Effexor.  For now she will continue the alprazolam

## 2017-08-20 NOTE — Patient Instructions (Signed)
F/U 6 months for Physical  

## 2017-08-20 NOTE — Assessment & Plan Note (Signed)
Chronic pain.  Random drug screen done today.  She was a little upset that I did a drug screen today states that she was not aware.  Reminded her that is why it is random.  Also remind her my concerns about use of marijuana and the fact it is illegal.  She has her own thoughts about the use of marijuana is not understanding why we would find it to be such a big deal.  5 her that she will need to show that she is decreasing her use in order to stay on her scheduled drugs.

## 2017-08-20 NOTE — Progress Notes (Signed)
Subjective:    Patient ID: Lori Mullins, female    DOB: Oct 08, 1965, 52 y.o.   MRN: [id number]  Patient presents for Follow-up (is not fasting) Pt here to f/u chronic medical problems  She is on chronic pain medication for OA- ultram and norco   HTN- taking BP meds as prescribed  GAD-still taking her Xanax as prescribed typically twice a day.  States she has had increased stress with her current job whom she may be leaving.  Does continue to smoke marijuana discussed with her today that this is illegal and she will need to prove that she is discontinuing her use in order to stay on her schedule drugs which include Xanax tramadol and hydrocodone for her pain.  The spot on her nose that is been present for the past couple weeks it is not growing.  Had some nasal congestion that started once he left [place] for a business trip about a week ago.  She has not had any significant drainage.  She did try her Nettie pot.  She is going to try decongestant.  No fever sore throat cough.     Review Of Systems:  GEN- denies fatigue, fever, weight loss,weakness, recent illness HEENT- denies eye drainage, change in vision, nasal discharge, CVS- denies chest pain, palpitations RESP- denies SOB, cough, wheeze ABD- denies N/V, change in stools, abd pain GU- denies dysuria, hematuria, dribbling, incontinence MSK- + joint pain, muscle aches, injury Neuro- denies headache, dizziness, syncope, seizure activity       Objective:    BP 138/82   Pulse 96   Temp 97.9 F (36.6 C) (Oral)   Resp 16   Ht 5\' 7"  (1.702 m)   Wt 215 lb (97.5 kg)   LMP 08/05/2013   SpO2 100%   BMI 33.67 kg/m  GEN- NAD, alert and oriented x3 HEENT- PERRL, EOMI, non injected sclera, pink conjunctiva, MMM, oropharynx clear, nares clear, skin left nares small erythematous papule, no fluctance, NT, no scaling  Neck- Supple, no thyromegaly, no LAD  CVS- RRR, no murmur RESP-CTAB ABD-NABS,soft, NT,ND Psych- stress  appearing, not depressed, no SI EXT- No edema Pulses- Radial, DP- 2+        Assessment & Plan:      Problem List Items Addressed This Visit      Unprioritized   Essential hypertension, benign    Blood pressure was improved some.  She has had a very stressful day she states.  States she is already had 2 Xanax today.  No change to her losartan      GAD (generalized anxiety disorder) - Primary    Note she is also having some increase in her hot flashes things have been stressful.  The previous brand of her Hoy Register has changed going to try to see if she can take a higher dose based on what is on the box.  She does not want to be on synthetic hormone secondary to breast cancer in her family.  She declines trying Paxil or Effexor.  For now she will continue the alprazolam      OA (osteoarthritis)    Chronic pain.  Random drug screen done today.  She was a little upset that I did a drug screen today states that she was not aware.  Reminded her that is why it is random.  Also remind her my concerns about use of marijuana and the fact it is illegal.  She has her own thoughts about the use of marijuana is  not understanding why we would find it to be such a big deal.  5 her that she will need to show that she is decreasing her use in order to stay on her scheduled drugs.       Other Visit Diagnoses    Nasal congestion       Long-term use of high-risk medication       Relevant Orders   Drugs of abuse screen w/o alc, rtn urine-sln   Other/Misc lab test      Note: This dictation was prepared with Dragon dictation along with smaller phrase technology. Any transcriptional errors that result from this process are unintentional.

## 2017-08-20 NOTE — Assessment & Plan Note (Signed)
Blood pressure was improved some.  She has had a very stressful day she states.  States she is already had 2 Xanax today.  No change to her losartan

## 2017-08-25 ENCOUNTER — Encounter: Payer: Self-pay | Admitting: Family Medicine

## 2017-08-25 MED ORDER — HYDROCODONE-ACETAMINOPHEN 5-325 MG PO TABS: 1.0000 | ORAL_TABLET | Freq: Four times a day (QID) | ORAL | 0 refills | Status: DC | PRN

## 2017-08-25 NOTE — Addendum Note (Signed)
Addended by: Vic Blackbird F on: 08/25/2017 01:38 PM   Modules accepted: Orders

## 2017-08-26 ENCOUNTER — Encounter: Payer: Self-pay | Admitting: Family Medicine

## 2017-08-26 LAB — DRUGS OF ABUSE SCREEN W/O ALC, ROUTINE URINE
AMPHETAMINES (1000 ng/mL SCRN): NEGATIVE
BARBITURATES: NEGATIVE
BENZODIAZEPINES: POSITIVE — AB
COCAINE METABOLITES: NEGATIVE
MARIJUANA MET (50 NG/ML SCRN): POSITIVE — AB
METHADONE: NEGATIVE
METHAQUALONE: NEGATIVE
OPIATES: NEGATIVE
PHENCYCLIDINE: NEGATIVE
PROPOXYPHENE: NEGATIVE

## 2017-08-26 LAB — PAIN MGMT, TRAMADOL QN, U
Desmethyltramadol: 836 ng/mL — ABNORMAL HIGH (ref <100)
Tramadol: 655 ng/mL — ABNORMAL HIGH (ref <100)

## 2017-09-11 ENCOUNTER — Other Ambulatory Visit: Payer: Self-pay | Admitting: Family Medicine

## 2017-09-13 NOTE — Telephone Encounter (Signed)
Ok to refill Xanax and Tramadol??  Last office visit 08/20/2017.  Last refill 08/09/2017 on both.

## 2017-10-15 ENCOUNTER — Other Ambulatory Visit: Payer: Self-pay | Admitting: Family Medicine

## 2017-10-18 NOTE — Telephone Encounter (Signed)
Ok to refill??  Last office visit 08/20/2017  Last refill on Alprazolam 09/13/2017.  Last refill on Tramadol 09/13/2017.

## 2017-10-26 ENCOUNTER — Other Ambulatory Visit: Payer: Self-pay | Admitting: Family Medicine

## 2017-10-26 MED ORDER — HYDROCODONE-ACETAMINOPHEN 5-325 MG PO TABS: 1.0000 | ORAL_TABLET | Freq: Four times a day (QID) | ORAL | 0 refills | Status: DC | PRN

## 2017-10-26 MED ORDER — CYCLOBENZAPRINE HCL 10 MG PO TABS: 10.0000 mg | ORAL_TABLET | Freq: Three times a day (TID) | ORAL | 0 refills | Status: DC | PRN

## 2017-10-26 NOTE — Telephone Encounter (Signed)
Ok to refill??  Last office visit 08/20/2017.  Last refill 08/25/2017.

## 2017-10-26 NOTE — Telephone Encounter (Signed)
Ok to refill 

## 2017-11-07 ENCOUNTER — Other Ambulatory Visit: Payer: Self-pay | Admitting: Family Medicine

## 2017-11-08 NOTE — Telephone Encounter (Signed)
Ok to refill 

## 2017-11-16 ENCOUNTER — Other Ambulatory Visit: Payer: Self-pay | Admitting: Family Medicine

## 2017-11-16 NOTE — Telephone Encounter (Signed)
Ok to refill??  Last office visit 08/20/2017.  Last refill 10/18/2017.

## 2017-11-21 ENCOUNTER — Other Ambulatory Visit: Payer: Self-pay | Admitting: Family Medicine

## 2017-11-22 NOTE — Telephone Encounter (Signed)
Requesting refill    Flexeril  LOV: 08/20/17  LRF:  11/08/17

## 2017-12-14 ENCOUNTER — Other Ambulatory Visit: Payer: Self-pay | Admitting: Family Medicine

## 2017-12-15 NOTE — Telephone Encounter (Signed)
Requesting refill      LOV: 08/20/17  LRF:  Hydrocodone - 10/26/17 - Flexeril - 11/22/17 - Xanax - 11/16/17

## 2017-12-28 ENCOUNTER — Other Ambulatory Visit: Payer: Self-pay | Admitting: Family Medicine

## 2017-12-28 ENCOUNTER — Encounter: Payer: Self-pay | Admitting: Family Medicine

## 2017-12-29 MED ORDER — HYDROCODONE-ACETAMINOPHEN 5-325 MG PO TABS: 1.0000 | ORAL_TABLET | Freq: Four times a day (QID) | ORAL | 0 refills | Status: DC | PRN

## 2018-01-10 ENCOUNTER — Other Ambulatory Visit: Payer: Self-pay | Admitting: Family Medicine

## 2018-01-11 NOTE — Telephone Encounter (Signed)
Ok to refill 

## 2018-01-17 ENCOUNTER — Other Ambulatory Visit: Payer: Self-pay | Admitting: Family Medicine

## 2018-01-17 NOTE — Telephone Encounter (Signed)
Ok to refill??  Last office visit 08/20/2017.  Last refill on Xanax 12/16/2017.  Last refill on Tramadol 12/16/2017.

## 2018-01-30 ENCOUNTER — Other Ambulatory Visit: Payer: Self-pay | Admitting: Family Medicine

## 2018-01-31 NOTE — Telephone Encounter (Signed)
Requesting refill    Flexeril  LOV: 08/20/17  LRF:  01/11/18

## 2018-02-18 ENCOUNTER — Other Ambulatory Visit: Payer: Self-pay

## 2018-02-18 ENCOUNTER — Ambulatory Visit (INDEPENDENT_AMBULATORY_CARE_PROVIDER_SITE_OTHER): Payer: Managed Care, Other (non HMO) | Admitting: Family Medicine

## 2018-02-18 ENCOUNTER — Encounter: Payer: Self-pay | Admitting: Family Medicine

## 2018-02-18 ENCOUNTER — Other Ambulatory Visit: Payer: Self-pay | Admitting: Family Medicine

## 2018-02-18 VITALS — BP 132/62 | HR 98 | Temp 98.4°F | Resp 16 | Ht 67.0 in | Wt 215.0 lb

## 2018-02-18 DIAGNOSIS — Z0000 Encounter for general adult medical examination without abnormal findings: Secondary | ICD-10-CM

## 2018-02-18 DIAGNOSIS — M159 Polyosteoarthritis, unspecified: Secondary | ICD-10-CM

## 2018-02-18 DIAGNOSIS — N841 Polyp of cervix uteri: Secondary | ICD-10-CM

## 2018-02-18 DIAGNOSIS — Z23 Encounter for immunization: Secondary | ICD-10-CM | POA: Diagnosis not present

## 2018-02-18 DIAGNOSIS — I10 Essential (primary) hypertension: Secondary | ICD-10-CM | POA: Diagnosis not present

## 2018-02-18 DIAGNOSIS — M150 Primary generalized (osteo)arthritis: Secondary | ICD-10-CM

## 2018-02-18 DIAGNOSIS — E6609 Other obesity due to excess calories: Secondary | ICD-10-CM | POA: Diagnosis not present

## 2018-02-18 DIAGNOSIS — Z1239 Encounter for other screening for malignant neoplasm of breast: Secondary | ICD-10-CM

## 2018-02-18 DIAGNOSIS — Z6833 Body mass index (BMI) 33.0-33.9, adult: Secondary | ICD-10-CM

## 2018-02-18 DIAGNOSIS — F411 Generalized anxiety disorder: Secondary | ICD-10-CM

## 2018-02-18 DIAGNOSIS — Z124 Encounter for screening for malignant neoplasm of cervix: Secondary | ICD-10-CM

## 2018-02-18 DIAGNOSIS — M8949 Other hypertrophic osteoarthropathy, multiple sites: Secondary | ICD-10-CM

## 2018-02-18 LAB — CBC WITH DIFFERENTIAL/PLATELET
Absolute Monocytes: 672 cells/uL (ref 200–950)
BASOS ABS: 74 {cells}/uL (ref 0–200)
Basophils Relative: 0.9 %
Eosinophils Absolute: 377 cells/uL (ref 15–500)
Eosinophils Relative: 4.6 %
HEMATOCRIT: 44 % (ref 35.0–45.0)
HEMOGLOBIN: 15 g/dL (ref 11.7–15.5)
LYMPHS ABS: 1935 {cells}/uL (ref 850–3900)
MCH: 30.2 pg (ref 27.0–33.0)
MCHC: 34.1 g/dL (ref 32.0–36.0)
MCV: 88.7 fL (ref 80.0–100.0)
MPV: 10.5 fL (ref 7.5–12.5)
Monocytes Relative: 8.2 %
NEUTROS ABS: 5141 {cells}/uL (ref 1500–7800)
Neutrophils Relative %: 62.7 %
Platelets: 296 10*3/uL (ref 140–400)
RBC: 4.96 10*6/uL (ref 3.80–5.10)
RDW: 12 % (ref 11.0–15.0)
Total Lymphocyte: 23.6 %
WBC: 8.2 10*3/uL (ref 3.8–10.8)

## 2018-02-18 LAB — COMPREHENSIVE METABOLIC PANEL
AG RATIO: 1.9 (calc) (ref 1.0–2.5)
ALT: 15 U/L (ref 6–29)
AST: 18 U/L (ref 10–35)
Albumin: 4.7 g/dL (ref 3.6–5.1)
Alkaline phosphatase (APISO): 91 U/L (ref 33–130)
BILIRUBIN TOTAL: 0.5 mg/dL (ref 0.2–1.2)
BUN: 17 mg/dL (ref 7–25)
CO2: 25 mmol/L (ref 20–32)
Calcium: 10.1 mg/dL (ref 8.6–10.4)
Chloride: 105 mmol/L (ref 98–110)
Creat: 0.77 mg/dL (ref 0.50–1.05)
Globulin: 2.5 g/dL (calc) (ref 1.9–3.7)
Glucose, Bld: 87 mg/dL (ref 65–99)
Potassium: 4.5 mmol/L (ref 3.5–5.3)
SODIUM: 142 mmol/L (ref 135–146)
TOTAL PROTEIN: 7.2 g/dL (ref 6.1–8.1)

## 2018-02-18 LAB — LIPID PANEL
Cholesterol: 148 mg/dL (ref <200)
HDL: 81 mg/dL (ref >50)
LDL Cholesterol (Calc): 52 mg/dL (calc)
Non-HDL Cholesterol (Calc): 67 mg/dL (calc) (ref <130)
TRIGLYCERIDES: 73 mg/dL (ref <150)
Total CHOL/HDL Ratio: 1.8 (calc) (ref <5.0)

## 2018-02-18 NOTE — Progress Notes (Signed)
Subjective:    Patient ID: Lori Mullins, female    DOB: May 30, 1965, 52 y.o.   MRN: [id number]  Patient presents for Gynecologic Exam (is fasting)  Pt here for CPE Due for PAP Smear  Mammogram- last in 2018 Colon cancer screening - Due Immunizations- Due for TDAP  Audit C /FALL /Depression negative  Obesity- no weight loss since last visit 6 months ago    Generalized anxiety - taking xanax as needed   Chronic pain/osteoarthritis- taking ultram and norco for severe pain, has flexeril muscle relaxer and voltaren gel    HTN- taking losartan as prescribed, no side effects  Review Of Systems:  GEN- denies fatigue, fever, weight loss,weakness, recent illness HEENT- denies eye drainage, change in vision, nasal discharge, CVS- denies chest pain, palpitations RESP- denies SOB, cough, wheeze ABD- denies N/V, change in stools, abd pain GU- denies dysuria, hematuria, dribbling, incontinence MSK- denies joint pain, muscle aches, injury Neuro- denies headache, dizziness, syncope, seizure activity       Objective:    BP 132/62   Pulse 98   Temp 98.4 F (36.9 C) (Oral)   Resp 16   Ht 5\' 7"  (1.702 m)   Wt 215 lb (97.5 kg)   LMP 03/01/2017   SpO2 96%   BMI 33.67 kg/m  GEN- NAD, alert and oriented x3 HEENT- PERRL, EOMI, non injected sclera, pink conjunctiva, MMM, oropharynx clear Neck- Supple, no thyromegaly CVS- RRR, no murmur RESP-CTAB Breast- normal symmetry, no nipple inversion,no nipple drainage, no nodules or lumps felt Nodes- no axillary nodes ABD-NABS,soft,NT,ND GU- normal external genitalia, vaginal mucosa pink and moist, cervix visualized + polyp  no blood form os, no discharge, no CMT, no ovarian masses, uterus normal size Psych- normal affect and mood  FOBT neg, no external lesions  EXT- No edema Pulses- Radial, DP- 2+        Assessment & Plan:      Problem List Items Addressed This Visit      Unprioritized   Essential hypertension, benign   Controlled no changes       Relevant Orders   CBC with Differential/Platelet   Comprehensive metabolic panel   Lipid panel   GAD (generalized anxiety disorder)    Continue current meds       OA (osteoarthritis)   Obesity    Work on dietary changes, exercise        Other Visit Diagnoses    Routine general medical examination at a health care facility    -  Primary   CPE Done, schedule mammogram, cologuard info given, tdap given   Relevant Orders   Tdap vaccine greater than or equal to 7yo IM (Completed)   Cervical cancer screening       Relevant Orders   Pap IG w/ reflex to HPV when ASC-U   Breast cancer screening       Relevant Orders   MM 3D SCREEN BREAST BILATERAL   Need for tetanus, diphtheria, and acellular pertussis (Tdap) vaccine in patient of adolescent age or older       Relevant Orders   Tdap vaccine greater than or equal to 7yo IM (Completed)   Cervical polyp       She is aware has been present for years, states it was frozen and biopsied, no abnormal bleeding      Note: This dictation was prepared with Dragon dictation along with smaller phrase technology. Any transcriptional errors that result from this process are unintentional.

## 2018-02-18 NOTE — Assessment & Plan Note (Signed)
Work on dietary changes, exercise

## 2018-02-18 NOTE — Telephone Encounter (Signed)
Ok to refill??  Last office visit 02/18/2018.  Last refill 01/17/2018.

## 2018-02-18 NOTE — Patient Instructions (Signed)
I recommend eye visit once a year I recommend dental visit every 6 months Goal is to  Exercise 30 minutes 5 days a week We will send a letter with lab results  F/U 6 months

## 2018-02-18 NOTE — Assessment & Plan Note (Signed)
Controlled no changes 

## 2018-02-18 NOTE — Assessment & Plan Note (Signed)
Continue current meds 

## 2018-02-21 ENCOUNTER — Encounter: Payer: Self-pay | Admitting: Family Medicine

## 2018-02-21 ENCOUNTER — Telehealth: Payer: Self-pay | Admitting: *Deleted

## 2018-02-21 DIAGNOSIS — Z1212 Encounter for screening for malignant neoplasm of rectum: Secondary | ICD-10-CM

## 2018-02-21 DIAGNOSIS — Z1211 Encounter for screening for malignant neoplasm of colon: Secondary | ICD-10-CM

## 2018-02-21 DIAGNOSIS — R195 Other fecal abnormalities: Secondary | ICD-10-CM

## 2018-02-21 NOTE — Telephone Encounter (Signed)
Received verbal orders for Cologuard.   Order placed via [facility].   Cologuard (Order [id number])

## 2018-02-22 LAB — PAP IG W/ RFLX HPV ASCU

## 2018-02-24 ENCOUNTER — Encounter: Payer: Self-pay | Admitting: *Deleted

## 2018-03-03 ENCOUNTER — Other Ambulatory Visit: Payer: Self-pay | Admitting: Family Medicine

## 2018-03-03 NOTE — Telephone Encounter (Signed)
Ok to refill??  Last office visit 02/18/2018.  Last refill 12/29/2017.

## 2018-03-04 MED ORDER — HYDROCODONE-ACETAMINOPHEN 5-325 MG PO TABS: 1.0000 | ORAL_TABLET | Freq: Four times a day (QID) | ORAL | 0 refills | Status: DC | PRN

## 2018-03-11 ENCOUNTER — Other Ambulatory Visit: Payer: Self-pay | Admitting: Family Medicine

## 2018-03-12 ENCOUNTER — Other Ambulatory Visit: Payer: Self-pay | Admitting: Family Medicine

## 2018-03-15 LAB — COLOGUARD: Cologuard: POSITIVE — AB

## 2018-03-19 NOTE — Addendum Note (Signed)
Addended by: Sheral Flow on: 03/19/2018 12:10 PM   Modules accepted: Orders

## 2018-03-19 NOTE — Telephone Encounter (Signed)
Received Cologuard results.   Noted positive.   Colonoscopy ordered.

## 2018-03-23 ENCOUNTER — Other Ambulatory Visit: Payer: Self-pay | Admitting: Family Medicine

## 2018-03-23 NOTE — Telephone Encounter (Signed)
Requesting refill    Tramadol & Xanax  LOV: 02/18/18  LRF: 02/18/18

## 2018-03-29 ENCOUNTER — Encounter: Payer: Self-pay | Admitting: Internal Medicine

## 2018-03-30 ENCOUNTER — Encounter: Payer: Self-pay | Admitting: Nurse Practitioner

## 2018-04-18 ENCOUNTER — Ambulatory Visit (HOSPITAL_COMMUNITY)
Admission: RE | Admit: 2018-04-18 | Discharge: 2018-04-18 | Disposition: A | Discharge status: Home / Self Care | Payer: Managed Care, Other (non HMO) | Source: Ambulatory Visit | Attending: Family Medicine | Admitting: Family Medicine

## 2018-04-18 DIAGNOSIS — Z1231 Encounter for screening mammogram for malignant neoplasm of breast: Secondary | ICD-10-CM | POA: Diagnosis present

## 2018-04-18 DIAGNOSIS — Z1239 Encounter for other screening for malignant neoplasm of breast: Secondary | ICD-10-CM

## 2018-04-20 ENCOUNTER — Other Ambulatory Visit: Payer: Self-pay | Admitting: Family Medicine

## 2018-04-20 NOTE — Telephone Encounter (Signed)
Ok to refill??  Last office visit 02/18/2018.  Last refill 03/23/2018 on both.

## 2018-05-02 ENCOUNTER — Other Ambulatory Visit: Payer: Self-pay | Admitting: Family Medicine

## 2018-05-02 NOTE — Telephone Encounter (Signed)
Ok to refill 

## 2018-05-20 ENCOUNTER — Other Ambulatory Visit: Payer: Self-pay | Admitting: Family Medicine

## 2018-05-20 MED ORDER — LOSARTAN POTASSIUM 100 MG PO TABS: ORAL_TABLET | ORAL | 1 refills | Status: DC

## 2018-05-20 MED ORDER — HYDROCODONE-ACETAMINOPHEN 5-325 MG PO TABS: 1.0000 | ORAL_TABLET | Freq: Four times a day (QID) | ORAL | 0 refills | Status: DC | PRN

## 2018-05-20 MED ORDER — ALPRAZOLAM 1 MG PO TABS: 1.0000 mg | ORAL_TABLET | Freq: Two times a day (BID) | ORAL | 0 refills | Status: DC | PRN

## 2018-05-20 MED ORDER — TRAMADOL HCL 50 MG PO TABS: 50.0000 mg | ORAL_TABLET | Freq: Two times a day (BID) | ORAL | 0 refills | Status: DC | PRN

## 2018-05-20 NOTE — Telephone Encounter (Signed)
Last office visit: 03/04/2018 Last refill: Xanax: 04/20/2018                 Hydrocodone: 03/04/2018

## 2018-05-20 NOTE — Telephone Encounter (Signed)
Last office visit: 02/18/2018 Last medication refilled: 04/20/2018

## 2018-06-01 ENCOUNTER — Other Ambulatory Visit: Payer: Self-pay | Admitting: Family Medicine

## 2018-06-01 ENCOUNTER — Ambulatory Visit: Payer: Self-pay | Admitting: Nurse Practitioner

## 2018-06-01 NOTE — Telephone Encounter (Signed)
Ok to refill 

## 2018-06-08 ENCOUNTER — Encounter: Payer: Self-pay | Admitting: Nurse Practitioner

## 2018-06-08 ENCOUNTER — Other Ambulatory Visit: Payer: Self-pay

## 2018-06-08 ENCOUNTER — Encounter: Payer: Self-pay | Admitting: Internal Medicine

## 2018-06-08 ENCOUNTER — Telehealth: Payer: Self-pay | Admitting: *Deleted

## 2018-06-08 ENCOUNTER — Ambulatory Visit (INDEPENDENT_AMBULATORY_CARE_PROVIDER_SITE_OTHER): Payer: Managed Care, Other (non HMO) | Admitting: Nurse Practitioner

## 2018-06-08 DIAGNOSIS — R195 Other fecal abnormalities: Secondary | ICD-10-CM | POA: Diagnosis not present

## 2018-06-08 NOTE — Telephone Encounter (Signed)
Noted  

## 2018-06-08 NOTE — Assessment & Plan Note (Signed)
The patient presented on referral from primary care for positive Cologuard.  She states she did have a hemorrhoid around that time.  She is generally asymptomatic from a GI standpoint.  She is not wanting to have a colonoscopy in the near future due to the current COVID-19 pandemic.  She is amendable to a colonoscopy after the COVID-19 risk is decreased.  At this point we will put her on the books for colonoscopy on propofol/MAC.  Scheduling to be determined based on health system guidance for "elective procedures".  In general we will have her follow-up in 6 months to ensure she does not fall through the cracks.  Proceed with TCS on propofol/MAC with Dr. [name] in near future: the risks, benefits, and alternatives have been discussed with the patient in detail. The patient states understanding and desires to proceed.  Patient is currently on Xanax, Ultram, hydrocodone.  No other anticoagulants, anxiolytics, chronic pain medications, or antidepressants.  We will plan for the procedure on propofol/MAC to promote adequate sedation.

## 2018-06-08 NOTE — Telephone Encounter (Signed)
Called patient to schedule TCS w/ mac W. RMR in July. She states she was told she could put this off for 6 months and that is what she is going to do. FYI to EG

## 2018-06-08 NOTE — Telephone Encounter (Signed)
Noted. Initially recommended TCS when able to be scheduled and office f/u in 6 months. If she's wanting to wait, that is fine.

## 2018-06-08 NOTE — Progress Notes (Signed)
CC'ED TO PCP 

## 2018-06-08 NOTE — Progress Notes (Signed)
Referring Provider: [name], MD Primary Care Physician:  [name], MD Primary GI:  Dr. [name]  NOTE: Service was provided via telemedicine and was requested by the patient due to COVID-19 pandemic.  Method of visit: FaceTime  Patient Location: Work  Provider Location: Office  Reason for Phone Visit: Evaluation for +Cologuard  The patient was consented to phone follow-up via telephone encounter including billing of the encounter (yes/no): yes  Persons present on the phone encounter, with roles: None  Total time (minutes) spent on medical discussion: 18 minutes  Chief Complaint  Patient presents with  . +Cologuard    never had tcs    HPI:   Lori Mullins is a 53 y.o. female who presents for virtual visit regarding: Referral from primary care for positive Cologuard.  Reviewed information provided with referral including office visit with primary care dated 02/18/2018 for routine general medical exam.  At that time for colorectal cancer screening patient was given a Cologuard test.  Cologuard results dated 03/13/2018 and confirmed to be positive.  The patient was subsequently referred to GI.  No history of colonoscopy in our system.  Today she states she's doing well overall. She states she had a hemorrhoid at the time of the test. Denies abdominal pain, N/V, fever, chills, unintentional weight loss. Has regular bowel movements. Denies hematochezia, melena. Denies URI symptoms or flu-like symptoms. Denies chest pain, dyspnea, dizziness, lightheadedness, syncope, near syncope. Denies any other upper or lower GI symptoms.  Chronic pain from arthritis on Ultram when she needs to work, [employer] on weekends.  Past Medical History:  Diagnosis Date  . Anxiety   . Arthritis    Hip/Hands  . Campylobacter intestinal infection 1996   Became systemic  . Hypertension   . OCD (obsessive compulsive disorder) 2007  . Plantar fasciitis    seen by orthoStevie Kern    Past  Surgical History:  Procedure Laterality Date  . CERVICAL DISCECTOMY    . RETINAL DETACHMENT SURGERY  2005    Current Outpatient Medications  Medication Sig Dispense Refill  . ALPRAZolam (XANAX) 1 MG tablet TAKE 1 TABLET BY MOUTH TWICE A DAY AS NEEDED 60 tablet 0  . ALPRAZolam (XANAX) 1 MG tablet Take 1 tablet (1 mg total) by mouth 2 (two) times daily as needed. 60 tablet 0  . Calcium Carb-Cholecalciferol (CALCIUM-VITAMIN D) 600-400 MG-UNIT TABS Take by mouth daily.     . cyclobenzaprine (FLEXERIL) 10 MG tablet TAKE 1 TABLET BY MOUTH THREE TIMES A DAY AS NEEDED FOR MUSCLE SPASMS 45 tablet 1  . diclofenac sodium (VOLTAREN) 1 % GEL APPLY 2 GRAMS TO AFFECTED AREA 4 TIMES A DAY AS DIRECTED (Patient taking differently: as needed. APPLY 2 GRAMS TO AFFECTED AREA 4 TIMES A DAY AS DIRECTED) 100 g 2  . HYDROcodone-acetaminophen (NORCO/VICODIN) 5-325 MG tablet Take 1 tablet by mouth every 6 (six) hours as needed. 30 tablet 0  . loratadine (CLARITIN) 10 MG tablet Take 10 mg by mouth daily.    [name] losartan (COZAAR) 100 MG tablet Take 0.5 tablets by mouth daily 45 tablet 1  . losartan (COZAAR) 50 MG tablet TAKE 1 TABLET BY MOUTH EVERY DAY 90 tablet 1  . Misc Natural Products (ESTROVEN ENERGY PO) Take by mouth.    . Multiple Vitamins-Minerals (MULTIVITAMIN PO) Take by mouth daily.     [name] OVER THE COUNTER MEDICATION Full spectrum CBD Supplement    . OVER THE COUNTER MEDICATION [place] Women's Supplement    .  traMADol (ULTRAM) 50 MG tablet TAKE 1 TABLET BY MOUTH TWICE A DAY AS NEEDED FOR PAIN 60 tablet 0  . traMADol (ULTRAM) 50 MG tablet Take 1 tablet (50 mg total) by mouth 2 (two) times daily as needed. for pain 60 tablet 0   No current facility-administered medications for this visit.     Allergies as of 06/08/2018  . (No Known Allergies)    Family History  Problem Relation Age of Onset  . Hypertension Father   . Colon cancer Neg Hx   . Colon polyps Neg Hx     Social History   Socioeconomic History   . Marital status: Married    Spouse name: Not on file  . Number of children: Not on file  . Years of education: Not on file  . Highest education level: Not on file  Occupational History  . Not on file  Social Needs  . Financial resource strain: Not on file  . Food insecurity:    Worry: Not on file    Inability: Not on file  . Transportation needs:    Medical: Not on file    Non-medical: Not on file  Tobacco Use  . Smoking status: Former Research scientist (life sciences)  . Smokeless tobacco: Never Used  Substance and Sexual Activity  . Alcohol use: Yes    Alcohol/week: 0.0 standard drinks    Comment: occasionally  . Drug use: No  . Sexual activity: Yes  Lifestyle  . Physical activity:    Days per week: Not on file    Minutes per session: Not on file  . Stress: Not on file  Relationships  . Social connections:    Talks on phone: Not on file    Gets together: Not on file    Attends religious service: Not on file    Active member of club or organization: Not on file    Attends meetings of clubs or organizations: Not on file    Relationship status: Not on file  Other Topics Concern  . Not on file  Social History Narrative  . Not on file    Review of Systems: General: Negative for anorexia, weight loss, fever, chills, fatigue, weakness. ENT: Negative for hoarseness, difficulty swallowing. CV: Negative for chest pain, angina, palpitations, peripheral edema.  Respiratory: Negative for dyspnea at rest, cough, sputum, wheezing.  GI: See history of present illness. MS: Chronic arthritis pain.  Derm: Negative for rash or itching.  Endo: Negative for unusual weight change.  Heme: Negative for bruising or bleeding. Allergy: Negative for rash or hives.  Physical Exam: Note: limited exam due to virtual visit General:   Alert and oriented. Pleasant and cooperative. Well-nourished and well-developed.  Head:  Normocephalic and atraumatic. Eyes:  Without icterus, sclera clear and conjunctiva pink.   Ears:  Normal auditory acuity. Skin:  Intact without facial significant lesions or rashes. Neurologic:  Alert and oriented x4;  grossly normal neurologically. Psych:  Alert and cooperative. Normal mood and affect. Heme/Lymph/Immune: No excessive bruising noted.

## 2018-06-08 NOTE — Patient Instructions (Signed)
Your health issues we discussed today were:   Positive Cologuard/need for colonoscopy: 1. I am glad you are not having any symptoms at this time 2. We will plan to complete a colonoscopy with scheduling to be determined based on the COVID-19 pandemic 3. Call us if you have any worsening or severe symptoms such as active rectal bleeding  Overall I recommend:  1. Follow-up in 6 months to ensure you do not "fall through the cracks" with your colonoscopy 2. Call us if you have any questions or concerns.   Because of recent events of COVID-19 ("Coronavirus"), follow CDC recommendations:  1. Wash your hand frequently 2. Avoid touching your face 3. Stay away from people who are sick 4. If you have symptoms such as fever, cough, shortness of breath then call your healthcare provider for further guidance 5. If you are sick, STAY AT HOME unless otherwise directed by your healthcare provider. 6. Follow directions from state and national officials regarding staying safe   At [facility] we value your feedback. You may receive a survey about your visit today. Please share your experience as we strive to create trusting relationships with our patients to provide genuine, compassionate, quality care.  We appreciate your understanding and patience as we review any laboratory studies, imaging, and other diagnostic tests that are ordered as we care for you. Our office policy is 5 business days for review of these results, and any emergent or urgent results are addressed in a timely manner for your best interest. If you do not hear from our office in 1 week, please contact us.   We also encourage the use of MyChart, which contains your medical information for your review as well. If you are not enrolled in this feature, an access code is on this after visit summary for your convenience. Thank you for allowing [place] to be involved in your care.  It was great to see you today!  I hope you have a  great day!!

## 2018-06-30 ENCOUNTER — Other Ambulatory Visit: Payer: Self-pay | Admitting: Family Medicine

## 2018-06-30 NOTE — Telephone Encounter (Signed)
Ok to refill 

## 2018-07-14 ENCOUNTER — Other Ambulatory Visit: Payer: Self-pay | Admitting: Family Medicine

## 2018-07-14 NOTE — Telephone Encounter (Signed)
Ok to refill??  Last office visit 02/18/2018.  Last refill on Xanax 05/20/2018.  Last refill on Hydrocodone/APAP 05/20/2018.  Last refill on Tramadol 05/20/2018.

## 2018-07-15 MED ORDER — TRAMADOL HCL 50 MG PO TABS: 50.0000 mg | ORAL_TABLET | Freq: Two times a day (BID) | ORAL | 0 refills | Status: DC | PRN

## 2018-07-15 MED ORDER — HYDROCODONE-ACETAMINOPHEN 5-325 MG PO TABS: 1.0000 | ORAL_TABLET | Freq: Four times a day (QID) | ORAL | 0 refills | Status: DC | PRN

## 2018-07-15 MED ORDER — ALPRAZOLAM 1 MG PO TABS: 1.0000 mg | ORAL_TABLET | Freq: Two times a day (BID) | ORAL | 0 refills | Status: DC | PRN

## 2018-07-29 ENCOUNTER — Other Ambulatory Visit: Payer: Self-pay | Admitting: Family Medicine

## 2018-08-19 ENCOUNTER — Encounter: Payer: Self-pay | Admitting: Family Medicine

## 2018-08-19 ENCOUNTER — Ambulatory Visit (INDEPENDENT_AMBULATORY_CARE_PROVIDER_SITE_OTHER): Payer: Managed Care, Other (non HMO) | Admitting: Family Medicine

## 2018-08-19 ENCOUNTER — Other Ambulatory Visit: Payer: Self-pay

## 2018-08-19 ENCOUNTER — Other Ambulatory Visit: Payer: Self-pay | Admitting: Family Medicine

## 2018-08-19 VITALS — BP 142/80 | HR 110 | Temp 98.4°F | Resp 14 | Ht 67.0 in | Wt 221.0 lb

## 2018-08-19 DIAGNOSIS — I10 Essential (primary) hypertension: Secondary | ICD-10-CM | POA: Diagnosis not present

## 2018-08-19 DIAGNOSIS — M8949 Other hypertrophic osteoarthropathy, multiple sites: Secondary | ICD-10-CM

## 2018-08-19 DIAGNOSIS — M159 Polyosteoarthritis, unspecified: Secondary | ICD-10-CM

## 2018-08-19 DIAGNOSIS — M150 Primary generalized (osteo)arthritis: Secondary | ICD-10-CM | POA: Diagnosis not present

## 2018-08-19 DIAGNOSIS — F411 Generalized anxiety disorder: Secondary | ICD-10-CM

## 2018-08-19 DIAGNOSIS — Z6836 Body mass index (BMI) 36.0-36.9, adult: Secondary | ICD-10-CM

## 2018-08-19 MED ORDER — ALPRAZOLAM 1 MG PO TABS: 1.0000 mg | ORAL_TABLET | Freq: Two times a day (BID) | ORAL | 0 refills | Status: DC | PRN

## 2018-08-19 MED ORDER — CYCLOBENZAPRINE HCL 10 MG PO TABS: ORAL_TABLET | ORAL | 1 refills | Status: DC

## 2018-08-19 MED ORDER — HYDROCODONE-ACETAMINOPHEN 5-325 MG PO TABS: 1.0000 | ORAL_TABLET | Freq: Four times a day (QID) | ORAL | 0 refills | Status: DC | PRN

## 2018-08-19 MED ORDER — BUSPIRONE HCL 7.5 MG PO TABS: 7.5000 mg | ORAL_TABLET | Freq: Two times a day (BID) | ORAL | 3 refills | Status: DC

## 2018-08-19 NOTE — Progress Notes (Signed)
   Subjective:    Patient ID: Lori Mullins, female    DOB: 10/30/1965, 53 y.o.   MRN: [id number]  Patient presents for Follow-up (is not fasting)   Pt here to f/u chronic medical problems Medications reviewed    HTN- taking losartan   Chronic pain- taking ultram/norco    GAD- taking xanax but has been very stressed in setting of COVID and her work, her nerves are always bad, request something not an anti-depressant to help as she is using the xanax but with her pain pills trying not to mix too many things  sleep is fair     Positive cologuard- scheduled for colonoscopy      - no major changes in bowels    Thumb pain right side, flares up with OA, using voltaren gel for now       Review Of Systems:  GEN- denies fatigue, fever, weight loss,weakness, recent illness HEENT- denies eye drainage, change in vision, nasal discharge, CVS- denies chest pain, palpitations RESP- denies SOB, cough, wheeze ABD- denies N/V, change in stools, abd pain GU- denies dysuria, hematuria, dribbling, incontinence MSK- + joint pain, muscle aches, injury Neuro- denies headache, dizziness, syncope, seizure activity       Objective:    BP (!) 142/80   Pulse (!) 110   Temp 98.4 F (36.9 C) (Oral)   Resp 14   Ht 5\' 7"  (1.702 m)   Wt 221 lb (100.2 kg)   LMP 03/01/2017   SpO2 97%   BMI 34.61 kg/m  GEN- NAD, alert and oriented x3 HEENT- PERRL, EOMI, non injected sclera, pink conjunctiva, MMM, oropharynx clear Neck- Supple, no thyromegaly CVS- tacycardic HR 100, no murmur RESP-CTAB Psych- anxious appearing more than normal, not depressed, no SI, well groomed EXT- No edema Pulses- Radial 2+        Assessment & Plan:      Problem List Items Addressed This Visit      Unprioritized   Essential hypertension, benign - Primary    Checks bp at home has been okay Stressed and highly anxious past few weeks Treat anxiety, continue to trend bp       GAD (generalized anxiety disorder)   Add buspar 7.5mg  BID F/u via phone ormychart see how she is doing       Relevant Medications   busPIRone (BUSPAR) 7.5 MG tablet   ALPRAZolam (XANAX) 1 MG tablet   OA (osteoarthritis)   Relevant Medications   HYDROcodone-acetaminophen (NORCO/VICODIN) 5-325 MG tablet   cyclobenzaprine (FLEXERIL) 10 MG tablet   Obesity      Note: This dictation was prepared with Dragon dictation along with smaller phrase technology. Any transcriptional errors that result from this process are unintentional.

## 2018-08-19 NOTE — Telephone Encounter (Signed)
Ok to refill?  Last refills 07/15/2018.  Last OV 08/19/2018.

## 2018-08-19 NOTE — Patient Instructions (Addendum)
F/u December for physical  Send me mychart message in 2-3 weeks see how Buspar is doing

## 2018-08-20 ENCOUNTER — Other Ambulatory Visit: Payer: Self-pay | Admitting: Family Medicine

## 2018-08-21 ENCOUNTER — Encounter: Payer: Self-pay | Admitting: Family Medicine

## 2018-08-21 NOTE — Assessment & Plan Note (Signed)
Checks bp at home has been okay Stressed and highly anxious past few weeks Treat anxiety, continue to trend bp

## 2018-08-21 NOTE — Assessment & Plan Note (Signed)
Add buspar 7.5mg  BID F/u via phone ormychart see how she is doing

## 2018-08-22 NOTE — Telephone Encounter (Signed)
Ok to refill??  Last office visit 08/19/2018.  Last refill 07/15/2018.

## 2018-09-06 ENCOUNTER — Encounter: Payer: Self-pay | Admitting: Family Medicine

## 2018-09-11 ENCOUNTER — Other Ambulatory Visit: Payer: Self-pay | Admitting: Family Medicine

## 2018-09-16 ENCOUNTER — Other Ambulatory Visit: Payer: Self-pay | Admitting: Family Medicine

## 2018-09-16 NOTE — Telephone Encounter (Signed)
Last office visit:08/19/2018 Last refill: 08/19/2018

## 2018-09-19 ENCOUNTER — Other Ambulatory Visit: Payer: Self-pay | Admitting: Family Medicine

## 2018-09-19 NOTE — Telephone Encounter (Signed)
Requested Prescriptions   Pending Prescriptions Disp Refills  . traMADol (ULTRAM) 50 MG tablet [Pharmacy Med Name: TRAMADOL HCL 50 MG TABLET] 60 tablet 0    Sig: TAKE 1 TABLET BY MOUTH 2 TIMES DAILY AS NEEDED. FOR PAIN     Last OV 08/19/2018  Last written 08/22/2018

## 2018-09-20 ENCOUNTER — Encounter: Payer: Self-pay | Admitting: Family Medicine

## 2018-09-20 DIAGNOSIS — G5600 Carpal tunnel syndrome, unspecified upper limb: Secondary | ICD-10-CM

## 2018-09-24 ENCOUNTER — Encounter: Payer: Self-pay | Admitting: Family Medicine

## 2018-09-30 ENCOUNTER — Other Ambulatory Visit: Payer: Self-pay | Admitting: Family Medicine

## 2018-09-30 MED ORDER — HYDROCODONE-ACETAMINOPHEN 5-325 MG PO TABS: 1.0000 | ORAL_TABLET | Freq: Four times a day (QID) | ORAL | 0 refills | Status: DC | PRN

## 2018-09-30 NOTE — Telephone Encounter (Signed)
Ok to refill??  Last office visit/ refill 08/19/2018.

## 2018-10-14 ENCOUNTER — Other Ambulatory Visit: Payer: Self-pay | Admitting: Family Medicine

## 2018-10-14 NOTE — Telephone Encounter (Signed)
Ok to refill 

## 2018-10-19 ENCOUNTER — Other Ambulatory Visit: Payer: Self-pay | Admitting: Family Medicine

## 2018-10-19 NOTE — Telephone Encounter (Signed)
Ok to refill??  Last office visit 08/19/2018.  Last refill 09/19/2018.

## 2018-10-24 ENCOUNTER — Encounter: Payer: Self-pay | Admitting: Family Medicine

## 2018-11-04 ENCOUNTER — Other Ambulatory Visit: Payer: Self-pay | Admitting: Family Medicine

## 2018-11-04 MED ORDER — HYDROCODONE-ACETAMINOPHEN 5-325 MG PO TABS: 1.0000 | ORAL_TABLET | Freq: Four times a day (QID) | ORAL | 0 refills | Status: DC | PRN

## 2018-11-04 NOTE — Telephone Encounter (Signed)
Requested Prescriptions   Pending Prescriptions Disp Refills  . HYDROcodone-acetaminophen (NORCO/VICODIN) 5-325 MG tablet 30 tablet 0    Sig: Take 1 tablet by mouth every 6 (six) hours as needed.    Last OV 08/19/2018  Last written 09/30/2018

## 2018-11-11 ENCOUNTER — Ambulatory Visit (INDEPENDENT_AMBULATORY_CARE_PROVIDER_SITE_OTHER): Payer: Managed Care, Other (non HMO)

## 2018-11-11 ENCOUNTER — Other Ambulatory Visit: Payer: Self-pay

## 2018-11-11 DIAGNOSIS — Z23 Encounter for immunization: Secondary | ICD-10-CM

## 2018-11-11 NOTE — Progress Notes (Signed)
Patient came in to receive her annual flu shot. Patient received fluarix in her left deltoid. Patient tolerated well. VIS given.

## 2018-11-13 ENCOUNTER — Other Ambulatory Visit: Payer: Self-pay | Admitting: Family Medicine

## 2018-11-14 ENCOUNTER — Other Ambulatory Visit: Payer: Self-pay | Admitting: Family Medicine

## 2018-11-14 NOTE — Telephone Encounter (Signed)
Ok to refill 

## 2018-11-19 ENCOUNTER — Other Ambulatory Visit: Payer: Self-pay | Admitting: Family Medicine

## 2018-11-21 ENCOUNTER — Encounter: Payer: Self-pay | Admitting: Family Medicine

## 2018-11-21 ENCOUNTER — Other Ambulatory Visit: Payer: Self-pay

## 2018-11-21 NOTE — Telephone Encounter (Signed)
Ok to refill??  Last office visit 08/19/2018.  Last refill 10/19/2018.

## 2018-11-22 ENCOUNTER — Ambulatory Visit (INDEPENDENT_AMBULATORY_CARE_PROVIDER_SITE_OTHER): Payer: Managed Care, Other (non HMO) | Admitting: Family Medicine

## 2018-11-22 ENCOUNTER — Encounter: Payer: Self-pay | Admitting: Family Medicine

## 2018-11-22 VITALS — BP 138/74 | HR 92 | Temp 98.0°F | Resp 14 | Ht 67.0 in | Wt 224.0 lb

## 2018-11-22 DIAGNOSIS — H00034 Abscess of left upper eyelid: Secondary | ICD-10-CM | POA: Diagnosis not present

## 2018-11-22 DIAGNOSIS — H1032 Unspecified acute conjunctivitis, left eye: Secondary | ICD-10-CM

## 2018-11-22 DIAGNOSIS — J302 Other seasonal allergic rhinitis: Secondary | ICD-10-CM

## 2018-11-22 MED ORDER — AMOXICILLIN-POT CLAVULANATE 875-125 MG PO TABS: 1.0000 | ORAL_TABLET | Freq: Two times a day (BID) | ORAL | 0 refills | Status: DC

## 2018-11-22 MED ORDER — POLYMYXIN B-TRIMETHOPRIM 10000-0.1 UNIT/ML-% OP SOLN: 1.0000 [drp] | Freq: Four times a day (QID) | OPHTHALMIC | 0 refills | Status: DC

## 2018-11-22 NOTE — Progress Notes (Signed)
   Subjective:    Patient ID: Lori Mullins, female    DOB: 1965-11-26, 53 y.o.   MRN: [id number]  Patient presents for Eye Injury (glasses hit eye and eye orbit wile playing with pets) Patient here with left eye pain and swelling.  Saturday morning her parents were jumping on the bed her glasses hit her eye she is not sure she was scratched as well.  She initially thought it was going to be a black eye but then noticed the redness and swelling.  She is also noted some heat to the area.  This morning she had some drainage from the eye with a little crusting.  She went to work today and failed a temperature screening but they do it over the forehead which is where she had the swelling and redness.  She does not have any cough or congestion.  She has had some seasonal allergies but she has been taking Zyrtec for this.  There is no blurry vision no headache no dizziness associated.    Review Of Systems:  GEN- denies fatigue, fever, weight loss,weakness, recent illness HEENT- + eye drainage, change in vision, nasal discharge, CVS- denies chest pain, palpitations RESP- denies SOB, cough, wheeze ABD- denies N/V, change in stools, abd pain GU- denies dysuria, hematuria, dribbling, incontinence MSK- denies joint pain, muscle aches, injury Neuro- denies headache, dizziness, syncope, seizure activity       Objective:    BP 138/74   Pulse 92   Temp 98 F (36.7 C) (Oral)   Resp 14   Ht 5\' 7"  (1.702 m)   Wt 224 lb (101.6 kg)   LMP 03/01/2017   SpO2 99%   BMI 35.08 kg/m  GEN- NAD, alert and oriented x3 HEENT- PERRL, EOMI, non injected sclera, right pink conjunctiva, MMM, oropharynx clear, Left upper lid with swelling and erythema+heat, TTP, mild injected conjunctiva  Left eye ,vision grossly int act , TM clear no effusion, nares clear, mild sinus tenderness  Neck- Supple, shotty submandibular LAD CVS- RRR, no murmur RESP-CTAB        Assessment & Plan:      Problem List Items  Addressed This Visit    None    Visit Diagnoses    Cellulitis of left upper eyelid    -  Primary   Treat with augmentin, possible scratch from dogs but has preseptal lid cellulitis, givenpolytrim due to possible conjunctivit, if this worsens pt to call, continue warm compresses as well.  She is not febrile here in the office.  However she does work in an office like setting so we can give her a couple days out of work to get the eye infection under control.  She does not wear contacts.  She will call for some reason she does develop other respiratory symptoms but at this time do not think that this is COVID related.  To new allergy medication if she does have early sinusitis Augmentin will cover this.   Acute conjunctivitis of left eye, unspecified acute conjunctivitis type       Seasonal allergies          Note: This dictation was prepared with Dragon dictation along with smaller phrase technology. Any transcriptional errors that result from this process are unintentional.

## 2018-11-22 NOTE — Patient Instructions (Signed)
Call if not improved Use eye drop Warm compresses and oral antibiotics F/U as needed

## 2018-11-23 ENCOUNTER — Encounter: Payer: Self-pay | Admitting: Family Medicine

## 2018-11-23 NOTE — Telephone Encounter (Signed)
Note changed in my chart

## 2018-11-29 ENCOUNTER — Encounter: Payer: Self-pay | Admitting: Neurology

## 2018-11-29 ENCOUNTER — Ambulatory Visit (INDEPENDENT_AMBULATORY_CARE_PROVIDER_SITE_OTHER): Payer: Managed Care, Other (non HMO) | Admitting: Neurology

## 2018-11-29 ENCOUNTER — Ambulatory Visit: Payer: Managed Care, Other (non HMO) | Admitting: Neurology

## 2018-11-29 ENCOUNTER — Other Ambulatory Visit: Payer: Self-pay

## 2018-11-29 DIAGNOSIS — G5603 Carpal tunnel syndrome, bilateral upper limbs: Secondary | ICD-10-CM | POA: Diagnosis not present

## 2018-11-29 HISTORY — DX: Carpal tunnel syndrome, bilateral upper limbs: G56.03

## 2018-11-29 NOTE — Progress Notes (Signed)
Please refer to EMG and nerve conduction procedure note.  

## 2018-11-29 NOTE — Procedures (Signed)
     HISTORY:  Lori Mullins is a 53 year old patient with a history of hand numbness that dates back approximately 1 year.  She has been wearing a wrist splint without full benefit, she denies any significant neck pain or pain down the arms on either side.  She is being evaluated for possible neuropathy or a cervical radiculopathy.  NERVE CONDUCTION STUDIES:  Nerve conduction studies were performed on both upper extremities.  The distal motor latencies for the median nerves were prolonged bilaterally with a normal motor amplitude on the right and a low motor amplitude on the left.  Nerve conduction velocities for the median nerves were normal bilaterally.  The distal motor latencies and motor amplitudes and nerve conduction velocities for the ulnar nerves were normal bilaterally.  The sensory latencies for the median nerves were unobtainable on the left and prolonged on the right.  The ulnar sensory latencies were normal bilaterally.  The F-wave latencies for the ulnar nerves were within normal limits bilaterally.  EMG STUDIES:  EMG study was performed on the left upper extremity:  The first dorsal interosseous muscle reveals 2 to 4 K units with full recruitment. No fibrillations or positive waves were noted. The abductor pollicis brevis muscle reveals 2 to 4 K units with slightly reduced recruitment. No fibrillations or positive waves were noted. The extensor indicis proprius muscle reveals 1 to 3 K units with full recruitment. No fibrillations or positive waves were noted. The pronator teres muscle reveals 2 to 3 K units with full recruitment. No fibrillations or positive waves were noted. The biceps muscle reveals 1 to 2 K units with full recruitment. No fibrillations or positive waves were noted. The triceps muscle reveals 2 to 4 K units with full recruitment. No fibrillations or positive waves were noted. The anterior deltoid muscle reveals 2 to 3 K units with full recruitment. No  fibrillations or positive waves were noted. The cervical paraspinal muscles were tested at 2 levels. No abnormalities of insertional activity were seen at either level tested. There was poor relaxation.   IMPRESSION:  Nerve conduction studies done on both upper extremities show evidence of bilateral carpal tunnel syndrome of mild severity on the right and moderate severity on the left.  EMG evaluation of the left upper extremity did not show evidence of an overlying cervical radiculopathy.  Jill Alexanders MD 11/29/2018 4:43 PM  [facility] [address] [place], [place] [address]  Phone [phone] Fax [phone]

## 2018-11-30 NOTE — Progress Notes (Addendum)
Call pt, Nerve conduction showed mild carpal tunnel in right hand and moderate in left TYpically use of braces would help  Since she has been using braces already, I would send for injections with specialist and they will also discuss carpal tunnel surgery as the left hand may qualify  If pt agrees,  Referral to hand surgery- Dx Carpal tunnel syndrome, bilateral      MNC    Nerve / Sites Muscle Latency Ref. Amplitude Ref. Rel Amp Segments Distance Velocity Ref. Area    ms ms mV mV %  cm m/s m/s mVms  R Median - APB     Wrist APB 6.8 ?4.4 6.3 ?4.0 100 Wrist - APB 7   29.4     Upper arm APB 10.7  6.1  95.9 Upper arm - Wrist 21 53 ?49 30.2  L Median - APB     Wrist APB 10.1 ?4.4 3.0 ?4.0 100 Wrist - APB 7   13.9     Upper arm APB 14.1  2.9  96.1 Upper arm - Wrist 20 50 ?49 11.4  R Ulnar - ADM     Wrist ADM 2.7 ?3.3 10.0 ?6.0 100 Wrist - ADM 7   23.7     B.Elbow ADM 5.6  8.9  89.4 B.Elbow - Wrist 19 65 ?49 22.4     A.Elbow ADM 7.1  8.3  93 A.Elbow - B.Elbow 10 64 ?49 21.0         A.Elbow - Wrist      L Ulnar - ADM     Wrist ADM 2.9 ?3.3 8.4 ?6.0 100 Wrist - ADM 7   24.9     B.Elbow ADM 5.9  8.3  97.9 B.Elbow - Wrist 19 64 ?49 24.0     A.Elbow ADM 7.7  8.1  98 A.Elbow - B.Elbow 10 55 ?49 23.5         A.Elbow - Wrist                 SNC    Nerve / Sites Rec. Site Peak Lat Ref.  Amp Ref. Segments Distance    ms ms V V  cm  R Median - Orthodromic (Dig II, Mid palm)     Dig II Wrist 4.5 ?3.4 4 ?10 Dig II - Wrist 13  L Median - Orthodromic (Dig II, Mid palm)     Dig II Wrist NR ?3.4 NR ?10 Dig II - Wrist 13  R Ulnar - Orthodromic, (Dig V, Mid palm)     Dig V Wrist 2.4 ?3.1 8 ?5 Dig V - Wrist 11  L Ulnar - Orthodromic, (Dig V, Mid palm)     Dig V Wrist 2.7 ?3.1 8 ?5 Dig V - Wrist 14             F  Wave    Nerve F Lat Ref.   ms ms  R Ulnar - ADM 26.2 ?32.0  L Ulnar - ADM 26.3 ?32.0

## 2018-12-01 ENCOUNTER — Other Ambulatory Visit: Payer: Self-pay | Admitting: *Deleted

## 2018-12-01 DIAGNOSIS — G5603 Carpal tunnel syndrome, bilateral upper limbs: Secondary | ICD-10-CM

## 2018-12-01 NOTE — Progress Notes (Signed)
Call placed to patient and patient made aware.  ° °Agreeable to referral.  °

## 2018-12-08 ENCOUNTER — Other Ambulatory Visit: Payer: Self-pay | Admitting: *Deleted

## 2018-12-08 ENCOUNTER — Ambulatory Visit: Payer: Managed Care, Other (non HMO) | Admitting: Nurse Practitioner

## 2018-12-08 ENCOUNTER — Encounter: Payer: Self-pay | Admitting: *Deleted

## 2018-12-08 ENCOUNTER — Other Ambulatory Visit: Payer: Self-pay

## 2018-12-08 ENCOUNTER — Encounter: Payer: Self-pay | Admitting: Nurse Practitioner

## 2018-12-08 VITALS — BP 166/93 | HR 93 | Temp 98.2°F | Ht 67.0 in | Wt 217.6 lb

## 2018-12-08 DIAGNOSIS — R195 Other fecal abnormalities: Secondary | ICD-10-CM

## 2018-12-08 MED ORDER — PEG 3350-KCL-NA BICARB-NACL 420 G PO SOLR: 4000.0000 mL | Freq: Once | ORAL | 0 refills | Status: AC

## 2018-12-08 NOTE — Progress Notes (Signed)
Referring Provider: [name], MD Primary Care Physician:  [name], MD Primary GI:  Dr. [name]  Chief Complaint  Patient presents with  . +Cologuard    HPI:   Lori Mullins is a 53 y.o. female who presents for follow-up.  The patient was last seen in our office 06/08/2018 for positive colorectal cancer screening using Cologuard.  The patient's last visit was a virtual office visit due to COVID-19/coronavirus pandemic.  Her last visit was a referral from primary care.  General medical exam on 02/18/2018 with colorectal cancer screening with Cologuard test that was positive on 03/13/2018.  No history of colonoscopy in our system.  At the time she noted she had a hemorrhoid and felt this was likely the cause.  No overt GI symptoms.  Colonoscopy at a future date, did not want to schedule that day.  Follow-up in 6 months for colonoscopy scheduling.  Today she states she's doing ok overall. Denies abdominal pain, N/V, hematochezia, melena, fever, chills, unintentional weight loss. Denies URI or flu-like symptoms. Denies loss of sense of taste or smell. Denies chest pain, dyspnea, dizziness, lightheadedness, syncope, near syncope. Denies any other upper or lower GI symptoms.  Past Medical History:  Diagnosis Date  . Anxiety   . Arthritis    Hip/Hands  . Bilateral carpal tunnel syndrome 11/29/2018  . Campylobacter intestinal infection 1996   Became systemic  . Cellulitis    occular  . Hypertension   . OCD (obsessive compulsive disorder) 2007  . Plantar fasciitis    seen by orthoStevie Kern    Past Surgical History:  Procedure Laterality Date  . CERVICAL DISCECTOMY    . RETINAL DETACHMENT SURGERY  2005    Current Outpatient Medications  Medication Sig Dispense Refill  . ALPRAZolam (XANAX) 1 MG tablet TAKE 1 TABLET BY MOUTH TWICE A DAY AS NEEDED 60 tablet 2  . busPIRone (BUSPAR) 7.5 MG tablet TAKE 1 TABLET BY MOUTH TWICE A DAY 180 tablet 2  . cyclobenzaprine  (FLEXERIL) 10 MG tablet TAKE 1 TABLET BY MOUTH THREE TIMES A DAY AS NEEDED FOR MUSCLE SPASMS 45 tablet 1  . diclofenac sodium (VOLTAREN) 1 % GEL APPLY 2 GRAMS TO AFFECTED AREA 4 TIMES A DAY AS DIRECTED (Patient taking differently: as needed. APPLY 2 GRAMS TO AFFECTED AREA 4 TIMES A DAY AS DIRECTED) 100 g 2  . HYDROcodone-acetaminophen (NORCO/VICODIN) 5-325 MG tablet Take 1 tablet by mouth every 6 (six) hours as needed. 30 tablet 0  . loratadine (CLARITIN) 10 MG tablet Take 10 mg by mouth daily.    [name] losartan (COZAAR) 100 MG tablet TAKE 1/2 TABLET BY MOUTH EVERY DAY 45 tablet 1  . Multiple Vitamins-Minerals (MULTIVITAMIN PO) Take by mouth daily.     [name] OVER THE COUNTER MEDICATION Full spectrum CBD Supplement    . traMADol (ULTRAM) 50 MG tablet TAKE 1 TABLET BY MOUTH 2 TIMES DAILY AS NEEDED. FOR PAIN 60 tablet 0   No current facility-administered medications for this visit.     Allergies as of 12/08/2018 - Review Complete 12/08/2018  Allergen Reaction Noted  . 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 Hypertension Father   . Colon cancer Neg Hx   . Colon polyps Neg Hx     Social History   Socioeconomic History  . Marital status: Married    Spouse name: Not on file  . Number of children: Not on file  .  Years of education: Not on file  . Highest education level: Not on file  Occupational History  . Not on file  Social Needs  . Financial resource strain: Not on file  . Food insecurity    Worry: Not on file    Inability: Not on file  . Transportation needs    Medical: Not on file    Non-medical: Not on file  Tobacco Use  . Smoking status: Former Research scientist (life sciences)  . Smokeless tobacco: Never Used  Substance and Sexual Activity  . Alcohol use: Yes    Alcohol/week: 0.0 standard drinks    Comment: occasionally  . Drug use: Yes    Types: Marijuana  . Sexual activity: Yes  Lifestyle  . Physical activity    Days per week: Not on  file    Minutes per session: Not on file  . Stress: Not on file  Relationships  . Social Herbalist on phone: Not on file    Gets together: Not on file    Attends religious service: Not on file    Active member of club or organization: Not on file    Attends meetings of clubs or organizations: Not on file    Relationship status: Not on file  Other Topics Concern  . Not on file  Social History Narrative  . Not on file    Review of Systems: General: Negative for anorexia, weight loss, fever, chills, fatigue, weakness. ENT: Negative for hoarseness, difficulty swallowing. CV: Negative for chest pain, angina, palpitations, peripheral edema.  Respiratory: Negative for dyspnea at rest, cough, sputum, wheezing.  GI: See history of present illness. Endo: Negative for unusual weight change.  Heme: Negative for bruising or bleeding. Allergy: Negative for rash or hives.   Physical Exam: BP (!) 166/93   Pulse 93   Temp 98.2 F (36.8 C) (Temporal)   Ht 5\' 7"  (1.702 m)   Wt 217 lb 9.6 oz (98.7 kg)   LMP 03/01/2017   BMI 34.08 kg/m  General:   Alert and oriented. Pleasant and cooperative. Well-nourished and well-developed.  Eyes:  Without icterus, sclera clear and conjunctiva pink.  Ears:  Normal auditory acuity. Cardiovascular:  S1, S2 present without murmurs appreciated. Extremities without clubbing or edema. Respiratory:  Clear to auscultation bilaterally. No wheezes, rales, or rhonchi. No distress.  Gastrointestinal:  +BS, soft, non-tender and non-distended. No HSM noted. No guarding or rebound. No masses appreciated.  Rectal:  Deferred  Musculoskalatal:  Symmetrical without gross deformities. Skin:  Intact without significant lesions or rashes. Neurologic:  Alert and oriented x4;  grossly normal neurologically. Psych:  Alert and cooperative. Normal mood and affect. Heme/Lymph/Immune: No excessive bruising noted.    12/08/2018 9:47 AM   Disclaimer: This note was  dictated with voice recognition software. Similar sounding words can inadvertently be transcribed and may not be corrected upon review.

## 2018-12-08 NOTE — Assessment & Plan Note (Addendum)
The patient previously tested positive for Cologuard.  She elected to defer colonoscopy at that time.  Today she follows up for scheduling of a colonoscopy.  She agrees to pursue as "I guess it is required."  Denies any changes in her GI health.  No hematochezia, abdominal pain.  We will proceed with colonoscopy at this time.  Proceed with TCS on propofol/MAC with Dr. [name] in near future: the risks, benefits, and alternatives have been discussed with the patient in detail. The patient states understanding and desires to proceed.  The patient is currently on Xanax, BuSpar, Flexeril, Norco, Ultram.  Recreational marijuana use but no other recreational drugs.  Occasional/social alcohol.  No other anticoagulants, anxiolytics, chronic pain medications, antidepressants, antidiabetics, or iron supplements.  We will plan for the procedure on propofol/MAC to promote adequate sedation.

## 2018-12-08 NOTE — Progress Notes (Signed)
CC'ED TO PCP 

## 2018-12-08 NOTE — Patient Instructions (Signed)
Your health issues we discussed today were:   Need for colonoscopy due to positive Cologuard: 1. As discussed, we will schedule your procedure for you 2. Further recommendations will be made after your procedure 3. Call us if you have any new overt GI symptoms such as rectal bleeding  Overall I recommend:  1. Continue your other current medications 2. Call us if you have any questions or concerns 3. Follow-up based on recommendations made after your colonoscopy.   Because of recent events of COVID-19 ("Coronavirus"), follow CDC recommendations:  1. Wash your hand frequently 2. Avoid touching your face 3. Stay away from people who are sick 4. If you have symptoms such as fever, cough, shortness of breath then call your healthcare provider for further guidance 5. If you are sick, STAY AT HOME unless otherwise directed by your healthcare provider. 6. Follow directions from state and national officials regarding staying safe   At [facility] we value your feedback. You may receive a survey about your visit today. Please share your experience as we strive to create trusting relationships with our patients to provide genuine, compassionate, quality care.  We appreciate your understanding and patience as we review any laboratory studies, imaging, and other diagnostic tests that are ordered as we care for you. Our office policy is 5 business days for review of these results, and any emergent or urgent results are addressed in a timely manner for your best interest. If you do not hear from our office in 1 week, please contact us.   We also encourage the use of MyChart, which contains your medical information for your review as well. If you are not enrolled in this feature, an access code is on this after visit summary for your convenience. Thank you for allowing [place] to be involved in your care.  It was great to see you today!  I hope you have a great Fall!!

## 2018-12-09 ENCOUNTER — Other Ambulatory Visit: Payer: Self-pay | Admitting: Family Medicine

## 2018-12-09 ENCOUNTER — Ambulatory Visit: Payer: Managed Care, Other (non HMO) | Admitting: Family Medicine

## 2018-12-09 ENCOUNTER — Encounter: Payer: Self-pay | Admitting: Family Medicine

## 2018-12-09 NOTE — Telephone Encounter (Signed)
Patient reports that she usually sees Dr Radford Pax in [place].   Call placed to [facility] and they agreed to work her in urgently.   Call placed to patient and patient made aware. Will head over to office now.

## 2018-12-09 NOTE — Telephone Encounter (Signed)
-----   Message from [name], MD sent at 12/09/2018  9:24 AM EDT ----- Regarding: APPOINTMENT TODAY     Please see  if Lori Mullins has an eye doctor we can get her an urgent appt with , if she has recurrent pain and redness, she was treated for cellulitis, when the dog jumped on her a little over 2 weeks ago, if redness and pain in the eye has returned if would best if they can see her make sure we are not missing anything if pain is in the eye- she may have a corneal abrasion

## 2018-12-12 ENCOUNTER — Encounter: Payer: Self-pay | Admitting: *Deleted

## 2018-12-19 ENCOUNTER — Other Ambulatory Visit: Payer: Self-pay | Admitting: Family Medicine

## 2018-12-19 MED ORDER — HYDROCODONE-ACETAMINOPHEN 5-325 MG PO TABS: 1.0000 | ORAL_TABLET | Freq: Four times a day (QID) | ORAL | 0 refills | Status: DC | PRN

## 2018-12-19 NOTE — Telephone Encounter (Signed)
Ok to refill??  Last office visit 11/22/2018.  Last refill on Xanax 09/16/2018.  Last refill on Tramadol 11/21/2018.

## 2018-12-19 NOTE — Telephone Encounter (Signed)
Ok to refill??  Last office visit 11/22/2018.  Last refill 11/04/2018.

## 2018-12-20 ENCOUNTER — Other Ambulatory Visit: Payer: Self-pay | Admitting: Family Medicine

## 2018-12-21 ENCOUNTER — Encounter: Payer: Self-pay | Admitting: Family Medicine

## 2018-12-21 ENCOUNTER — Other Ambulatory Visit: Payer: Self-pay | Admitting: Family Medicine

## 2018-12-21 NOTE — Telephone Encounter (Signed)
Requested Prescriptions   Pending Prescriptions Disp Refills  . traMADol (ULTRAM) 50 MG tablet [Pharmacy Med Name: TRAMADOL HCL 50 MG TABLET] 60 tablet 0    Sig: TAKE 1 TABLET BY MOUTH 2 TIMES DAILY AS NEEDED. FOR PAIN    Last OV 11/22/2018  Last written 11/21/2018

## 2018-12-27 ENCOUNTER — Other Ambulatory Visit: Payer: Self-pay | Admitting: Family Medicine

## 2018-12-27 MED ORDER — TRAMADOL HCL 50 MG PO TABS: 50.0000 mg | ORAL_TABLET | Freq: Two times a day (BID) | ORAL | 0 refills | Status: DC | PRN

## 2018-12-27 NOTE — Telephone Encounter (Signed)
Ok to refill??  Last office visit 11/22/2018.  Last refill 11/21/2018.  Of note, Hydrocodone filled on 12/19/2018.

## 2019-01-07 ENCOUNTER — Other Ambulatory Visit: Payer: Self-pay | Admitting: Family Medicine

## 2019-01-09 NOTE — Telephone Encounter (Signed)
Last seen: 11/22/2018 Last refilled: 12/09/2018

## 2019-01-20 ENCOUNTER — Encounter: Payer: Self-pay | Admitting: Family Medicine

## 2019-01-23 ENCOUNTER — Other Ambulatory Visit: Payer: Self-pay | Admitting: Family Medicine

## 2019-01-23 NOTE — Telephone Encounter (Signed)
Ok to refill??  Last office visit 11/22/2018.  Last refill 12/27/2018.

## 2019-01-25 ENCOUNTER — Other Ambulatory Visit: Payer: Self-pay | Admitting: Family Medicine

## 2019-01-25 NOTE — Telephone Encounter (Signed)
Ok to refill 

## 2019-01-30 ENCOUNTER — Other Ambulatory Visit: Payer: Self-pay | Admitting: Family Medicine

## 2019-01-30 MED ORDER — HYDROCODONE-ACETAMINOPHEN 5-325 MG PO TABS: 1.0000 | ORAL_TABLET | Freq: Four times a day (QID) | ORAL | 0 refills | Status: DC | PRN

## 2019-01-30 NOTE — Telephone Encounter (Signed)
Ok to refill??  Last office visit 11/22/2018.  Last refill 12/19/2018.

## 2019-02-06 ENCOUNTER — Telehealth: Payer: Self-pay | Admitting: Internal Medicine

## 2019-02-06 NOTE — Telephone Encounter (Signed)
[facility] SHE WAS TOLD THE ONE THAT WAS CALLED IN PRIOR IS ON BACK ORDER

## 2019-02-06 NOTE — Telephone Encounter (Signed)
Called pt, informed her she can do Miralax prep. New instructions posted in [facility] and mailed.

## 2019-02-08 ENCOUNTER — Telehealth: Payer: Self-pay | Admitting: Internal Medicine

## 2019-02-08 ENCOUNTER — Encounter: Payer: Self-pay | Admitting: Family Medicine

## 2019-02-08 NOTE — Telephone Encounter (Signed)
Called pt. She requested to r/s her TCS with [facility]. She stated "i'm not paying my deductible in December". I advised patient she can make payments but still did not want too. She has rescheduled to 3/15 at 7:30am. Patient aware will mail new instructions with new pre-op/covid testing. She stated she may have this done somewhere else. I advised if she did to let us know so we can cancel. She voiced understanding. Endo aware of appt change.

## 2019-02-08 NOTE — Telephone Encounter (Signed)
See prior note

## 2019-02-08 NOTE — Telephone Encounter (Signed)
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

## 2019-02-08 NOTE — Telephone Encounter (Signed)
Pt wants to reschedule her procedure. She is scheduled with [facility] on 02/13/2019. [phone]

## 2019-02-10 ENCOUNTER — Other Ambulatory Visit (HOSPITAL_COMMUNITY): Payer: Managed Care, Other (non HMO)

## 2019-02-10 ENCOUNTER — Encounter (HOSPITAL_COMMUNITY): Admission: RE | Admit: 2019-02-10 | Payer: Managed Care, Other (non HMO) | Source: Ambulatory Visit

## 2019-02-13 ENCOUNTER — Other Ambulatory Visit: Payer: Self-pay | Admitting: *Deleted

## 2019-02-13 ENCOUNTER — Encounter: Payer: Self-pay | Admitting: Family Medicine

## 2019-02-13 DIAGNOSIS — Z13220 Encounter for screening for lipoid disorders: Secondary | ICD-10-CM

## 2019-02-13 DIAGNOSIS — I10 Essential (primary) hypertension: Secondary | ICD-10-CM

## 2019-02-17 ENCOUNTER — Other Ambulatory Visit: Payer: Managed Care, Other (non HMO)

## 2019-02-17 ENCOUNTER — Other Ambulatory Visit: Payer: Self-pay

## 2019-02-17 DIAGNOSIS — I10 Essential (primary) hypertension: Secondary | ICD-10-CM

## 2019-02-17 DIAGNOSIS — Z13220 Encounter for screening for lipoid disorders: Secondary | ICD-10-CM

## 2019-02-20 ENCOUNTER — Encounter: Payer: Managed Care, Other (non HMO) | Admitting: Family Medicine

## 2019-02-21 LAB — CBC WITH DIFFERENTIAL/PLATELET
Absolute Monocytes: 562 cells/uL (ref 200–950)
Basophils Absolute: 47 cells/uL (ref 0–200)
Basophils Relative: 0.6 %
Eosinophils Absolute: 304 cells/uL (ref 15–500)
Eosinophils Relative: 3.9 %
HCT: 44.7 % (ref 35.0–45.0)
Hemoglobin: 15.4 g/dL (ref 11.7–15.5)
Lymphs Abs: 1919 cells/uL (ref 850–3900)
MCH: 31.3 pg (ref 27.0–33.0)
MCHC: 34.5 g/dL (ref 32.0–36.0)
MCV: 90.9 fL (ref 80.0–100.0)
MPV: 10.5 fL (ref 7.5–12.5)
Monocytes Relative: 7.2 %
Neutro Abs: 4969 cells/uL (ref 1500–7800)
Neutrophils Relative %: 63.7 %
Platelets: 273 10*3/uL (ref 140–400)
RBC: 4.92 10*6/uL (ref 3.80–5.10)
RDW: 12 % (ref 11.0–15.0)
Total Lymphocyte: 24.6 %
WBC: 7.8 10*3/uL (ref 3.8–10.8)

## 2019-02-21 LAB — COMPLETE METABOLIC PANEL WITH GFR
AG Ratio: 1.8 (calc) (ref 1.0–2.5)
ALT: 16 U/L (ref 6–29)
AST: 18 U/L (ref 10–35)
Albumin: 4.2 g/dL (ref 3.6–5.1)
Alkaline phosphatase (APISO): 105 U/L (ref 37–153)
BUN: 13 mg/dL (ref 7–25)
CO2: 25 mmol/L (ref 20–32)
Calcium: 9.8 mg/dL (ref 8.6–10.4)
Chloride: 104 mmol/L (ref 98–110)
Creat: 0.65 mg/dL (ref 0.50–1.05)
GFR, Est African American: 116 mL/min/{1.73_m2} (ref >=60)
GFR, Est Non African American: 100 mL/min/{1.73_m2} (ref >=60)
Globulin: 2.4 g/dL (calc) (ref 1.9–3.7)
Glucose, Bld: 111 mg/dL — ABNORMAL HIGH (ref 65–99)
Potassium: 4.7 mmol/L (ref 3.5–5.3)
Sodium: 141 mmol/L (ref 135–146)
Total Bilirubin: 0.5 mg/dL (ref 0.2–1.2)
Total Protein: 6.6 g/dL (ref 6.1–8.1)

## 2019-02-21 LAB — TEST AUTHORIZATION

## 2019-02-21 LAB — LIPID PANEL
Cholesterol: 161 mg/dL (ref <200)
HDL: 68 mg/dL (ref >=50)
LDL Cholesterol (Calc): 77 mg/dL (calc)
Non-HDL Cholesterol (Calc): 93 mg/dL (calc) (ref <130)
Total CHOL/HDL Ratio: 2.4 (calc) (ref <5.0)
Triglycerides: 76 mg/dL (ref <150)

## 2019-02-21 LAB — HEMOGLOBIN A1C W/OUT EAG: Hgb A1c MFr Bld: 5 % of total Hgb (ref <5.7)

## 2019-02-22 ENCOUNTER — Encounter: Payer: Self-pay | Admitting: Family Medicine

## 2019-02-22 ENCOUNTER — Other Ambulatory Visit: Payer: Managed Care, Other (non HMO)

## 2019-02-22 ENCOUNTER — Other Ambulatory Visit: Payer: Self-pay | Admitting: Family Medicine

## 2019-02-25 ENCOUNTER — Other Ambulatory Visit: Payer: Self-pay | Admitting: Family Medicine

## 2019-02-27 ENCOUNTER — Other Ambulatory Visit: Payer: Self-pay | Admitting: Family Medicine

## 2019-02-27 ENCOUNTER — Encounter: Payer: Managed Care, Other (non HMO) | Admitting: Family Medicine

## 2019-02-27 ENCOUNTER — Other Ambulatory Visit: Payer: Managed Care, Other (non HMO)

## 2019-02-27 NOTE — Telephone Encounter (Signed)
Ok to refill??  Last office visit 11/22/2018.  Last refill 01/23/2019.  Ok to add refills to prescription?

## 2019-02-27 NOTE — Telephone Encounter (Signed)
Ok to refill 

## 2019-03-07 ENCOUNTER — Other Ambulatory Visit: Payer: Self-pay | Admitting: Family Medicine

## 2019-03-07 MED ORDER — HYDROCODONE-ACETAMINOPHEN 5-325 MG PO TABS: 1.0000 | ORAL_TABLET | Freq: Four times a day (QID) | ORAL | 0 refills | Status: DC | PRN

## 2019-03-07 NOTE — Telephone Encounter (Signed)
Ok to refill??  Last office visit 11/22/2018.  Last refill 01/30/2019.

## 2019-03-20 ENCOUNTER — Other Ambulatory Visit: Payer: Self-pay | Admitting: Surgery

## 2019-03-21 ENCOUNTER — Encounter: Payer: Self-pay | Admitting: Surgery

## 2019-03-21 ENCOUNTER — Other Ambulatory Visit: Payer: Self-pay

## 2019-03-27 ENCOUNTER — Other Ambulatory Visit: Payer: Self-pay | Admitting: Family Medicine

## 2019-03-27 ENCOUNTER — Other Ambulatory Visit
Admission: RE | Admit: 2019-03-27 | Discharge: 2019-03-27 | Disposition: A | Discharge status: Home / Self Care | Payer: Managed Care, Other (non HMO) | Source: Ambulatory Visit | Attending: Surgery | Admitting: Surgery

## 2019-03-27 ENCOUNTER — Other Ambulatory Visit: Payer: Self-pay

## 2019-03-27 DIAGNOSIS — Z20822 Contact with and (suspected) exposure to covid-19: Secondary | ICD-10-CM | POA: Insufficient documentation

## 2019-03-27 DIAGNOSIS — Z01812 Encounter for preprocedural laboratory examination: Secondary | ICD-10-CM | POA: Insufficient documentation

## 2019-03-27 LAB — SARS CORONAVIRUS 2 (TAT 6-24 HRS): SARS Coronavirus 2: NEGATIVE

## 2019-03-27 NOTE — Telephone Encounter (Signed)
Last refill: 12/19/2018 Last office visit: 11/22/2018

## 2019-03-29 ENCOUNTER — Encounter
Admission: RE | Disposition: A | Discharge status: Home / Self Care | Payer: Self-pay | Source: Home / Self Care | Attending: Surgery

## 2019-03-29 ENCOUNTER — Encounter: Payer: Self-pay | Admitting: Surgery

## 2019-03-29 ENCOUNTER — Ambulatory Visit
Admission: RE | Admit: 2019-03-29 | Discharge: 2019-03-29 | Disposition: A | Discharge status: Home / Self Care | Payer: Managed Care, Other (non HMO) | Attending: Surgery | Admitting: Surgery

## 2019-03-29 ENCOUNTER — Ambulatory Visit: Payer: Managed Care, Other (non HMO) | Admitting: Anesthesiology

## 2019-03-29 ENCOUNTER — Other Ambulatory Visit: Payer: Self-pay

## 2019-03-29 DIAGNOSIS — Z6835 Body mass index (BMI) 35.0-35.9, adult: Secondary | ICD-10-CM | POA: Diagnosis not present

## 2019-03-29 DIAGNOSIS — G5602 Carpal tunnel syndrome, left upper limb: Secondary | ICD-10-CM | POA: Insufficient documentation

## 2019-03-29 DIAGNOSIS — E669 Obesity, unspecified: Secondary | ICD-10-CM | POA: Diagnosis not present

## 2019-03-29 DIAGNOSIS — Z87891 Personal history of nicotine dependence: Secondary | ICD-10-CM | POA: Diagnosis not present

## 2019-03-29 DIAGNOSIS — Z79899 Other long term (current) drug therapy: Secondary | ICD-10-CM | POA: Diagnosis not present

## 2019-03-29 DIAGNOSIS — I10 Essential (primary) hypertension: Secondary | ICD-10-CM | POA: Insufficient documentation

## 2019-03-29 DIAGNOSIS — F419 Anxiety disorder, unspecified: Secondary | ICD-10-CM | POA: Insufficient documentation

## 2019-03-29 DIAGNOSIS — M1990 Unspecified osteoarthritis, unspecified site: Secondary | ICD-10-CM | POA: Diagnosis not present

## 2019-03-29 HISTORY — PX: CARPAL TUNNEL RELEASE: SHX101

## 2019-03-29 SURGERY — RELEASE, CARPAL TUNNEL, ENDOSCOPIC
Anesthesia: Regional | Site: Wrist | Laterality: Left

## 2019-03-29 MED ORDER — HYDROCODONE-ACETAMINOPHEN 5-325 MG PO TABS: 1.0000 | ORAL_TABLET | Freq: Four times a day (QID) | ORAL | 0 refills | Status: DC | PRN

## 2019-03-29 MED ORDER — CHLORHEXIDINE GLUCONATE 4 % EX LIQD
60.0000 mL | Freq: Once | CUTANEOUS | Status: AC
  Administered 2019-03-29: 4 via TOPICAL

## 2019-03-29 MED ORDER — KETOROLAC TROMETHAMINE 30 MG/ML IJ SOLN: 15.0000 mg | Freq: Once | INTRAMUSCULAR | Status: DC | PRN

## 2019-03-29 MED ORDER — MIDAZOLAM HCL 5 MG/5ML IJ SOLN
INTRAMUSCULAR | Status: DC | PRN
  Administered 2019-03-29: 2 mg via INTRAVENOUS

## 2019-03-29 MED ORDER — OXYCODONE HCL 5 MG PO TABS: 5.0000 mg | ORAL_TABLET | Freq: Once | ORAL | Status: DC | PRN

## 2019-03-29 MED ORDER — CEFAZOLIN SODIUM-DEXTROSE 2-4 GM/100ML-% IV SOLN
2.0000 g | INTRAVENOUS | Status: AC
  Administered 2019-03-29: 2 g via INTRAVENOUS

## 2019-03-29 MED ORDER — LACTATED RINGERS IV SOLN
100.0000 mL/h | INTRAVENOUS | Status: DC
  Administered 2019-03-29: 100 mL/h via INTRAVENOUS

## 2019-03-29 MED ORDER — ONDANSETRON HCL 4 MG/2ML IJ SOLN: 4.0000 mg | Freq: Once | INTRAMUSCULAR | Status: DC | PRN

## 2019-03-29 MED ORDER — BUPIVACAINE HCL (PF) 0.5 % IJ SOLN
INTRAMUSCULAR | Status: DC | PRN
  Administered 2019-03-29: 10 mL

## 2019-03-29 MED ORDER — OXYCODONE HCL 5 MG/5ML PO SOLN: 5.0000 mg | Freq: Once | ORAL | Status: DC | PRN

## 2019-03-29 MED ORDER — FENTANYL CITRATE (PF) 100 MCG/2ML IJ SOLN: 25.0000 ug | INTRAMUSCULAR | Status: DC | PRN

## 2019-03-29 MED ORDER — FENTANYL CITRATE (PF) 100 MCG/2ML IJ SOLN
INTRAMUSCULAR | Status: DC | PRN
  Administered 2019-03-29: 100 ug via INTRAVENOUS

## 2019-03-29 MED ORDER — LIDOCAINE HCL (PF) 0.5 % IJ SOLN
INTRAMUSCULAR | Status: DC | PRN
  Administered 2019-03-29: 40 mL via INTRAVENOUS

## 2019-03-29 MED ORDER — PROPOFOL 500 MG/50ML IV EMUL
INTRAVENOUS | Status: DC | PRN
  Administered 2019-03-29: 100 ug/kg/min via INTRAVENOUS

## 2019-03-29 MED ORDER — ONDANSETRON HCL 4 MG/2ML IJ SOLN
INTRAMUSCULAR | Status: DC | PRN
  Administered 2019-03-29: 4 mg via INTRAVENOUS

## 2019-03-29 SURGICAL SUPPLY — 31 items
BANDAGE ELASTIC 2 LF NS (GAUZE/BANDAGES/DRESSINGS) ×3 IMPLANT
BNDG COHESIVE 4X5 TAN STRL (GAUZE/BANDAGES/DRESSINGS) ×3 IMPLANT
BNDG ESMARK 4X12 TAN STRL LF (GAUZE/BANDAGES/DRESSINGS) ×3 IMPLANT
CANISTER SUCT 1200ML W/VALVE (MISCELLANEOUS) ×3 IMPLANT
CHLORAPREP W/TINT 26 (MISCELLANEOUS) ×3 IMPLANT
CORD BIP STRL DISP 12FT (MISCELLANEOUS) ×3 IMPLANT
CUFF TOURNIQUET DUAL PORT 18X3 (MISCELLANEOUS) ×3 IMPLANT
DRAPE SURG 17X11 SM STRL (DRAPES) ×3 IMPLANT
DRSG EMULSION OIL 3X3 NADH (GAUZE/BANDAGES/DRESSINGS) ×3 IMPLANT
GAUZE SPONGE 4X4 12PLY STRL (GAUZE/BANDAGES/DRESSINGS) ×3 IMPLANT
GAUZE XEROFORM 1X8 LF (GAUZE/BANDAGES/DRESSINGS) ×3 IMPLANT
GLOVE BIO SURGEON STRL SZ8 (GLOVE) ×3 IMPLANT
GLOVE INDICATOR 8.0 STRL GRN (GLOVE) ×3 IMPLANT
GOWN STRL REUS W/ TWL LRG LVL3 (GOWN DISPOSABLE) ×1 IMPLANT
GOWN STRL REUS W/ TWL XL LVL3 (GOWN DISPOSABLE) ×1 IMPLANT
GOWN STRL REUS W/TWL LRG LVL3 (GOWN DISPOSABLE) ×2
GOWN STRL REUS W/TWL XL LVL3 (GOWN DISPOSABLE) ×2
KIT CARPAL TUNNEL (MISCELLANEOUS) ×2
KIT ESCP INSRT D SLOT CANN KN (MISCELLANEOUS) ×1 IMPLANT
KIT TURNOVER KIT A (KITS) ×3 IMPLANT
NS IRRIG 500ML POUR BTL (IV SOLUTION) ×3 IMPLANT
PACK EXTREMITY ARMC (MISCELLANEOUS) ×3 IMPLANT
SPLINT WRIST LG LT TX990309 (SOFTGOODS) IMPLANT
SPLINT WRIST LG RT TX900304 (SOFTGOODS) IMPLANT
SPLINT WRIST M LT TX990308 (SOFTGOODS) ×3 IMPLANT
SPLINT WRIST M RT TX990303 (SOFTGOODS) IMPLANT
STOCKINETTE IMPERVIOUS 9X36 MD (GAUZE/BANDAGES/DRESSINGS) ×3 IMPLANT
STRAP BODY AND KNEE 60X3 (MISCELLANEOUS) ×3 IMPLANT
SUT PROLENE 4 0 PS 2 18 (SUTURE) ×3 IMPLANT
SUT VIC AB 3-0 SH 27 (SUTURE)
SUT VIC AB 3-0 SH 27X BRD (SUTURE) IMPLANT

## 2019-03-29 NOTE — Anesthesia Preprocedure Evaluation (Signed)
Anesthesia Evaluation  Patient identified by MRN, date of birth, ID band Patient awake    Reviewed: Allergy & Precautions, NPO status , Patient's Chart, lab work & pertinent test results  History of Anesthesia Complications Negative for: history of anesthetic complications  Airway Mallampati: II  TM Distance: >3 FB Neck ROM: Full    Dental no notable dental hx.    Pulmonary former smoker,    Pulmonary exam normal breath sounds clear to auscultation       Cardiovascular hypertension, Normal cardiovascular exam Rhythm:Regular Rate:Normal     Neuro/Psych PSYCHIATRIC DISORDERS Anxiety  Neuromuscular disease    GI/Hepatic negative GI ROS, Neg liver ROS,   Endo/Other  negative endocrine ROS  Renal/GU negative Renal ROS  negative genitourinary   Musculoskeletal  (+) Arthritis ,   Abdominal (+) + obese,   Peds  Hematology negative hematology ROS (+)   Anesthesia Other Findings   Reproductive/Obstetrics negative OB ROS                             Anesthesia Physical Anesthesia Plan  ASA: III  Anesthesia Plan: Bier Block and Bier Block-LIDOCAINE ONLY   Post-op Pain Management:    Induction: Intravenous  PONV Risk Score and Plan: 2 and Ondansetron and TIVA  Airway Management Planned: Nasal Cannula  Additional Equipment:   Intra-op Plan:   Post-operative Plan:   Informed Consent: I have reviewed the patients History and Physical, chart, labs and discussed the procedure including the risks, benefits and alternatives for the proposed anesthesia with the patient or authorized representative who has indicated his/her understanding and acceptance.     Dental advisory given  Plan Discussed with: CRNA and Anesthesiologist  Anesthesia Plan Comments:         Anesthesia Quick Evaluation  Patient Active Problem List   Diagnosis Date Noted  . Bilateral carpal tunnel syndrome  11/29/2018  . Positive colorectal cancer screening using Cologuard test 06/08/2018  . Essential hypertension, benign 12/16/2011  . Obesity 12/16/2011  . OA (osteoarthritis) 12/16/2011  . GAD (generalized anxiety disorder) 12/16/2011    CBC Latest Ref Rng & Units 02/17/2019 02/18/2018 12/18/2016  WBC 3.8 - 10.8 Thousand/uL 7.8 8.2 9.3  Hemoglobin 11.7 - 15.5 g/dL 15.4 15.0 16.3(H)  Hematocrit 35.0 - 45.0 % 44.7 44.0 47.3(H)  Platelets 140 - 400 Thousand/uL 273 296 269   BMP Latest Ref Rng & Units 02/17/2019 02/18/2018 12/18/2016  Glucose 65 - 99 mg/dL 111(H) 87 95  BUN 7 - 25 mg/dL 13 17 12   Creatinine 0.50 - 1.05 mg/dL 0.65 0.77 0.71  BUN/Creat Ratio 6 - 22 (calc) NOT APPLICABLE NOT APPLICABLE NOT APPLICABLE  Sodium A999333 - 146 mmol/L 141 142 139  Potassium 3.5 - 5.3 mmol/L 4.7 4.5 4.2  Chloride 98 - 110 mmol/L 104 105 101  CO2 20 - 32 mmol/L 25 25 27   Calcium 8.6 - 10.4 mg/dL 9.8 10.1 10.1   Risks and benefits of anesthesia discussed at length, patient or surrogate demonstrates understanding. Appropriately NPO. Plan to proceed with anesthesia.  Champ Mungo, MD 03/29/19

## 2019-03-29 NOTE — Anesthesia Procedure Notes (Signed)
Anesthesia Regional Block: Bier block (IV Regional)   Pre-Anesthetic Checklist: ,, timeout performed, Correct Patient, Correct Site, Correct Laterality, Correct Procedure, Correct Position, site marked, Risks and benefits discussed,  Surgical consent,  Pre-op evaluation,  At surgeon's request and post-op pain management  Laterality: Left     Needles:  Injection technique: Single-shot      Needle Gauge: 20     Additional Needles:   Narrative:  Start time: 03/29/2019 10:15 AM End time: 03/29/2019 10:20 AM Injection made incrementally with aspirations every 5 mL.  Performed by: Personally   Additional Notes: Functioning IV was confirmed and monitors applied.  The patient tolerated the procedure well. Vitals signes recorded in RN notes.

## 2019-03-29 NOTE — Discharge Instructions (Addendum)
Orthopedic discharge instructions: Keep dressing dry and intact. Keep hand elevated above heart level. May shower after dressing removed on postop day 4 (Sunday). Cover sutures with Band-Aids after drying off. Apply ice to affected area frequently. Take ibuprofen 600-800 mg TID with meals for 7-10 days, then as necessary. Take ES Tylenol or pain medication as prescribed when needed.  Return for follow-up in 10-14 days or as scheduled.  General Anesthesia, Adult, Care After This sheet gives you information about how to care for yourself after your procedure. Your health care provider may also give you more specific instructions. If you have problems or questions, contact your health care provider. What can I expect after the procedure? After the procedure, the following side effects are common:  Pain or discomfort at the IV site.  Nausea.  Vomiting.  Sore throat.  Trouble concentrating.  Feeling cold or chills.  Weak or tired.  Sleepiness and fatigue.  Soreness and body aches. These side effects can affect parts of the body that were not involved in surgery. Follow these instructions at home:  For at least 24 hours after the procedure:  Have a responsible adult stay with you. It is important to have someone help care for you until you are awake and alert.  Rest as needed.  Do not: ? Participate in activities in which you could fall or become injured. ? Drive. ? Use heavy machinery. ? Drink alcohol. ? Take sleeping pills or medicines that cause drowsiness. ? Make important decisions or sign legal documents. ? Take care of children on your own. Eating and drinking  Follow any instructions from your health care provider about eating or drinking restrictions.  When you feel hungry, start by eating small amounts of foods that are soft and easy to digest (bland), such as toast. Gradually return to your regular diet.  Drink enough fluid to keep your urine pale  yellow.  If you vomit, rehydrate by drinking water, juice, or clear broth. General instructions  If you have sleep apnea, surgery and certain medicines can increase your risk for breathing problems. Follow instructions from your health care provider about wearing your sleep device: ? Anytime you are sleeping, including during daytime naps. ? While taking prescription pain medicines, sleeping medicines, or medicines that make you drowsy.  Return to your normal activities as told by your health care provider. Ask your health care provider what activities are safe for you.  Take over-the-counter and prescription medicines only as told by your health care provider.  If you smoke, do not smoke without supervision.  Keep all follow-up visits as told by your health care provider. This is important. Contact a health care provider if:  You have nausea or vomiting that does not get better with medicine.  You cannot eat or drink without vomiting.  You have pain that does not get better with medicine.  You are unable to pass urine.  You develop a skin rash.  You have a fever.  You have redness around your IV site that gets worse. Get help right away if:  You have difficulty breathing.  You have chest pain.  You have blood in your urine or stool, or you vomit blood. Summary  After the procedure, it is common to have a sore throat or nausea. It is also common to feel tired.  Have a responsible adult stay with you for the first 24 hours after general anesthesia. It is important to have someone help care for you until you  are awake and alert.  When you feel hungry, start by eating small amounts of foods that are soft and easy to digest (bland), such as toast. Gradually return to your regular diet.  Drink enough fluid to keep your urine pale yellow.  Return to your normal activities as told by your health care provider. Ask your health care provider what activities are safe for  you. This information is not intended to replace advice given to you by your health care provider. Make sure you discuss any questions you have with your health care provider. Document Revised: 02/19/2017 Document Reviewed: 10/02/2016 Elsevier Patient Education  [place].

## 2019-03-29 NOTE — Anesthesia Postprocedure Evaluation (Signed)
Anesthesia Post Note  Patient: Lori Mullins  Procedure(s) Performed: CARPAL TUNNEL RELEASE ENDOSCOPIC (Left Wrist)     Patient location during evaluation: Phase II Anesthesia Type: Bier Block Level of consciousness: awake, oriented and sedated Pain management: pain level controlled Vital Signs Assessment: post-procedure vital signs reviewed and stable Respiratory status: spontaneous breathing, nonlabored ventilation and respiratory function stable Cardiovascular status: blood pressure returned to baseline and stable Postop Assessment: no backache and no headache Anesthetic complications: no    Sinda Du

## 2019-03-29 NOTE — H&P (Signed)
Paper H&P to be scanned into permanent record. H&P reviewed and patient re-examined. No changes. 

## 2019-03-29 NOTE — Op Note (Signed)
03/29/2019  11:06 AM  Patient:   Lori Mullins  Pre-Op Diagnosis:   Left carpal tunnel syndrome.  Post-Op Diagnosis:   Same.  Procedure:   Endoscopic left carpal tunnel release.  Surgeon:   Pascal Lux, MD  Assistant:   Jeralyn Bennett, PA-S  Anesthesia:   General LMA  Findings:   As above.  Complications:   None  EBL:   0 cc  Fluids:   850 cc crystalloid  TT:   25 minutes at 250 mmHg  Drains:   None  Closure:   4-0 Prolene interrupted sutures  Brief Clinical Note:   The patient is a 54 year old female with a history of progressive worsening pain and paresthesias of her left hand. Her symptoms have persisted despite medications, activity modification, etc. Her history and examination consistent with carpal tunnel syndrome, confirmed by EMG. The patient presents at this time for an endoscopic left carpal tunnel release.   Procedure:   The patient was brought into the operating room and lain in the supine position. After adequate general laryngeal mask anesthesia was obtained, the left hand and upper extremity were prepped with ChloraPrep solution before being draped sterilely. Preoperative antibiotics were administered. A timeout was performed to verify the appropriate surgical site before the limb was exsanguinated with an Esmarch and the tourniquet inflated to 250 mmHg. An approximately 1.5-2 cm incision was made over the volar wrist flexion crease, centered over the palmaris longus tendon. The incision was carried down through the subcutaneous tissues with care taken to identify and protect any neurovascular structures. The distal forearm fascia was penetrated just proximal to the transverse carpal ligament. The soft tissues were released off the superficial and deep surfaces of the distal forearm fascia and this was released proximally for 3-4 cm under direct visualization.  Attention was directed distally. The Soil scientist was passed beneath the transverse carpal  ligament along the ulnar aspect of the carpal tunnel and used to release any adhesions as well as to remove any adherent synovial tissue before first the smaller then the larger of the two dilators were passed beneath the transverse carpal ligament along the ulnar margin of the carpal tunnel. The slotted cannula was introduced and the endoscope was placed into the slotted cannula and the undersurface of the transverse carpal ligament visualized. The distal margin of the transverse carpal ligament was marked by placing a 25-gauge needle percutaneously at Edmondson cardinal point so that it entered the distal portion of the slotted cannula. Under endoscopic visualization, the transverse carpal ligament was released from proximal to distal using the end-cutting blade. A second pass was performed to ensure complete release of the ligament. The adequacy of release was verified both endoscopically and by palpation using the freer elevator.  The wound was irrigated thoroughly with sterile saline solution before being closed using 4-0 Prolene interrupted sutures. A total of 10 cc of 0.5% plain Sensorcaine was injected in and around the incision before a sterile bulky dressing was applied to the wound. The patient was placed into a volar wrist splint before being awakened and returned to the recovery room in satisfactory condition after tolerating the procedure well.

## 2019-03-29 NOTE — Transfer of Care (Signed)
Immediate Anesthesia Transfer of Care Note  Patient: Lori Mullins  Procedure(s) Performed: CARPAL TUNNEL RELEASE ENDOSCOPIC (Left Wrist)  Patient Location: PACU  Anesthesia Type: Bier Block  Level of Consciousness: awake, alert  and patient cooperative  Airway and Oxygen Therapy: Patient Spontanous Breathing and Patient connected to supplemental oxygen  Post-op Assessment: Post-op Vital signs reviewed, Patient's Cardiovascular Status Stable, Respiratory Function Stable, Patent Airway and No signs of Nausea or vomiting  Post-op Vital Signs: Reviewed and stable  Complications: No apparent anesthesia complications

## 2019-03-30 ENCOUNTER — Encounter: Payer: Self-pay | Admitting: *Deleted

## 2019-04-07 ENCOUNTER — Other Ambulatory Visit: Payer: Self-pay | Admitting: Family Medicine

## 2019-04-07 NOTE — Telephone Encounter (Signed)
Requesting refill  Flexeril   LOV: 11/22/2018  LRF:  02/27/19

## 2019-04-27 ENCOUNTER — Other Ambulatory Visit: Payer: Self-pay | Admitting: Family Medicine

## 2019-05-02 ENCOUNTER — Encounter: Payer: Self-pay | Admitting: Family Medicine

## 2019-05-02 MED ORDER — DICLOFENAC SODIUM 1 % EX GEL: 2.0000 g | Freq: Four times a day (QID) | CUTANEOUS | 3 refills | Status: DC | PRN

## 2019-05-02 MED ORDER — BUSPIRONE HCL 7.5 MG PO TABS: 7.5000 mg | ORAL_TABLET | Freq: Two times a day (BID) | ORAL | 2 refills | Status: DC

## 2019-05-02 MED ORDER — HYDROCODONE-ACETAMINOPHEN 5-325 MG PO TABS: 1.0000 | ORAL_TABLET | Freq: Four times a day (QID) | ORAL | 0 refills | Status: DC | PRN

## 2019-05-02 MED ORDER — LOSARTAN POTASSIUM 100 MG PO TABS: 50.0000 mg | ORAL_TABLET | Freq: Every day | ORAL | 1 refills | Status: DC

## 2019-05-02 NOTE — Telephone Encounter (Signed)
Ok to refill??  Last office visit 11/22/2018.  Last refill 03/29/2019, #15 from Dr. Milagros Evener (ortho).

## 2019-05-06 ENCOUNTER — Other Ambulatory Visit: Payer: Self-pay

## 2019-05-06 ENCOUNTER — Ambulatory Visit: Payer: Managed Care, Other (non HMO) | Attending: Family Medicine

## 2019-05-06 DIAGNOSIS — Z23 Encounter for immunization: Secondary | ICD-10-CM | POA: Insufficient documentation

## 2019-05-06 NOTE — Progress Notes (Signed)
   Covid-19 Vaccination Clinic  Name:  ACASIA HELM    MRN: [id number] DOB: September 14, 1965  05/06/2019  Ms. Reckers was observed post Covid-19 immunization for 15 minutes without incident. She was provided with Vaccine Information Sheet and instruction to access the V-Safe system.   Ms. Bresee was instructed to call 911 with any severe reactions post vaccine: [name] Difficulty breathing  . Swelling of face and throat  . A fast heartbeat  . A bad rash all over body  . Dizziness and weakness   Immunizations Administered    Name Date Dose VIS Date Route   Moderna COVID-19 Vaccine 05/06/2019  8:15 AM 0.5 mL 01/31/2019 Intramuscular   Manufacturer: Moderna   Lot: [id number]   [id number]

## 2019-05-11 ENCOUNTER — Encounter (HOSPITAL_COMMUNITY): Payer: Self-pay

## 2019-05-11 ENCOUNTER — Other Ambulatory Visit: Payer: Self-pay

## 2019-05-12 ENCOUNTER — Encounter (HOSPITAL_COMMUNITY)
Admission: RE | Admit: 2019-05-12 | Discharge: 2019-05-12 | Disposition: A | Discharge status: Home / Self Care | Payer: Managed Care, Other (non HMO) | Source: Ambulatory Visit | Attending: Internal Medicine | Admitting: Internal Medicine

## 2019-05-12 ENCOUNTER — Other Ambulatory Visit (HOSPITAL_COMMUNITY)
Admission: RE | Admit: 2019-05-12 | Discharge: 2019-05-12 | Disposition: A | Discharge status: Home / Self Care | Payer: Managed Care, Other (non HMO) | Source: Ambulatory Visit | Attending: Internal Medicine | Admitting: Internal Medicine

## 2019-05-12 DIAGNOSIS — Z20822 Contact with and (suspected) exposure to covid-19: Secondary | ICD-10-CM | POA: Diagnosis not present

## 2019-05-12 DIAGNOSIS — Z01812 Encounter for preprocedural laboratory examination: Secondary | ICD-10-CM | POA: Insufficient documentation

## 2019-05-13 LAB — SARS CORONAVIRUS 2 (TAT 6-24 HRS): SARS Coronavirus 2: NEGATIVE

## 2019-05-15 ENCOUNTER — Ambulatory Visit (HOSPITAL_COMMUNITY): Payer: Managed Care, Other (non HMO) | Admitting: Anesthesiology

## 2019-05-15 ENCOUNTER — Encounter (HOSPITAL_COMMUNITY)
Admission: RE | Disposition: A | Discharge status: Home / Self Care | Payer: Self-pay | Source: Home / Self Care | Attending: Internal Medicine

## 2019-05-15 ENCOUNTER — Encounter (HOSPITAL_COMMUNITY): Payer: Self-pay | Admitting: Internal Medicine

## 2019-05-15 ENCOUNTER — Ambulatory Visit (HOSPITAL_COMMUNITY)
Admission: RE | Admit: 2019-05-15 | Discharge: 2019-05-15 | Disposition: A | Discharge status: Home / Self Care | Payer: Managed Care, Other (non HMO) | Attending: Internal Medicine | Admitting: Internal Medicine

## 2019-05-15 DIAGNOSIS — Z8249 Family history of ischemic heart disease and other diseases of the circulatory system: Secondary | ICD-10-CM | POA: Diagnosis not present

## 2019-05-15 DIAGNOSIS — R195 Other fecal abnormalities: Secondary | ICD-10-CM | POA: Diagnosis not present

## 2019-05-15 DIAGNOSIS — F419 Anxiety disorder, unspecified: Secondary | ICD-10-CM | POA: Insufficient documentation

## 2019-05-15 DIAGNOSIS — Z79899 Other long term (current) drug therapy: Secondary | ICD-10-CM | POA: Diagnosis not present

## 2019-05-15 DIAGNOSIS — K635 Polyp of colon: Secondary | ICD-10-CM

## 2019-05-15 DIAGNOSIS — I10 Essential (primary) hypertension: Secondary | ICD-10-CM | POA: Insufficient documentation

## 2019-05-15 DIAGNOSIS — K5730 Diverticulosis of large intestine without perforation or abscess without bleeding: Secondary | ICD-10-CM | POA: Diagnosis not present

## 2019-05-15 DIAGNOSIS — D122 Benign neoplasm of ascending colon: Secondary | ICD-10-CM | POA: Diagnosis not present

## 2019-05-15 DIAGNOSIS — F429 Obsessive-compulsive disorder, unspecified: Secondary | ICD-10-CM | POA: Insufficient documentation

## 2019-05-15 DIAGNOSIS — Z87891 Personal history of nicotine dependence: Secondary | ICD-10-CM | POA: Insufficient documentation

## 2019-05-15 DIAGNOSIS — M1990 Unspecified osteoarthritis, unspecified site: Secondary | ICD-10-CM | POA: Diagnosis not present

## 2019-05-15 DIAGNOSIS — G5603 Carpal tunnel syndrome, bilateral upper limbs: Secondary | ICD-10-CM | POA: Insufficient documentation

## 2019-05-15 DIAGNOSIS — D125 Benign neoplasm of sigmoid colon: Secondary | ICD-10-CM | POA: Insufficient documentation

## 2019-05-15 HISTORY — PX: POLYPECTOMY: SHX5525

## 2019-05-15 HISTORY — PX: COLONOSCOPY WITH PROPOFOL: SHX5780

## 2019-05-15 SURGERY — COLONOSCOPY WITH PROPOFOL
Anesthesia: General

## 2019-05-15 MED ORDER — LACTATED RINGERS IV SOLN: Freq: Once | INTRAVENOUS | Status: AC

## 2019-05-15 MED ORDER — PROPOFOL 500 MG/50ML IV EMUL
INTRAVENOUS | Status: DC | PRN
  Administered 2019-05-15: 200 ug/kg/min via INTRAVENOUS

## 2019-05-15 MED ORDER — LACTATED RINGERS IV SOLN: INTRAVENOUS | Status: DC | PRN

## 2019-05-15 MED ORDER — CHLORHEXIDINE GLUCONATE CLOTH 2 % EX PADS: 6.0000 | MEDICATED_PAD | Freq: Once | CUTANEOUS | Status: DC

## 2019-05-15 MED ORDER — PROPOFOL 10 MG/ML IV BOLUS
INTRAVENOUS | Status: AC
  Filled 2019-05-15: qty 40

## 2019-05-15 MED ORDER — DEXMEDETOMIDINE HCL 200 MCG/2ML IV SOLN
INTRAVENOUS | Status: DC | PRN
  Administered 2019-05-15: 8 ug via INTRAVENOUS

## 2019-05-15 MED ORDER — LIDOCAINE HCL (CARDIAC) PF 100 MG/5ML IV SOSY
PREFILLED_SYRINGE | INTRAVENOUS | Status: DC | PRN
  Administered 2019-05-15: 40 mg via INTRATRACHEAL

## 2019-05-15 MED ORDER — LIDOCAINE 2% (20 MG/ML) 5 ML SYRINGE
INTRAMUSCULAR | Status: AC
  Filled 2019-05-15: qty 5

## 2019-05-15 MED ORDER — PROPOFOL 10 MG/ML IV BOLUS
INTRAVENOUS | Status: DC | PRN
  Administered 2019-05-15: 50 mg via INTRAVENOUS
  Administered 2019-05-15: 100 mg via INTRAVENOUS
  Administered 2019-05-15 (×2): 50 mg via INTRAVENOUS

## 2019-05-15 NOTE — Op Note (Addendum)
[facility] Patient Name: Lori Mullins Procedure Date: 05/15/2019 7:04 AM MRN: [id number] Date of Birth: 12/04/1965 Attending MD: Norvel Richards , MD CSN: [id number] Age: 54 Admit Type: Outpatient Procedure:                Colonoscopy Indications:              Positive Cologuard test Providers:                Norvel Richards, MD, Jeanann Lewandowsky. Sharon Seller, RN,                            Aram Candela Referring MD:              Medicines:                Propofol per Anesthesia Complications:            No immediate complications. Estimated Blood Loss:     Estimated blood loss was minimal. Procedure:                Pre-Anesthesia Assessment:                           - Prior to the procedure, a History and Physical                            was performed, and patient medications and                            allergies were reviewed. The patient's tolerance of                            previous anesthesia was also reviewed. The risks                            and benefits of the procedure and the sedation                            options and risks were discussed with the patient.                            All questions were answered, and informed consent                            was obtained. Prior Anticoagulants: The patient has                            taken no previous anticoagulant or antiplatelet                            agents. ASA Grade Assessment: II - A patient with                            mild systemic disease. After reviewing the risks  and benefits, the patient was deemed in                            satisfactory condition to undergo the procedure.                           After obtaining informed consent, the colonoscope                            was passed under direct vision. Throughout the                            procedure, the patient's blood pressure, pulse, and                            oxygen saturations were  monitored continuously. The                            [id number] scope was introduced through                            the anus and advanced to the the cecum, identified                            by appendiceal orifice and ileocecal valve. The                            colonoscopy was performed without difficulty. The                            patient tolerated the procedure well. The quality                            of the bowel preparation was adequate. Scope In: 7:42:25 AM Scope Out: 8:04:58 AM Scope Withdrawal Time: 0 hours 13 minutes 58 seconds  Total Procedure Duration: 0 hours 22 minutes 33 seconds  Findings:      The perianal and digital rectal examinations were normal.      Scattered small-mouthed diverticula were found in the entire colon.      Four sessile polyps were found in the sigmoid colon and ascending colon.       The polyps were 3 to 9 mm in size. These polyps were removed with a cold       snare. Resection and retrieval were complete. Estimated blood loss was       minimal.      The exam was otherwise without abnormality on direct and retroflexion       views. Impression:               - Diverticulosis in the entire examined colon.                           - Four 3 to 9 mm polyps in the sigmoid colon and in  the ascending colon, removed with a cold snare.                            Resected and retrieved.                           - The examination was otherwise normal on direct                            and retroflexion views. Moderate Sedation:      Moderate (conscious) sedation was personally administered by an       anesthesia professional. The following parameters were monitored: oxygen       saturation, heart rate, blood pressure, respiratory rate, EKG, adequacy       of pulmonary ventilation, and response to care. Recommendation:           - Patient has a contact number available for                             emergencies. The signs and symptoms of potential                            delayed complications were discussed with the                            patient. Return to normal activities tomorrow.                            Written discharge instructions were provided to the                            patient.                           - Advance diet as tolerated.                           - Continue present medications.                           - Repeat colonoscopy date to be determined after                            pending pathology results are reviewed for                            surveillance based on pathology results.                           - Return to GI office (date not yet determined). Procedure Code(s):        --- Professional ---                           [id number], Colonoscopy, flexible; with removal of  tumor(s), polyp(s), or other lesion(s) by snare                            technique Diagnosis Code(s):        --- Professional ---                           K63.5, Polyp of colon                           R19.5, Other fecal abnormalities                           K57.30, Diverticulosis of large intestine without                            perforation or abscess without bleeding CPT copyright 2019 American Medical Association. All rights reserved. The codes documented in this report are preliminary and upon coder review may  be revised to meet current compliance requirements. Cristopher Estimable. Charvez Voorhies, MD Norvel Richards, MD 05/15/2019 8:16:18 AM This report has been signed electronically. Number of Addenda: 0

## 2019-05-15 NOTE — Anesthesia Postprocedure Evaluation (Signed)
Anesthesia Post Note  Patient: Lori Mullins  Procedure(s) Performed: COLONOSCOPY WITH PROPOFOL (N/A ) POLYPECTOMY  Patient location during evaluation: Endoscopy Anesthesia Type: General Level of consciousness: awake and alert Pain management: pain level controlled Vital Signs Assessment: post-procedure vital signs reviewed and stable Respiratory status: spontaneous breathing, nonlabored ventilation, respiratory function stable and patient connected to nasal cannula oxygen Cardiovascular status: blood pressure returned to baseline and stable Postop Assessment: no apparent nausea or vomiting Anesthetic complications: no     Last Vitals:  Vitals:   05/15/19 0815 05/15/19 0830  BP: 139/88 (!) 154/97  Pulse: 94 78  Resp: 15 18  Temp: 36.5 C 36.5 C  SpO2: 98% 94%    Last Pain:  Vitals:   05/15/19 0830  TempSrc: Oral  PainSc: 0-No pain                 [name]

## 2019-05-15 NOTE — Anesthesia Preprocedure Evaluation (Signed)
Anesthesia Evaluation  Patient identified by MRN, date of birth, ID band Patient awake    Reviewed: Allergy & Precautions, NPO status , Patient's Chart, lab work & pertinent test results  Airway Mallampati: II  TM Distance: >3 FB Neck ROM: Full   Comment: Cervical discectomy Dental   Crown:   Pulmonary former smoker,    Pulmonary exam normal breath sounds clear to auscultation       Cardiovascular Exercise Tolerance: Good METS: 3 - Mets hypertension, Pt. on medications Normal cardiovascular exam Rhythm:Regular Rate:Normal     Neuro/Psych PSYCHIATRIC DISORDERS Anxiety  Neuromuscular disease    GI/Hepatic negative GI ROS, (+)     substance abuse (last use - 05/12/19)  marijuana use,   Endo/Other  negative endocrine ROS  Renal/GU negative Renal ROS     Musculoskeletal  (+) Arthritis ,   Abdominal   Peds  Hematology   Anesthesia Other Findings   Reproductive/Obstetrics                            Anesthesia Physical Anesthesia Plan  ASA: II  Anesthesia Plan: General   Post-op Pain Management:    Induction: Intravenous  PONV Risk Score and Plan: 2 and TIVA  Airway Management Planned: Nasal Cannula, Natural Airway and Simple Face Mask  Additional Equipment:   Intra-op Plan:   Post-operative Plan:   Informed Consent: I have reviewed the patients History and Physical, chart, labs and discussed the procedure including the risks, benefits and alternatives for the proposed anesthesia with the patient or authorized representative who has indicated his/her understanding and acceptance.     Dental advisory given  Plan Discussed with: CRNA and Surgeon  Anesthesia Plan Comments:        Anesthesia Quick Evaluation

## 2019-05-15 NOTE — Discharge Instructions (Signed)
Colonoscopy Discharge Instructions  Read the instructions outlined below and refer to this sheet in the next few weeks. These discharge instructions provide you with general information on caring for yourself after you leave the hospital. Your doctor may also give you specific instructions. While your treatment has been planned according to the most current medical practices available, unavoidable complications occasionally occur. If you have any problems or questions after discharge, call Dr. [name] at [phone]. ACTIVITY  You may resume your regular activity, but move at a slower pace for the next 24 hours.   Take frequent rest periods for the next 24 hours.   Walking will help get rid of the air and reduce the bloated feeling in your belly (abdomen).   No driving for 24 hours (because of the medicine (anesthesia) used during the test).    Do not sign any important legal documents or operate any machinery for 24 hours (because of the anesthesia used during the test).  NUTRITION  Drink plenty of fluids.   You may resume your normal diet as instructed by your doctor.   Begin with a light meal and progress to your normal diet. Heavy or fried foods are harder to digest and may make you feel sick to your stomach (nauseated).   Avoid alcoholic beverages for 24 hours or as instructed.  MEDICATIONS  You may resume your normal medications unless your doctor tells you otherwise.  WHAT YOU CAN EXPECT TODAY  Some feelings of bloating in the abdomen.   Passage of more gas than usual.   Spotting of blood in your stool or on the toilet paper.  IF YOU HAD POLYPS REMOVED DURING THE COLONOSCOPY:  No aspirin products for 7 days or as instructed.   No alcohol for 7 days or as instructed.   Eat a soft diet for the next 24 hours.  FINDING OUT THE RESULTS OF YOUR TEST Not all test results are available during your visit. If your test results are not back during the visit, make an appointment  with your caregiver to find out the results. Do not assume everything is normal if you have not heard from your caregiver or the medical facility. It is important for you to follow up on all of your test results.  SEEK IMMEDIATE MEDICAL ATTENTION IF:  You have more than a spotting of blood in your stool.   Your belly is swollen (abdominal distention).   You are nauseated or vomiting.   You have a temperature over 101.   You have abdominal pain or discomfort that is severe or gets worse throughout the day.    Diverticulosis  Diverticulosis is a condition that develops when small pouches (diverticula) form in the wall of the large intestine (colon). The colon is where water is absorbed and stool (feces) is formed. The pouches form when the inside layer of the colon pushes through weak spots in the outer layers of the colon. You may have a few pouches or many of them. The pouches usually do not cause problems unless they become inflamed or infected. When this happens, the condition is called diverticulitis. What are the causes? The cause of this condition is not known. What increases the risk? The following factors may make you more likely to develop this condition:  Being older than age 36. Your risk for this condition increases with age. Diverticulosis is rare among people younger than age 78. By age 39, many people have it.  Eating a low-fiber diet.  Having  frequent constipation.  Being overweight.  Not getting enough exercise.  Smoking.  Taking over-the-counter pain medicines, like aspirin and ibuprofen.  Having a family history of diverticulosis. What are the signs or symptoms? In most people, there are no symptoms of this condition. If you do have symptoms, they may include:  Bloating.  Cramps in the abdomen.  Constipation or diarrhea.  Pain in the lower left side of the abdomen. How is this diagnosed? Because diverticulosis usually has no symptoms, it is most  often diagnosed during an exam for other colon problems. The condition may be diagnosed by:  Using a flexible scope to examine the colon (colonoscopy).  Taking an X-ray of the colon after dye has been put into the colon (barium enema).  Having a CT scan. How is this treated? You may not need treatment for this condition. Your health care provider may recommend treatment to prevent problems. You may need treatment if you have symptoms or if you previously had diverticulitis. Treatment may include:  Eating a high-fiber diet.  Taking a fiber supplement.  Taking a live bacteria supplement (probiotic).  Taking medicine to relax your colon. Follow these instructions at home: Medicines  Take over-the-counter and prescription medicines only as told by your health care provider.  If told by your health care provider, take a fiber supplement or probiotic. Constipation prevention Your condition may cause constipation. To prevent or treat constipation, you may need to:  Drink enough fluid to keep your urine pale yellow.  Take over-the-counter or prescription medicines.  Eat foods that are high in fiber, such as beans, whole grains, and fresh fruits and vegetables.  Limit foods that are high in fat and processed sugars, such as fried or sweet foods.  General instructions  Try not to strain when you have a bowel movement.  Keep all follow-up visits as told by your health care provider. This is important. Contact a health care provider if you:  Have pain in your abdomen.  Have bloating.  Have cramps.  Have not had a bowel movement in 3 days. Get help right away if:  Your pain gets worse.  Your bloating becomes very bad.  You have a fever or chills, and your symptoms suddenly get worse.  You vomit.  You have bowel movements that are bloody or black.  You have bleeding from your rectum. Summary  Diverticulosis is a condition that develops when small pouches (diverticula)  form in the wall of the large intestine (colon).  You may have a few pouches or many of them.  This condition is most often diagnosed during an exam for other colon problems.  Treatment may include increasing the fiber in your diet, taking supplements, or taking medicines. This information is not intended to replace advice given to you by your health care provider. Make sure you discuss any questions you have with your health care provider. Document Revised: 09/15/2018 Document Reviewed: 09/15/2018 Elsevier Patient Education  [place].  Colon Polyps  Polyps are tissue growths inside the body. Polyps can grow in many places, including the large intestine (colon). A polyp may be a round bump or a mushroom-shaped growth. You could have one polyp or several. Most colon polyps are noncancerous (benign). However, some colon polyps can become cancerous over time. Finding and removing the polyps early can help prevent this. What are the causes? The exact cause of colon polyps is not known. What increases the risk? You are more likely to develop this condition if  you:  Have a family history of colon cancer or colon polyps.  Are older than 57 or older than 45 if you are African American.  Have inflammatory bowel disease, such as ulcerative colitis or Crohn's disease.  Have certain hereditary conditions, such as: ? Familial adenomatous polyposis. ? Lynch syndrome. ? Turcot syndrome. ? Peutz-Jeghers syndrome.  Are overweight.  Smoke cigarettes.  Do not get enough exercise.  Drink too much alcohol.  Eat a diet that is high in fat and red meat and low in fiber.  Had childhood cancer that was treated with abdominal radiation. What are the signs or symptoms? Most polyps do not cause symptoms. If you have symptoms, they may include:  Blood coming from your rectum when having a bowel movement.  Blood in your stool. The stool may look dark red or black.  Abdominal pain.  A  change in bowel habits, such as constipation or diarrhea. How is this diagnosed? This condition is diagnosed with a colonoscopy. This is a procedure in which a lighted, flexible scope is inserted into the anus and then passed into the colon to examine the area. Polyps are sometimes found when a colonoscopy is done as part of routine cancer screening tests. How is this treated? Treatment for this condition involves removing any polyps that are found. Most polyps can be removed during a colonoscopy. Those polyps will then be tested for cancer. Additional treatment may be needed depending on the results of testing. Follow these instructions at home: Lifestyle  Maintain a healthy weight, or lose weight if recommended by your health care provider.  Exercise every day or as told by your health care provider.  Do not use any products that contain nicotine or tobacco, such as cigarettes and e-cigarettes. If you need help quitting, ask your health care provider.  If you drink alcohol, limit how much you have: ? 0-1 drink a day for women. ? 0-2 drinks a day for men.  Be aware of how much alcohol is in your drink. In the [place] one drink equals one 12 oz bottle of beer (355 mL), one 5 oz glass of wine (148 mL), or one 1 oz shot of hard liquor (44 mL). Eating and drinking   Eat foods that are high in fiber, such as fruits, vegetables, and whole grains.  Eat foods that are high in calcium and vitamin D, such as milk, cheese, yogurt, eggs, liver, fish, and broccoli.  Limit foods that are high in fat, such as fried foods and desserts.  Limit the amount of red meat and processed meat you eat, such as hot dogs, sausage, bacon, and lunch meats. General instructions  Keep all follow-up visits as told by your health care provider. This is important. ? This includes having regularly scheduled colonoscopies. ? Talk to your health care provider about when you need a colonoscopy. Contact a health care  provider if:  You have new or worsening bleeding during a bowel movement.  You have new or increased blood in your stool.  You have a change in bowel habits.  You lose weight for no known reason. Summary  Polyps are tissue growths inside the body. Polyps can grow in many places, including the colon.  Most colon polyps are noncancerous (benign), but some can become cancerous over time.  This condition is diagnosed with a colonoscopy.  Treatment for this condition involves removing any polyps that are found. Most polyps can be removed during a colonoscopy. This information is not intended  to replace advice given to you by your health care provider. Make sure you discuss any questions you have with your health care provider. Document Revised: 06/03/2017 Document Reviewed: 06/03/2017 Elsevier Patient Education  [place].   Colon polyp and diverticulosis information provided  Further recommendations to follow pending review of pathology report  At patient request, I called Leonette Most at [phone] and reviewed results

## 2019-05-15 NOTE — Transfer of Care (Signed)
Immediate Anesthesia Transfer of Care Note  Patient: Lori Mullins  Procedure(s) Performed: COLONOSCOPY WITH PROPOFOL (N/A ) POLYPECTOMY  Patient Location: PACU  Anesthesia Type:General  Level of Consciousness: awake, alert  and oriented  Airway & Oxygen Therapy: Patient Spontanous Breathing  Post-op Assessment: Report given to RN, Post -op Vital signs reviewed and stable and Patient moving all extremities X 4  Post vital signs: Reviewed and stable  Last Vitals:  Vitals Value Taken Time  BP    Temp    Pulse 102 05/15/19 0812  Resp 47 05/15/19 0812  SpO2 100 % 05/15/19 0812  Vitals shown include unvalidated device data.  Last Pain:  Vitals:   05/15/19 0736  TempSrc:   PainSc: 0-No pain         Complications: No apparent anesthesia complications

## 2019-05-15 NOTE — H&P (Signed)
@LOGO @   Primary Care Physician:  [name], MD Primary Gastroenterologist:  Dr. [name]  Pre-Procedure History & Physical: HPI:  Lori Mullins is a 54 y.o. adult here for positive cologuard  Past Medical History:  Diagnosis Date  . Anxiety   . Arthritis    Hip/Hands (right)  . Bilateral carpal tunnel syndrome 11/29/2018  . Campylobacter intestinal infection 1996   Became systemic  . Cellulitis    occular  . Hypertension   . OCD (obsessive compulsive disorder) 2007  . Plantar fasciitis    seen by orthoStevie Kern    Past Surgical History:  Procedure Laterality Date  . CARPAL TUNNEL RELEASE Left 03/29/2019   Procedure: CARPAL TUNNEL RELEASE ENDOSCOPIC;  Surgeon: Corky Mull, MD;  Location: [facility];  Service: Orthopedics;  Laterality: Left;  . CERVICAL DISCECTOMY     C4-C5  . RETINAL DETACHMENT SURGERY Left 2005    Prior to Admission medications   Medication Sig Start Date End Date Taking? Authorizing Provider  ALPRAZolam Duanne Moron) 1 MG tablet TAKE 1 TABLET BY MOUTH TWICE A DAY AS NEEDED 03/27/19  Yes [place], Modena Nunnery, MD  busPIRone (BUSPAR) 7.5 MG tablet Take 1 tablet (7.5 mg total) by mouth 2 (two) times daily. 05/02/19  Yes [place], Modena Nunnery, MD  cyclobenzaprine (FLEXERIL) 10 MG tablet TAKE 1 TABLET BY MOUTH THREE TIMES A DAY AS NEEDED FOR MUSCLE SPASMS 04/07/19  Yes [place], Modena Nunnery, MD  diclofenac Sodium (VOLTAREN) 1 % GEL Apply 2 g topically 4 (four) times daily as needed. 05/02/19  Yes [place], Modena Nunnery, MD  diphenhydrAMINE HCl, Sleep, (ZZZQUIL PO) Take by mouth at bedtime as needed.   Yes [provider]  HYDROcodone-acetaminophen (NORCO/VICODIN) 5-325 MG tablet Take 1 tablet by mouth every 6 (six) hours as needed. 05/02/19  Yes [place], Modena Nunnery, MD  loratadine (CLARITIN) 10 MG tablet Take 10 mg by mouth daily.   Yes [provider]  losartan (COZAAR) 100 MG tablet Take 0.5 tablets (50 mg total) by mouth daily. 05/02/19  Yes , Modena Nunnery,  MD  Multiple Vitamins-Minerals (MULTIVITAMIN PO) Take 1 tablet by mouth daily.    Yes [provider]  OVER THE COUNTER MEDICATION Take 5,000 mg by mouth daily. Full spectrum CBD Supplement    Yes [provider]    Allergies as of 12/08/2018 - Review Complete 12/08/2018  Allergen Reaction Noted  . 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 Hypertension Father   . Colon cancer Neg Hx   . Colon polyps Neg Hx     Social History   Socioeconomic History  . Marital status: Married    Spouse name: Not on file  . Number of children: Not on file  . Years of education: Not on file  . Highest education level: Not on file  Occupational History  . Not on file  Tobacco Use  . Smoking status: Former Smoker    Packs/day: 2.00    Years: 20.00    Pack years: 40.00    Types: Cigarettes    Quit date: 2010    Years since quitting: 11.2  . Smokeless tobacco: Never Used  Substance and Sexual Activity  . Alcohol use: Yes    Alcohol/week: 7.0 standard drinks    Types: 7 Standard drinks or equivalent per week    Comment: occasionally  . Drug use: Yes    Types: Marijuana  . Sexual activity: Yes  Other Topics Concern  . Not on file  Social History Narrative  . Not on file   Social Determinants of Health   Financial Resource Strain:   . Difficulty of Paying Living Expenses:   Food Insecurity:   . Worried About Charity fundraiser in the Last Year:   . Arboriculturist in the Last Year:   Transportation Needs:   . Film/video editor (Medical):   [name] Lack of Transportation (Non-Medical):   Physical Activity:   . Days of Exercise per Week:   . Minutes of Exercise per Session:   Stress:   . Feeling of Stress :   Social Connections:   . Frequency of Communication with Friends and Family:   . Frequency of Social Gatherings with Friends and Family:   . Attends Religious Services:   . Active Member of Clubs or  Organizations:   . Attends Archivist Meetings:   [name] Marital Status:   Intimate Partner Violence:   . Fear of Current or Ex-Partner:   . Emotionally Abused:   [name] Physically Abused:   . Sexually Abused:     Review of Systems: See HPI, otherwise negative ROS  Physical Exam: BP (!) 160/88   Pulse 92   Temp 98.6 F (37 C) (Oral)   Resp 18   LMP 03/01/2017   SpO2 96%  General:   Alert,  Well-developed, well-nourished, pleasant and cooperative in NAD Neck:  Supple; no masses or thyromegaly. No significant cervical adenopathy. Lungs:  Clear throughout to auscultation.   No wheezes, crackles, or rhonchi. No acute distress. Heart:  Regular rate and rhythm; no murmurs, clicks, rubs,  or gallops. Abdomen: Non-distended, normal bowel sounds.  Soft and nontender without appreciable mass or hepatosplenomegaly.  Pulses:  Normal pulses noted. Extremities:  Without clubbing or edema.  Impression/Plan:  54 y/o femal here for a positive cologuard.  The risks, benefits, limitations, alternatives and imponderables have been reviewed with the patient. Questions have been answered. All parties are agreeable.     Notice: This dictation was prepared with Dragon dictation along with smaller phrase technology. Any transcriptional errors that result from this process are unintentional and may not be corrected upon review.

## 2019-05-16 ENCOUNTER — Other Ambulatory Visit: Payer: Self-pay | Admitting: Surgery

## 2019-05-16 LAB — SURGICAL PATHOLOGY

## 2019-05-17 ENCOUNTER — Encounter: Payer: Self-pay | Admitting: Internal Medicine

## 2019-05-23 ENCOUNTER — Encounter: Payer: Self-pay | Admitting: Family Medicine

## 2019-05-23 MED ORDER — ALPRAZOLAM 1 MG PO TABS: 1.0000 mg | ORAL_TABLET | Freq: Two times a day (BID) | ORAL | 1 refills | Status: DC | PRN

## 2019-05-23 MED ORDER — CYCLOBENZAPRINE HCL 10 MG PO TABS: 10.0000 mg | ORAL_TABLET | Freq: Three times a day (TID) | ORAL | 1 refills | Status: DC | PRN

## 2019-05-23 MED ORDER — TRAMADOL HCL 50 MG PO TABS: 50.0000 mg | ORAL_TABLET | Freq: Two times a day (BID) | ORAL | 1 refills | Status: DC | PRN

## 2019-05-23 NOTE — Telephone Encounter (Signed)
Ok to refill??  Last office visit 11/22/2018.  Last refill on Xanax 03/27/2019, #1 refill.   Last refill on Flexeril 04/07/2019, #1 refill.   Last refill on Tramadol 02/27/2019, #1 refill.

## 2019-05-25 ENCOUNTER — Encounter
Admission: RE | Admit: 2019-05-25 | Discharge: 2019-05-25 | Disposition: A | Discharge status: Home / Self Care | Payer: Managed Care, Other (non HMO) | Source: Ambulatory Visit | Attending: Surgery | Admitting: Surgery

## 2019-05-25 ENCOUNTER — Other Ambulatory Visit: Payer: Self-pay

## 2019-05-25 NOTE — Patient Instructions (Signed)
Your procedure is scheduled on: 05/31/19 Report to [facility]. To find out your arrival time please call [phone] between 1PM - 3PM on 05/30/19.  Remember: Instructions that are not followed completely may result in serious medical risk, up to and including death, or upon the discretion of your surgeon and anesthesiologist your surgery may need to be rescheduled.     _X__ 1. Do not eat food after midnight the night before your procedure.                 No gum chewing or hard candies. You may drink clear liquids up to 2 hours                 before you are scheduled to arrive for your surgery- DO not drink clear                 liquids within 2 hours of the start of your surgery.                 Clear Liquids include:  water, apple juice without pulp, clear carbohydrate                 drink such as Clearfast or Gatorade, Black Coffee or Tea (Do not add                 anything to coffee or tea). Diabetics water only  __X__2.  On the morning of surgery brush your teeth with toothpaste and water, you                 may rinse your mouth with mouthwash if you wish.  Do not swallow any              toothpaste of mouthwash.     _X__ 3.  No Alcohol for 24 hours before or after surgery.   _X__ 4.  Do Not Smoke or use e-cigarettes For 24 Hours Prior to Your Surgery.                 Do not use any chewable tobacco products for at least 6 hours prior to                 surgery.  ____  5.  Bring all medications with you on the day of surgery if instructed.   __X__  6.  Notify your doctor if there is any change in your medical condition      (cold, fever, infections).     Do not wear jewelry, make-up, hairpins, clips or nail polish. Do not wear lotions, powders, or perfumes.  Do not shave 48 hours prior to surgery. Men may shave face and neck. Do not bring valuables to the hospital.    [facility] is not responsible for any belongings or  valuables.  Contacts, dentures/partials or body piercings may not be worn into surgery. Bring a case for your contacts, glasses or hearing aids, a denture cup will be supplied. Leave your suitcase in the car. After surgery it may be brought to your room. For patients admitted to the hospital, discharge time is determined by your treatment team.   Patients discharged the day of surgery will not be allowed to drive home.   Please read over the following fact sheets that you were given:     __X__ Take these medicines the morning of surgery with A SIP OF WATER:  1. busPIRone (BUSPAR) 7.5 MG tablet  2. loratadine (CLARITIN) 10 MG tablet  3. You may take your pain medication if needed also  4.  5.  6.  ____ Fleet Enema (as directed)   __X__ Use CHG Soap/SAGE wipes as directed  Liberal use  ____ Use inhalers on the day of surgery  ____ Stop metformin/Janumet/Farxiga 2 days prior to surgery    ____ Take 1/2 of usual insulin dose the night before surgery. No insulin the morning          of surgery.   ____ Stop Blood Thinners Coumadin/Plavix/Xarelto/Pleta/Pradaxa/Eliquis/Effient/Aspirin  on   Or contact your Surgeon, Cardiologist or Medical Doctor regarding  ability to stop your blood thinners  __X__ Stop Anti-inflammatories 7 days before surgery such as Advil, Ibuprofen, Motrin,  BC or Goodies Powder, Naprosyn, Naproxen, Aleve, Aspirin    __X__ Stop all herbal supplements, fish oil or vitamin E until after surgery.    ____ Bring C-Pap to the hospital.    Ensure pre surgery drink needs to be completed 2 hours before arrival. Review Incentive Spirometer instructions.

## 2019-05-29 ENCOUNTER — Other Ambulatory Visit
Admission: RE | Admit: 2019-05-29 | Discharge: 2019-05-29 | Disposition: A | Discharge status: Home / Self Care | Payer: Managed Care, Other (non HMO) | Source: Ambulatory Visit | Attending: Surgery | Admitting: Surgery

## 2019-05-29 ENCOUNTER — Other Ambulatory Visit: Payer: Self-pay

## 2019-05-29 DIAGNOSIS — Z20822 Contact with and (suspected) exposure to covid-19: Secondary | ICD-10-CM | POA: Diagnosis not present

## 2019-05-29 DIAGNOSIS — R9431 Abnormal electrocardiogram [ECG] [EKG]: Secondary | ICD-10-CM | POA: Diagnosis not present

## 2019-05-29 DIAGNOSIS — I10 Essential (primary) hypertension: Secondary | ICD-10-CM | POA: Insufficient documentation

## 2019-05-29 DIAGNOSIS — Z01818 Encounter for other preprocedural examination: Secondary | ICD-10-CM | POA: Diagnosis present

## 2019-05-29 DIAGNOSIS — I444 Left anterior fascicular block: Secondary | ICD-10-CM | POA: Diagnosis not present

## 2019-05-29 LAB — SARS CORONAVIRUS 2 (TAT 6-24 HRS): SARS Coronavirus 2: NEGATIVE

## 2019-05-29 NOTE — Pre-Procedure Instructions (Signed)
Pre-Admit Testing Provider Communication Note  Provider Notified: Dr. Andree Elk (Anesthesia)  Notification Mode: Secure Chat  Reason: EKG  Response:    Additional Information: Noted on Pre-Admit Worksheet.  Signed: Beulah Gandy, RN

## 2019-05-31 ENCOUNTER — Ambulatory Visit: Payer: Managed Care, Other (non HMO) | Admitting: Anesthesiology

## 2019-05-31 ENCOUNTER — Encounter
Admission: RE | Disposition: A | Discharge status: Home / Self Care | Payer: Self-pay | Source: Home / Self Care | Attending: Surgery

## 2019-05-31 ENCOUNTER — Other Ambulatory Visit: Payer: Self-pay

## 2019-05-31 ENCOUNTER — Ambulatory Visit
Admission: RE | Admit: 2019-05-31 | Discharge: 2019-05-31 | Disposition: A | Discharge status: Home / Self Care | Payer: Managed Care, Other (non HMO) | Attending: Surgery | Admitting: Surgery

## 2019-05-31 ENCOUNTER — Encounter: Payer: Self-pay | Admitting: Surgery

## 2019-05-31 DIAGNOSIS — Z79899 Other long term (current) drug therapy: Secondary | ICD-10-CM | POA: Insufficient documentation

## 2019-05-31 DIAGNOSIS — I10 Essential (primary) hypertension: Secondary | ICD-10-CM | POA: Insufficient documentation

## 2019-05-31 DIAGNOSIS — Z87891 Personal history of nicotine dependence: Secondary | ICD-10-CM | POA: Diagnosis not present

## 2019-05-31 DIAGNOSIS — G5601 Carpal tunnel syndrome, right upper limb: Secondary | ICD-10-CM | POA: Diagnosis not present

## 2019-05-31 DIAGNOSIS — Z791 Long term (current) use of non-steroidal anti-inflammatories (NSAID): Secondary | ICD-10-CM | POA: Diagnosis not present

## 2019-05-31 HISTORY — PX: CARPAL TUNNEL RELEASE: SHX101

## 2019-05-31 SURGERY — RELEASE, CARPAL TUNNEL, ENDOSCOPIC
Anesthesia: General | Site: Wrist | Laterality: Right

## 2019-05-31 MED ORDER — CHLORHEXIDINE GLUCONATE 4 % EX LIQD
60.0000 mL | Freq: Once | CUTANEOUS | Status: AC
  Administered 2019-05-31: 4 via TOPICAL

## 2019-05-31 MED ORDER — ONDANSETRON HCL 4 MG/2ML IJ SOLN
INTRAMUSCULAR | Status: DC | PRN
  Administered 2019-05-31: 4 mg via INTRAVENOUS

## 2019-05-31 MED ORDER — MIDAZOLAM HCL 2 MG/2ML IJ SOLN
INTRAMUSCULAR | Status: AC
  Filled 2019-05-31: qty 2

## 2019-05-31 MED ORDER — ONDANSETRON HCL 4 MG/2ML IJ SOLN: 4.0000 mg | Freq: Once | INTRAMUSCULAR | Status: DC | PRN

## 2019-05-31 MED ORDER — HYDROCODONE-ACETAMINOPHEN 5-325 MG PO TABS: 1.0000 | ORAL_TABLET | Freq: Four times a day (QID) | ORAL | 0 refills | Status: DC | PRN

## 2019-05-31 MED ORDER — LIDOCAINE HCL (CARDIAC) PF 100 MG/5ML IV SOSY
PREFILLED_SYRINGE | INTRAVENOUS | Status: DC | PRN
  Administered 2019-05-31: 60 mg via INTRAVENOUS

## 2019-05-31 MED ORDER — FENTANYL CITRATE (PF) 100 MCG/2ML IJ SOLN
INTRAMUSCULAR | Status: AC
  Filled 2019-05-31: qty 2

## 2019-05-31 MED ORDER — BUPIVACAINE HCL (PF) 0.5 % IJ SOLN
INTRAMUSCULAR | Status: DC | PRN
  Administered 2019-05-31: 10 mL

## 2019-05-31 MED ORDER — PROPOFOL 10 MG/ML IV BOLUS
INTRAVENOUS | Status: AC
  Filled 2019-05-31: qty 40

## 2019-05-31 MED ORDER — CEFAZOLIN SODIUM-DEXTROSE 2-4 GM/100ML-% IV SOLN
INTRAVENOUS | Status: AC
  Filled 2019-05-31: qty 100

## 2019-05-31 MED ORDER — PROPOFOL 10 MG/ML IV BOLUS
INTRAVENOUS | Status: DC | PRN
  Administered 2019-05-31: 150 mg via INTRAVENOUS
  Administered 2019-05-31: 50 mg via INTRAVENOUS

## 2019-05-31 MED ORDER — LACTATED RINGERS IV SOLN: INTRAVENOUS | Status: DC

## 2019-05-31 MED ORDER — SEVOFLURANE IN SOLN
RESPIRATORY_TRACT | Status: AC
  Filled 2019-05-31: qty 250

## 2019-05-31 MED ORDER — FENTANYL CITRATE (PF) 100 MCG/2ML IJ SOLN
INTRAMUSCULAR | Status: DC | PRN
  Administered 2019-05-31 (×2): 25 ug via INTRAVENOUS

## 2019-05-31 MED ORDER — FENTANYL CITRATE (PF) 100 MCG/2ML IJ SOLN: 25.0000 ug | INTRAMUSCULAR | Status: DC | PRN

## 2019-05-31 MED ORDER — MIDAZOLAM HCL 2 MG/2ML IJ SOLN
INTRAMUSCULAR | Status: DC | PRN
  Administered 2019-05-31: 2 mg via INTRAVENOUS

## 2019-05-31 MED ORDER — FAMOTIDINE 20 MG PO TABS
ORAL_TABLET | ORAL | Status: AC
  Administered 2019-05-31: 20 mg via ORAL
  Filled 2019-05-31: qty 1

## 2019-05-31 MED ORDER — BUPIVACAINE HCL (PF) 0.5 % IJ SOLN
INTRAMUSCULAR | Status: AC
  Filled 2019-05-31: qty 30

## 2019-05-31 MED ORDER — ONDANSETRON HCL 4 MG/2ML IJ SOLN
INTRAMUSCULAR | Status: AC
  Filled 2019-05-31: qty 2

## 2019-05-31 MED ORDER — CEFAZOLIN SODIUM-DEXTROSE 2-4 GM/100ML-% IV SOLN
2.0000 g | INTRAVENOUS | Status: AC
  Administered 2019-05-31: 2 g via INTRAVENOUS

## 2019-05-31 MED ORDER — FAMOTIDINE 20 MG PO TABS: 20.0000 mg | ORAL_TABLET | Freq: Once | ORAL | Status: AC

## 2019-05-31 MED ORDER — DEXAMETHASONE SODIUM PHOSPHATE 10 MG/ML IJ SOLN
INTRAMUSCULAR | Status: DC | PRN
  Administered 2019-05-31: 10 mg via INTRAVENOUS

## 2019-05-31 SURGICAL SUPPLY — 31 items
BNDG COHESIVE 4X5 TAN STRL (GAUZE/BANDAGES/DRESSINGS) ×3 IMPLANT
BNDG ELASTIC 2X5.8 VLCR STR LF (GAUZE/BANDAGES/DRESSINGS) ×3 IMPLANT
BNDG ESMARK 4X12 TAN STRL LF (GAUZE/BANDAGES/DRESSINGS) ×3 IMPLANT
CANISTER SUCT 1200ML W/VALVE (MISCELLANEOUS) ×3 IMPLANT
CHLORAPREP W/TINT 26 (MISCELLANEOUS) ×3 IMPLANT
CORD BIP STRL DISP 12FT (MISCELLANEOUS) ×3 IMPLANT
COVER WAND RF STERILE (DRAPES) ×3 IMPLANT
CUFF TOURN SGL QUICK 18X4 (TOURNIQUET CUFF) ×3 IMPLANT
DRAPE SURG 17X11 SM STRL (DRAPES) ×3 IMPLANT
FORCEPS JEWEL BIP 4-3/4 STR (INSTRUMENTS) ×3 IMPLANT
GAUZE SPONGE 4X4 12PLY STRL (GAUZE/BANDAGES/DRESSINGS) ×3 IMPLANT
GAUZE XEROFORM 1X8 LF (GAUZE/BANDAGES/DRESSINGS) ×3 IMPLANT
GLOVE BIO SURGEON STRL SZ8 (GLOVE) ×3 IMPLANT
GLOVE INDICATOR 8.0 STRL GRN (GLOVE) ×3 IMPLANT
GOWN STRL REUS W/ TWL LRG LVL3 (GOWN DISPOSABLE) ×1 IMPLANT
GOWN STRL REUS W/ TWL XL LVL3 (GOWN DISPOSABLE) ×1 IMPLANT
GOWN STRL REUS W/TWL LRG LVL3 (GOWN DISPOSABLE) ×3
GOWN STRL REUS W/TWL XL LVL3 (GOWN DISPOSABLE) ×3
KIT CARPAL TUNNEL (MISCELLANEOUS) ×3
KIT ESCP INSRT D SLOT CANN KN (MISCELLANEOUS) ×1 IMPLANT
KIT TURNOVER KIT A (KITS) ×3 IMPLANT
NS IRRIG 500ML POUR BTL (IV SOLUTION) ×3 IMPLANT
PACK EXTREMITY ARMC (MISCELLANEOUS) ×3 IMPLANT
SPLINT WRIST LG LT TX990309 (SOFTGOODS) IMPLANT
SPLINT WRIST LG RT TX900304 (SOFTGOODS) IMPLANT
SPLINT WRIST M LT TX990308 (SOFTGOODS) IMPLANT
SPLINT WRIST M RT TX990303 (SOFTGOODS) ×3 IMPLANT
SPLINT WRIST XL LT TX990310 (SOFTGOODS) IMPLANT
SPLINT WRIST XL RT TX990305 (SOFTGOODS) IMPLANT
STOCKINETTE IMPERVIOUS 9X36 MD (GAUZE/BANDAGES/DRESSINGS) ×3 IMPLANT
SUT PROLENE 4 0 PS 2 18 (SUTURE) ×3 IMPLANT

## 2019-05-31 NOTE — Transfer of Care (Signed)
Immediate Anesthesia Transfer of Care Note  Patient: Lori Mullins  Procedure(s) Performed: CARPAL TUNNEL RELEASE ENDOSCOPIC (Right Wrist)  Patient Location: PACU  Anesthesia Type:General  Level of Consciousness: awake, alert , oriented and patient cooperative  Airway & Oxygen Therapy: Patient Spontanous Breathing and Patient connected to face mask oxygen  Post-op Assessment: Report given to RN and Post -op Vital signs reviewed and stable  Post vital signs: Reviewed and stable  Last Vitals:  Vitals Value Taken Time  BP 147/102 05/31/19 0836  Temp    Pulse 85 05/31/19 0838  Resp 22 05/31/19 0838  SpO2 100 % 05/31/19 0838  Vitals shown include unvalidated device data.  Last Pain:  Vitals:   05/31/19 0628  TempSrc: Temporal  PainSc: 0-No pain         Complications: No apparent anesthesia complications

## 2019-05-31 NOTE — Progress Notes (Signed)
Pt educated and demonstrated use of incentive spirometer with a goal of 2250 ml.

## 2019-05-31 NOTE — Anesthesia Preprocedure Evaluation (Signed)
Anesthesia Evaluation  Patient identified by MRN, date of birth, ID band Patient awake    Reviewed: Allergy & Precautions, NPO status , Patient's Chart, lab work & pertinent test results  History of Anesthesia Complications Negative for: history of anesthetic complications  Airway Mallampati: II       Dental   Pulmonary neg sleep apnea, neg COPD, Not current smoker, former smoker,           Cardiovascular hypertension, Pt. on medications (-) Past MI and (-) CHF (-) dysrhythmias (-) Valvular Problems/Murmurs     Neuro/Psych neg Seizures Anxiety    GI/Hepatic Neg liver ROS, neg GERD  ,  Endo/Other  neg diabetes  Renal/GU negative Renal ROS     Musculoskeletal   Abdominal   Peds  Hematology   Anesthesia Other Findings   Reproductive/Obstetrics                             Anesthesia Physical Anesthesia Plan  ASA: II  Anesthesia Plan: General   Post-op Pain Management:    Induction: Intravenous  PONV Risk Score and Plan: 3 and Ondansetron, Dexamethasone and Midazolam  Airway Management Planned: LMA  Additional Equipment:   Intra-op Plan:   Post-operative Plan:   Informed Consent: I have reviewed the patients History and Physical, chart, labs and discussed the procedure including the risks, benefits and alternatives for the proposed anesthesia with the patient or authorized representative who has indicated his/her understanding and acceptance.       Plan Discussed with:   Anesthesia Plan Comments:         Anesthesia Quick Evaluation

## 2019-05-31 NOTE — Anesthesia Postprocedure Evaluation (Signed)
Anesthesia Post Note  Patient: Lori Mullins  Procedure(s) Performed: CARPAL TUNNEL RELEASE ENDOSCOPIC (Right Wrist)  Patient location during evaluation: PACU Anesthesia Type: General Level of consciousness: awake and alert Pain management: pain level controlled Vital Signs Assessment: post-procedure vital signs reviewed and stable Respiratory status: spontaneous breathing and respiratory function stable Cardiovascular status: stable Anesthetic complications: no     Last Vitals:  Vitals:   05/31/19 0923 05/31/19 0934  BP: (!) 176/89 (!) 173/91  Pulse:  77  Resp:    Temp:    SpO2:  98%    Last Pain:  Vitals:   05/31/19 0902  TempSrc: Temporal  PainSc: 0-No pain                 Erial Fikes K

## 2019-05-31 NOTE — Anesthesia Procedure Notes (Signed)
Procedure Name: LMA Insertion Performed by: Gentry Fitz, CRNA Pre-anesthesia Checklist: Patient identified, Emergency Drugs available, Suction available and Patient being monitored Patient Re-evaluated:Patient Re-evaluated prior to induction Oxygen Delivery Method: Circle system utilized Preoxygenation: Pre-oxygenation with 100% oxygen Induction Type: IV induction Ventilation: Mask ventilation without difficulty LMA: LMA inserted LMA Size: 4.0 Number of attempts: 1 Placement Confirmation: positive ETCO2,  breath sounds checked- equal and bilateral and CO2 detector Tube secured with: Tape Dental Injury: Teeth and Oropharynx as per pre-operative assessment

## 2019-05-31 NOTE — H&P (Signed)
Paper H&P to be scanned into permanent record. H&P reviewed and patient re-examined. No changes. 

## 2019-05-31 NOTE — Discharge Instructions (Addendum)
Orthopedic discharge instructions: Keep dressing dry and intact. Keep hand elevated above heart level. May shower after dressing removed on postop day 4 (Sunday). Cover sutures with Band-Aids after drying off. Apply ice to affected area frequently. Take ibuprofen 600-800 mg TID with meals for 7-10 days, then as necessary. Take ES Tylenol or pain medication as prescribed when needed.  Return for follow-up in 10-14 days or as scheduled.  AMBULATORY SURGERY  DISCHARGE INSTRUCTIONS   1) The drugs that you were given will stay in your system until tomorrow so for the next 24 hours you should not:  A) Drive an automobile B) Make any legal decisions C) Drink any alcoholic beverage   2) You may resume regular meals tomorrow.  Today it is better to start with liquids and gradually work up to solid foods.  You may eat anything you prefer, but it is better to start with liquids, then soup and crackers, and gradually work up to solid foods.   3) Please notify your doctor immediately if you have any unusual bleeding, trouble breathing, redness and pain at the surgery site, drainage, fever, or pain not relieved by medication.    4) Additional Instructions:  Please contact your physician with any problems or Same Day Surgery at [phone], Monday through Friday 6 am to 4 pm, or [place] [facility] Main number at [phone]. 

## 2019-05-31 NOTE — Op Note (Signed)
05/31/2019  8:30 AM  Patient:   Lori Mullins  Pre-Op Diagnosis:   Right carpal tunnel syndrome.  Post-Op Diagnosis:   Same.  Procedure:   Endoscopic right carpal tunnel release.  Surgeon:   Pascal Lux, MD  Anesthesia:   General LMA  Findings:   As above.  Complications:   None  EBL:   0 cc  Fluids:   500 cc crystalloid  TT:   13 minutes at 250 mmHg  Drains:   None  Closure:   4-0 Prolene interrupted sutures  Brief Clinical Note:   The patient is a 54 year old female with a long history of progressively worsening pain and paresthesias to her right hand. Her symptoms have progressed despite medications, activity modification, splinting, etc. Her history and examination are consistent with carpal tunnel syndrome, confirmed by EMG. The patient presents at this time for an endoscopic right carpal tunnel release.   Procedure:   The patient was brought into the operating room and lain in the supine position. After adequate general laryngeal mask anesthesia was obtained, the right hand and upper extremity were prepped with ChloraPrep solution before being draped sterilely. Preoperative antibiotics were administered. A timeout was performed to verify the appropriate surgical site before the limb was exsanguinated with an Esmarch and the tourniquet inflated to 250 mmHg. An approximately 1.5-2 cm incision was made over the volar wrist flexion crease, centered over the palmaris longus tendon. The incision was carried down through the subcutaneous tissues with care taken to identify and protect any neurovascular structures. The distal forearm fascia was penetrated just proximal to the transverse carpal ligament. The soft tissues were released off the superficial and deep surfaces of the distal forearm fascia and this was released proximally for 3-4 cm under direct visualization.  Attention was directed distally. The Soil scientist was passed beneath the transverse carpal ligament along  the ulnar aspect of the carpal tunnel and used to release any adhesions as well as to remove any adherent synovial tissue before first the smaller then the larger of the two dilators were passed beneath the transverse carpal ligament along the ulnar margin of the carpal tunnel. The slotted cannula was introduced and the endoscope was placed into the slotted cannula and the undersurface of the transverse carpal ligament visualized. The distal margin of the transverse carpal ligament was marked by placing a 25-gauge needle percutaneously at [place] cardinal point so that it entered the distal portion of the slotted cannula. Under endoscopic visualization, the transverse carpal ligament was released from proximal to distal using the end-cutting blade. A second pass was performed to ensure complete release of the ligament. The adequacy of release was verified both endoscopically and by palpation using the freer elevator.  The wound was irrigated thoroughly with sterile saline solution before being closed using 4-0 Prolene interrupted sutures. A total of 10 cc of 0.5% plain Sensorcaine was injected in and around the incision before a sterile bulky dressing was applied to the wound. The patient was placed into a volar wrist splint before being awakened and returned to the recovery room in satisfactory condition after tolerating the procedure well.

## 2019-06-07 ENCOUNTER — Ambulatory Visit: Payer: Managed Care, Other (non HMO) | Attending: Internal Medicine

## 2019-06-07 DIAGNOSIS — Z23 Encounter for immunization: Secondary | ICD-10-CM

## 2019-06-07 NOTE — Progress Notes (Signed)
   Covid-19 Vaccination Clinic  Name:  Lori Mullins    MRN: [id number] DOB: 12-28-1965  06/07/2019  [name] was observed post Covid-19 immunization for 15 minutes without incident. She was provided with Vaccine Information Sheet and instruction to access the V-Safe system.   [name] was instructed to call 911 with any severe reactions post vaccine: [name] Difficulty breathing  . Swelling of face and throat  . A fast heartbeat  . A bad rash all over body  . Dizziness and weakness   Immunizations Administered    Name Date Dose VIS Date Route   Moderna COVID-19 Vaccine 06/07/2019  3:57 PM 0.5 mL 01/31/2019 Intramuscular   Manufacturer: Levan Hurst   LotUD:[id number]   LaplaceBE:[id number]

## 2019-06-08 ENCOUNTER — Other Ambulatory Visit (HOSPITAL_COMMUNITY): Payer: Self-pay | Admitting: Family Medicine

## 2019-06-08 DIAGNOSIS — Z1231 Encounter for screening mammogram for malignant neoplasm of breast: Secondary | ICD-10-CM

## 2019-06-13 ENCOUNTER — Ambulatory Visit (INDEPENDENT_AMBULATORY_CARE_PROVIDER_SITE_OTHER): Payer: Managed Care, Other (non HMO) | Admitting: Family Medicine

## 2019-06-13 ENCOUNTER — Other Ambulatory Visit: Payer: Self-pay

## 2019-06-13 ENCOUNTER — Encounter: Payer: Self-pay | Admitting: Family Medicine

## 2019-06-13 VITALS — BP 132/74 | HR 96 | Temp 98.4°F | Resp 14 | Ht 67.0 in | Wt 218.0 lb

## 2019-06-13 DIAGNOSIS — Z0000 Encounter for general adult medical examination without abnormal findings: Secondary | ICD-10-CM

## 2019-06-13 DIAGNOSIS — M8949 Other hypertrophic osteoarthropathy, multiple sites: Secondary | ICD-10-CM | POA: Diagnosis not present

## 2019-06-13 DIAGNOSIS — I10 Essential (primary) hypertension: Secondary | ICD-10-CM

## 2019-06-13 DIAGNOSIS — Z6834 Body mass index (BMI) 34.0-34.9, adult: Secondary | ICD-10-CM

## 2019-06-13 DIAGNOSIS — F411 Generalized anxiety disorder: Secondary | ICD-10-CM

## 2019-06-13 DIAGNOSIS — Z0001 Encounter for general adult medical examination with abnormal findings: Secondary | ICD-10-CM | POA: Diagnosis not present

## 2019-06-13 DIAGNOSIS — E6609 Other obesity due to excess calories: Secondary | ICD-10-CM

## 2019-06-13 DIAGNOSIS — M159 Polyosteoarthritis, unspecified: Secondary | ICD-10-CM

## 2019-06-13 NOTE — Patient Instructions (Addendum)
Decrease buspar to  1 tablet daily , then stop  F/U 6 months

## 2019-06-13 NOTE — Progress Notes (Signed)
   Subjective:    Patient ID: Lori Mullins, adult    DOB: [birth year], 54 y.o.   MRN: [id number]  Patient presents for Annual Exam (is not fasting)   Pt here for CPE  s/p bilat carpal tunnel syndrome surgery and is doing fairly well.  Status post colonoscopy -she had 4 polyps removed, colonscopy every 3 years    Mammogram scheduled for May 2021   Planning on restarting Yoga   Vaccine- COVID-19 vaccine    HTN- taking 50mg  once a day   Chronic pain- taking ultram 2 in the morning and acetaminophen during the day  norco on weekends when more active has more pain  Generalized anxiety she has been doing well with the BuSpar.  Now that she settled into her new job and things are improving she would like to start tapering off of the BuSpar to see how she does  Immunizations up-to-date  PAP Smear UTD 2019   Lipid panel normal in December.  Due for metabolic panel Review Of Systems:  GEN- denies fatigue, fever, weight loss,weakness, recent illness HEENT- denies eye drainage, change in vision, nasal discharge, CVS- denies chest pain, palpitations RESP- denies SOB, cough, wheeze ABD- denies N/V, change in stools, abd pain GU- denies dysuria, hematuria, dribbling, incontinence MSK- denies joint pain, muscle aches, injury Neuro- denies headache, dizziness, syncope, seizure activity       Objective:    BP 132/74   Pulse 96   Temp 98.4 F (36.9 C) (Temporal)   Resp 14   Ht 5\' 7"  (1.702 m)   Wt 218 lb (98.9 kg)   LMP 03/01/2017   SpO2 99%   BMI 34.14 kg/m  GEN- NAD, alert and oriented x3 HEENT- PERRL, EOMI, non injected sclera, pink conjunctiva, MMM, oropharynx clear Neck- Supple, no thyromegaly CVS- RRR, no murmur RESP-CTAB ABD-NABS,soft,NT,ND Psych- normal affect and mood  EXT- No edema Pulses- Radial, DP- 2+        Assessment & Plan:      Problem List Items Addressed This Visit      Unprioritized   Essential hypertension, benign    Pressure is  controlled we will check metabolic panel.  No change in medication      Relevant Orders   CBC with Differential/Platelet (Completed)   Basic metabolic panel (Completed)   GAD (generalized anxiety disorder)    Decrease BuSpar down to 7.5 mg once a day for the next 4 weeks then she can discontinue.  If she notes an increase in her symptoms she will let me know we will like to go back to the twice daily dosing      OA (osteoarthritis)    Continue with tramadol during the day acetaminophen and use of hydrocodone on the weekend she is aware not to mix her hydrocodone with her tramadol      Relevant Medications   acetaminophen (TYLENOL) 325 MG tablet   Obesity    She is working on dietary changes and now getting more active since she status post her surgery       Other Visit Diagnoses    Routine general medical examination at a health care facility    -  Primary   CPE done, immunizations UTD, mammo scheduled, PAP UTD       Note: This dictation was prepared with Dragon dictation along with smaller phrase technology. Any transcriptional errors that result from this process are unintentional.

## 2019-06-14 ENCOUNTER — Encounter: Payer: Self-pay | Admitting: Family Medicine

## 2019-06-14 LAB — CBC WITH DIFFERENTIAL/PLATELET
Absolute Monocytes: 504 cells/uL (ref 200–950)
Basophils Absolute: 67 cells/uL (ref 0–200)
Basophils Relative: 0.8 %
Eosinophils Absolute: 328 cells/uL (ref 15–500)
Eosinophils Relative: 3.9 %
HCT: 43 % (ref 35.0–45.0)
Hemoglobin: 14.4 g/dL (ref 11.7–15.5)
Lymphs Abs: 2898 cells/uL (ref 850–3900)
MCH: 30.3 pg (ref 27.0–33.0)
MCHC: 33.5 g/dL (ref 32.0–36.0)
MCV: 90.3 fL (ref 80.0–100.0)
MPV: 10.3 fL (ref 7.5–12.5)
Monocytes Relative: 6 %
Neutro Abs: 4603 cells/uL (ref 1500–7800)
Neutrophils Relative %: 54.8 %
Platelets: 275 10*3/uL (ref 140–400)
RBC: 4.76 10*6/uL (ref 3.80–5.10)
RDW: 11.8 % (ref 11.0–15.0)
Total Lymphocyte: 34.5 %
WBC: 8.4 10*3/uL (ref 3.8–10.8)

## 2019-06-14 LAB — BASIC METABOLIC PANEL
BUN: 17 mg/dL (ref 7–25)
CO2: 25 mmol/L (ref 20–32)
Calcium: 9.9 mg/dL (ref 8.6–10.4)
Chloride: 102 mmol/L (ref 98–110)
Creat: 0.73 mg/dL (ref 0.50–1.05)
Glucose, Bld: 101 mg/dL — ABNORMAL HIGH (ref 65–99)
Potassium: 4.3 mmol/L (ref 3.5–5.3)
Sodium: 138 mmol/L (ref 135–146)

## 2019-06-14 NOTE — Assessment & Plan Note (Signed)
Continue with tramadol during the day acetaminophen and use of hydrocodone on the weekend she is aware not to mix her hydrocodone with her tramadol

## 2019-06-14 NOTE — Assessment & Plan Note (Signed)
Decrease BuSpar down to 7.5 mg once a day for the next 4 weeks then she can discontinue.  If she notes an increase in her symptoms she will let me know we will like to go back to the twice daily dosing

## 2019-06-14 NOTE — Assessment & Plan Note (Signed)
She is working on dietary changes and now getting more active since she status post her surgery

## 2019-06-14 NOTE — Assessment & Plan Note (Signed)
Pressure is controlled we will check metabolic panel.  No change in medication

## 2019-06-15 ENCOUNTER — Encounter: Payer: Self-pay | Admitting: *Deleted

## 2019-07-03 ENCOUNTER — Encounter: Payer: Self-pay | Admitting: Family Medicine

## 2019-07-03 DIAGNOSIS — M159 Polyosteoarthritis, unspecified: Secondary | ICD-10-CM

## 2019-07-03 DIAGNOSIS — Z4789 Encounter for other orthopedic aftercare: Secondary | ICD-10-CM

## 2019-07-11 ENCOUNTER — Encounter: Payer: Self-pay | Admitting: Family Medicine

## 2019-07-12 ENCOUNTER — Other Ambulatory Visit: Payer: Self-pay | Admitting: Family Medicine

## 2019-07-12 NOTE — Telephone Encounter (Signed)
Ok to refill 

## 2019-07-14 ENCOUNTER — Other Ambulatory Visit: Payer: Self-pay | Admitting: Family Medicine

## 2019-07-14 ENCOUNTER — Encounter: Payer: Self-pay | Admitting: Family Medicine

## 2019-07-14 MED ORDER — HYDROCODONE-ACETAMINOPHEN 5-325 MG PO TABS: 1.0000 | ORAL_TABLET | Freq: Four times a day (QID) | ORAL | 0 refills | Status: DC | PRN

## 2019-07-14 NOTE — Telephone Encounter (Signed)
Ok to refill??  Last office visit 06/13/2019.  Last refill 05/31/2019 by Dr. Roland Rack.

## 2019-07-24 ENCOUNTER — Ambulatory Visit (HOSPITAL_COMMUNITY)
Admission: RE | Admit: 2019-07-24 | Discharge: 2019-07-24 | Disposition: A | Discharge status: Home / Self Care | Payer: Managed Care, Other (non HMO) | Source: Ambulatory Visit | Attending: Family Medicine | Admitting: Family Medicine

## 2019-07-24 ENCOUNTER — Other Ambulatory Visit: Payer: Self-pay

## 2019-07-24 DIAGNOSIS — Z1231 Encounter for screening mammogram for malignant neoplasm of breast: Secondary | ICD-10-CM | POA: Insufficient documentation

## 2019-07-25 ENCOUNTER — Other Ambulatory Visit: Payer: Self-pay | Admitting: Family Medicine

## 2019-07-25 NOTE — Telephone Encounter (Signed)
Ok to refill??  Last office visit 06/13/2019.  Last refill 05/23/2019, #1 refill.

## 2019-08-07 ENCOUNTER — Encounter: Payer: Self-pay | Admitting: Podiatry

## 2019-08-07 ENCOUNTER — Other Ambulatory Visit: Payer: Managed Care, Other (non HMO) | Admitting: Orthotics

## 2019-08-07 ENCOUNTER — Other Ambulatory Visit: Payer: Self-pay

## 2019-08-07 ENCOUNTER — Ambulatory Visit (INDEPENDENT_AMBULATORY_CARE_PROVIDER_SITE_OTHER): Payer: Managed Care, Other (non HMO) | Admitting: Podiatry

## 2019-08-07 ENCOUNTER — Ambulatory Visit: Payer: Managed Care, Other (non HMO)

## 2019-08-07 DIAGNOSIS — Q666 Other congenital valgus deformities of feet: Secondary | ICD-10-CM

## 2019-08-07 DIAGNOSIS — M779 Enthesopathy, unspecified: Secondary | ICD-10-CM | POA: Diagnosis not present

## 2019-08-13 ENCOUNTER — Other Ambulatory Visit: Payer: Self-pay | Admitting: Family Medicine

## 2019-08-14 ENCOUNTER — Encounter: Payer: Self-pay | Admitting: Family Medicine

## 2019-08-14 MED ORDER — HYDROCODONE-ACETAMINOPHEN 5-325 MG PO TABS: 1.0000 | ORAL_TABLET | Freq: Four times a day (QID) | ORAL | 0 refills | Status: DC | PRN

## 2019-08-14 NOTE — Telephone Encounter (Signed)
Ok to refill 

## 2019-08-14 NOTE — Telephone Encounter (Signed)
Ok to refill??  Last office visit 06/13/2019.  Last refill 07/14/2019.

## 2019-08-14 NOTE — Progress Notes (Signed)
Subjective:   Patient ID: Lori Mullins, adult   DOB: 54 y.o.   MRN: [id number]   HPI 54 year old female presents the office today requesting new orthotics.  She has had orthotics for quite some time due to flatfeet.  A puppy recently chewed up her right orthotic and she needs new inserts.  And says that she has are currently approximately 56 years old.  She has no pain 15 she denies any numbness or tingling.   Review of Systems  All other systems reviewed and are negative.  Past Medical History:  Diagnosis Date  . Anxiety   . Arthritis    Hip/Hands (right)  . Bilateral carpal tunnel syndrome 11/29/2018  . Campylobacter intestinal infection 1996   Became systemic  . Cellulitis    occular  . Hypertension   . OCD (obsessive compulsive disorder) 2007  . Plantar fasciitis    seen by orthoStevie Kern    Past Surgical History:  Procedure Laterality Date  . CARPAL TUNNEL RELEASE Left 03/29/2019   Procedure: CARPAL TUNNEL RELEASE ENDOSCOPIC;  Surgeon: Corky Mull, MD;  Location: [facility];  Service: Orthopedics;  Laterality: Left;  . CARPAL TUNNEL RELEASE Right 05/31/2019   Procedure: CARPAL TUNNEL RELEASE ENDOSCOPIC;  Surgeon: Corky Mull, MD;  Location: [facility];  Service: Orthopedics;  Laterality: Right;  . CERVICAL DISCECTOMY     C4-C5  . COLONOSCOPY WITH PROPOFOL N/A 05/15/2019   Procedure: COLONOSCOPY WITH PROPOFOL;  Surgeon: Daneil Dolin, MD;  [facility];  Service: Endoscopy;  Laterality: N/A;  11:15am  . POLYPECTOMY  05/15/2019   Procedure: POLYPECTOMY;  Surgeon: Daneil Dolin, MD;  Location: [facility];  Service: Endoscopy;;  . RETINAL DETACHMENT SURGERY Left 2005     Current Outpatient Medications:  .  acetaminophen (TYLENOL) 325 MG tablet, Take 650 mg by mouth every 6 (six) hours as needed., Disp: , Rfl:  .  ALPRAZolam (XANAX) 1 MG tablet, TAKE 1 TABLET(1 MG) BY MOUTH TWICE DAILY AS NEEDED, Disp: 60 tablet, Rfl: 1 .  CANNABIDIOL PO, Take 1  capsule by mouth daily. Full spectrum CBD Supplement, Disp: , Rfl:  .  cyclobenzaprine (FLEXERIL) 10 MG tablet, TAKE 1 TABLET(10 MG) BY MOUTH THREE TIMES DAILY AS NEEDED FOR MUSCLE SPASMS, Disp: 45 tablet, Rfl: 1 .  diclofenac Sodium (VOLTAREN) 1 % GEL, Apply 2 g topically 4 (four) times daily as needed. (Patient taking differently: Apply 2 g topically in the morning, at noon, and at bedtime. ), Disp: 300 g, Rfl: 3 .  Glycerin-Hypromellose-PEG 400 (DRY EYE RELIEF DROPS) 0.2-0.2-1 % SOLN, Place 1 drop into both eyes 3 (three) times daily as needed (DRY/IRRITATED EYES.)., Disp: , Rfl:  .  HYDROcodone-acetaminophen (NORCO/VICODIN) 5-325 MG tablet, Take 1 tablet by mouth every 6 (six) hours as needed (pain.)., Disp: 30 tablet, Rfl: 0 .  loratadine (CLARITIN) 10 MG tablet, Take 10 mg by mouth daily., Disp: , Rfl:  .  losartan (COZAAR) 100 MG tablet, Take 0.5 tablets (50 mg total) by mouth daily., Disp: 45 tablet, Rfl: 1 .  Multiple Vitamin (MULTIVITAMIN WITH MINERALS) TABS tablet, Take 1 tablet by mouth daily., Disp: , Rfl:   Allergies  Allergen Reactions  . Milk-Related Compounds Anaphylaxis          Objective:  Physical Exam  General: AAO x3, NAD  Dermatological: Skin is warm, dry and supple bilateral. Nails x 10 are well manicured; remaining integument appears unremarkable at this time. There are no open  sores, no preulcerative lesions, no rash or signs of infection present.  Vascular: Dorsalis Pedis artery and Posterior Tibial artery pedal pulses are 2/4 bilateral with immedate capillary fill time. Pedal hair growth present. No varicosities and no lower extremity edema present bilateral. There is no pain with calf compression, swelling, warmth, erythema.   Neruologic: Grossly intact via light touch bilateral.   Musculoskeletal: Significant decrease in medial arch height upon weightbearing.  Ankle, subtalar joint range of motion intact but any restrictions.  No ear tenderness.  Muscular  strength 5/5 in all groups tested bilateral.  Gait: Unassisted, Nonantalgic.       Assessment:   Pes planovalgus     Plan:  Today she was measured for new orthotics by Liliane Channel in the office.  Discussed wearing supportive shoes.  She currently denies any pain.  Discussed general stretching exercises.  I will see her as needed.  She will follow-up in 4 weeks to pick up orthotics with Ivin Booty DPM

## 2019-08-29 ENCOUNTER — Encounter: Payer: Self-pay | Admitting: Family Medicine

## 2019-08-29 MED ORDER — ALPRAZOLAM 1 MG PO TABS: ORAL_TABLET | ORAL | 1 refills | Status: DC

## 2019-08-29 MED ORDER — TRAMADOL HCL 50 MG PO TABS: ORAL_TABLET | ORAL | 1 refills | Status: DC

## 2019-08-29 NOTE — Telephone Encounter (Signed)
Ok to refill xanax??  Last office visit 06/13/2019.  Last refill 07/25/2019.

## 2019-08-29 NOTE — Telephone Encounter (Signed)
Ok to refill??  Last office visit 06/13/2019.  Last refill 05/23/2019, #1 refills.

## 2019-08-29 NOTE — Addendum Note (Signed)
Addended by: Sheral Flow on: 08/29/2019 02:35 PM   Modules accepted: Orders

## 2019-09-05 ENCOUNTER — Ambulatory Visit: Payer: Managed Care, Other (non HMO) | Admitting: Orthotics

## 2019-09-05 ENCOUNTER — Other Ambulatory Visit: Payer: Self-pay

## 2019-09-05 ENCOUNTER — Other Ambulatory Visit: Payer: Managed Care, Other (non HMO) | Admitting: Orthotics

## 2019-09-05 DIAGNOSIS — Q666 Other congenital valgus deformities of feet: Secondary | ICD-10-CM

## 2019-09-05 DIAGNOSIS — M779 Enthesopathy, unspecified: Secondary | ICD-10-CM

## 2019-09-05 NOTE — Progress Notes (Signed)
Patient came in today to pick up custom made foot orthotics.  The goals were accomplished and the patient reported no dissatisfaction with said orthotics.  Patient was advised of breakin period and how to report any issues. 

## 2019-09-12 ENCOUNTER — Other Ambulatory Visit: Payer: Self-pay | Admitting: Family Medicine

## 2019-09-12 NOTE — Telephone Encounter (Signed)
Ok to refill 

## 2019-09-14 ENCOUNTER — Encounter: Payer: Self-pay | Admitting: Family Medicine

## 2019-09-14 NOTE — Telephone Encounter (Signed)
Ok to refill??  Last office visit 06/13/2019  Last refill 08/14/2019.

## 2019-09-15 MED ORDER — HYDROCODONE-ACETAMINOPHEN 5-325 MG PO TABS: 1.0000 | ORAL_TABLET | Freq: Four times a day (QID) | ORAL | 0 refills | Status: DC | PRN

## 2019-10-11 ENCOUNTER — Other Ambulatory Visit: Payer: Self-pay | Admitting: Family Medicine

## 2019-10-16 ENCOUNTER — Other Ambulatory Visit: Payer: Self-pay | Admitting: Family Medicine

## 2019-10-17 MED ORDER — HYDROCODONE-ACETAMINOPHEN 5-325 MG PO TABS: 1.0000 | ORAL_TABLET | Freq: Four times a day (QID) | ORAL | 0 refills | Status: DC | PRN

## 2019-10-17 NOTE — Telephone Encounter (Signed)
Requested Prescriptions   Pending Prescriptions Disp Refills  . HYDROcodone-acetaminophen (NORCO/VICODIN) 5-325 MG tablet 30 tablet 0    Sig: Take 1 tablet by mouth every 6 (six) hours as needed (pain.).    Last OV 06/13/2019   Last written 09/15/2019

## 2019-11-05 ENCOUNTER — Other Ambulatory Visit: Payer: Self-pay | Admitting: Family Medicine

## 2019-11-07 NOTE — Telephone Encounter (Signed)
Ok to refill??  Last office visit 06/13/2019.  Last refill 08/29/2019, #1 refill.

## 2019-11-08 ENCOUNTER — Other Ambulatory Visit: Payer: Self-pay | Admitting: Family Medicine

## 2019-11-08 NOTE — Telephone Encounter (Signed)
Ok to refill 

## 2019-11-14 ENCOUNTER — Encounter: Payer: Self-pay | Admitting: Family Medicine

## 2019-11-17 ENCOUNTER — Encounter: Payer: Self-pay | Admitting: Family Medicine

## 2019-11-17 MED ORDER — HYDROCODONE-ACETAMINOPHEN 5-325 MG PO TABS: 1.0000 | ORAL_TABLET | Freq: Four times a day (QID) | ORAL | 0 refills | Status: DC | PRN

## 2019-11-17 NOTE — Telephone Encounter (Signed)
Ok to refill??  Last office visit 06/13/2019.  Last refill 10/17/2019.

## 2019-11-18 ENCOUNTER — Other Ambulatory Visit: Payer: Self-pay | Admitting: Family Medicine

## 2019-12-01 ENCOUNTER — Ambulatory Visit (INDEPENDENT_AMBULATORY_CARE_PROVIDER_SITE_OTHER): Payer: Managed Care, Other (non HMO) | Admitting: Family Medicine

## 2019-12-01 ENCOUNTER — Encounter: Payer: Self-pay | Admitting: Family Medicine

## 2019-12-01 VITALS — BP 130/64 | HR 72 | Temp 98.1°F | Resp 16 | Ht 67.0 in | Wt 215.0 lb

## 2019-12-01 DIAGNOSIS — M25551 Pain in right hip: Secondary | ICD-10-CM

## 2019-12-01 DIAGNOSIS — Z23 Encounter for immunization: Secondary | ICD-10-CM

## 2019-12-01 DIAGNOSIS — M1611 Unilateral primary osteoarthritis, right hip: Secondary | ICD-10-CM

## 2019-12-01 MED ORDER — SHINGRIX 50 MCG/0.5ML IM SUSR: 0.5000 mL | Freq: Once | INTRAMUSCULAR | 1 refills | Status: AC

## 2019-12-01 NOTE — Addendum Note (Signed)
Addended by: Sheral Flow on: 12/01/2019 05:47 PM   Modules accepted: Orders

## 2019-12-01 NOTE — Patient Instructions (Addendum)
Referral to orthopedics  F/U as previous

## 2019-12-01 NOTE — Progress Notes (Signed)
   Subjective:    Patient ID: Lori Mullins, adult    DOB: 08-21-1965, 54 y.o.   MRN: [id number]  Patient presents for R Hip Pain (x4 months hip gets stiff and feels like it is disjointed- pain radiates down through thigh and knee)   Pt here with worsening right hip/groin pain, has been progressively worse over the past 4 months.  She has stiffness that goes into her groin.  She has not had any particular injury.  He does not feel like her chronic back pain.  She denies any new tingling or numbness in the lower extremities.     She is scheduled for right hand pain/OA surgery in December     She would like FLu vaccine and thShingrix  Review Of Systems:  GEN- denies fatigue, fever, weight loss,weakness, recent illness HEENT- denies eye drainage, change in vision, nasal discharge, CVS- denies chest pain, palpitations RESP- denies SOB, cough, wheeze ABD- denies N/V, change in stools, abd pain GU- denies dysuria, hematuria, dribbling, incontinence MSK- + joint pain, muscle aches, injury Neuro- denies headache, dizziness, syncope, seizure activity       Objective:    BP 130/64   Pulse 72   Temp 98.1 F (36.7 C) (Temporal)   Resp 16   Ht 5\' 7"  (1.702 m)   Wt 215 lb (97.5 kg)   LMP 03/01/2017   SpO2 99%   BMI 33.67 kg/m  GEN- NAD, alert and oriented x3 CVS- RRR, no murmur RESP-CTAB Musculoskeletal fair range of motion of spine hips pain with internal rotation of right hip, fair range of motion bilateral knees no effusion EXT- No edema Pulses- Radial,2+        Assessment & Plan:      Problem List Items Addressed This Visit      Unprioritized   OA (osteoarthritis)   Relevant Orders   Ambulatory referral to Orthopedic Surgery    Other Visit Diagnoses    Right hip pain    -  Primary   concern for osteoarthritis of the hip based on her previous history with arthritis.  Referral to orthopedics she has tramadol and hydrocodone   Relevant Orders   Ambulatory referral  to Orthopedic Surgery      Note: This dictation was prepared with Dragon dictation along with smaller phrase technology. Any transcriptional errors that result from this process are unintentional.

## 2019-12-04 ENCOUNTER — Other Ambulatory Visit: Payer: Self-pay | Admitting: Family Medicine

## 2019-12-11 ENCOUNTER — Ambulatory Visit (INDEPENDENT_AMBULATORY_CARE_PROVIDER_SITE_OTHER): Payer: Managed Care, Other (non HMO) | Admitting: Family Medicine

## 2019-12-11 ENCOUNTER — Encounter: Payer: Self-pay | Admitting: Family Medicine

## 2019-12-11 ENCOUNTER — Other Ambulatory Visit: Payer: Self-pay

## 2019-12-11 VITALS — BP 128/74 | HR 80 | Temp 98.1°F | Resp 14 | Ht 67.0 in | Wt 215.0 lb

## 2019-12-11 DIAGNOSIS — F411 Generalized anxiety disorder: Secondary | ICD-10-CM

## 2019-12-11 DIAGNOSIS — M8949 Other hypertrophic osteoarthropathy, multiple sites: Secondary | ICD-10-CM

## 2019-12-11 DIAGNOSIS — I10 Essential (primary) hypertension: Secondary | ICD-10-CM | POA: Diagnosis not present

## 2019-12-11 DIAGNOSIS — M159 Polyosteoarthritis, unspecified: Secondary | ICD-10-CM

## 2019-12-11 MED ORDER — ALPRAZOLAM 1 MG PO TABS: ORAL_TABLET | ORAL | 1 refills | Status: DC

## 2019-12-11 NOTE — Assessment & Plan Note (Signed)
Continue pain meds Ortho appt pending for hip

## 2019-12-11 NOTE — Patient Instructions (Addendum)
F/U April for Physical

## 2019-12-11 NOTE — Assessment & Plan Note (Addendum)
Controlled no changes She is medically cleared for surgery on wrist/hand and hip if needed

## 2019-12-11 NOTE — Assessment & Plan Note (Signed)
Prn xanax, does not need daily

## 2019-12-11 NOTE — Progress Notes (Signed)
° °  Subjective:    Patient ID: Lori Mullins, adult    DOB: Jul 24, 1965, 54 y.o.   MRN: [id number]  Patient presents for Follow-up (is not fasting)  Pt here to f/u chronic medical problems  Referred to ortho at the last visit due to chronic right hip pain  HTN- taking losartan as prescribed , due for renal function    GAD- taking xanax 1mg   BID prn   Chronic pain- she alternates ultram and norco for her chronic pain, she does not take with xanax  Also uses voltaren gel topically and occ flexeril for muscle spasm      Shingles vaccine #1 done last week    Dr.Weingold - scheduled for right wrist surgery    Review Of Systems:  GEN- denies fatigue, fever, weight loss,weakness, recent illness HEENT- denies eye drainage, change in vision, nasal discharge, CVS- denies chest pain, palpitations RESP- denies SOB, cough, wheeze ABD- denies N/V, change in stools, abd pain GU- denies dysuria, hematuria, dribbling, incontinence MSK- +joint pain, muscle aches, injury Neuro- denies headache, dizziness, syncope, seizure activity       Objective:    BP 128/74    Pulse 80    Temp 98.1 F (36.7 C) (Temporal)    Resp 14    Ht 5\' 7"  (1.702 m)    Wt 215 lb (97.5 kg)    LMP 03/01/2017    SpO2 98%    BMI 33.67 kg/m  GEN- NAD, alert and oriented x3 HEENT- PERRL, EOMI, non injected sclera, pink conjunctiva, MMM, oropharynx clear Neck- Supple, no thyromegaly CVS- RRR, no murmur RESP-CTAB ABD-NABS,soft,NT,ND Psych - normal affect and mood  EXT- No edema Pulses- Radial, DP- 2+        Assessment & Plan:      Problem List Items Addressed This Visit      Unprioritized   Essential hypertension, benign - Primary    Controlled no changes She is medically cleared for surgery on wrist/hand and hip if needed       Relevant Orders   CBC with Differential/Platelet   Comprehensive metabolic panel   GAD (generalized anxiety disorder)    Prn xanax, does not need daily       Relevant  Medications   ALPRAZolam (XANAX) 1 MG tablet   OA (osteoarthritis)    Continue pain meds Ortho appt pending for hip          Note: This dictation was prepared with Dragon dictation along with smaller phrase technology. Any transcriptional errors that result from this process are unintentional.

## 2019-12-12 ENCOUNTER — Encounter: Payer: Self-pay | Admitting: Family Medicine

## 2019-12-12 LAB — COMPREHENSIVE METABOLIC PANEL
AG Ratio: 1.7 (calc) (ref 1.0–2.5)
ALT: 13 U/L (ref 6–29)
AST: 18 U/L (ref 10–35)
Albumin: 4.1 g/dL (ref 3.6–5.1)
Alkaline phosphatase (APISO): 98 U/L (ref 37–153)
BUN: 12 mg/dL (ref 7–25)
CO2: 31 mmol/L (ref 20–32)
Calcium: 9.7 mg/dL (ref 8.6–10.4)
Chloride: 102 mmol/L (ref 98–110)
Creat: 0.79 mg/dL (ref 0.50–1.05)
Globulin: 2.4 g/dL (calc) (ref 1.9–3.7)
Glucose, Bld: 84 mg/dL (ref 65–99)
Potassium: 4.7 mmol/L (ref 3.5–5.3)
Sodium: 139 mmol/L (ref 135–146)
Total Bilirubin: 0.3 mg/dL (ref 0.2–1.2)
Total Protein: 6.5 g/dL (ref 6.1–8.1)

## 2019-12-12 LAB — CBC WITH DIFFERENTIAL/PLATELET
Absolute Monocytes: 599 cells/uL (ref 200–950)
Basophils Absolute: 51 cells/uL (ref 0–200)
Basophils Relative: 0.7 %
Eosinophils Absolute: 248 cells/uL (ref 15–500)
Eosinophils Relative: 3.4 %
HCT: 43.4 % (ref 35.0–45.0)
Hemoglobin: 14.6 g/dL (ref 11.7–15.5)
Lymphs Abs: 2796 cells/uL (ref 850–3900)
MCH: 30.7 pg (ref 27.0–33.0)
MCHC: 33.6 g/dL (ref 32.0–36.0)
MCV: 91.2 fL (ref 80.0–100.0)
MPV: 10.5 fL (ref 7.5–12.5)
Monocytes Relative: 8.2 %
Neutro Abs: 3606 cells/uL (ref 1500–7800)
Neutrophils Relative %: 49.4 %
Platelets: 262 10*3/uL (ref 140–400)
RBC: 4.76 10*6/uL (ref 3.80–5.10)
RDW: 11.9 % (ref 11.0–15.0)
Total Lymphocyte: 38.3 %
WBC: 7.3 10*3/uL (ref 3.8–10.8)

## 2019-12-18 ENCOUNTER — Other Ambulatory Visit: Payer: Self-pay | Admitting: Family Medicine

## 2019-12-18 MED ORDER — HYDROCODONE-ACETAMINOPHEN 5-325 MG PO TABS: 1.0000 | ORAL_TABLET | Freq: Four times a day (QID) | ORAL | 0 refills | Status: DC | PRN

## 2019-12-18 NOTE — Telephone Encounter (Signed)
Ok to refill??  Last office visit 12/11/2019.  Last refill 11/17/2019.

## 2019-12-20 DIAGNOSIS — M1611 Unilateral primary osteoarthritis, right hip: Secondary | ICD-10-CM | POA: Insufficient documentation

## 2020-01-17 ENCOUNTER — Other Ambulatory Visit: Payer: Self-pay | Admitting: Family Medicine

## 2020-01-17 ENCOUNTER — Encounter: Payer: Self-pay | Admitting: Family Medicine

## 2020-01-17 MED ORDER — HYDROCODONE-ACETAMINOPHEN 5-325 MG PO TABS: 1.0000 | ORAL_TABLET | Freq: Four times a day (QID) | ORAL | 0 refills | Status: DC | PRN

## 2020-01-17 MED ORDER — CYCLOBENZAPRINE HCL 10 MG PO TABS: ORAL_TABLET | ORAL | 1 refills | Status: DC

## 2020-01-17 NOTE — Telephone Encounter (Signed)
Please Advise

## 2020-01-18 ENCOUNTER — Encounter: Payer: Self-pay | Admitting: Family Medicine

## 2020-01-18 ENCOUNTER — Other Ambulatory Visit: Payer: Self-pay | Admitting: Family Medicine

## 2020-01-18 MED ORDER — TRAMADOL HCL 50 MG PO TABS: ORAL_TABLET | ORAL | 0 refills | Status: DC

## 2020-01-18 NOTE — Telephone Encounter (Signed)
Ok to refill??  Last office visit 12/11/2019.  Last refill 11/07/2019, #1 refill.

## 2020-02-18 ENCOUNTER — Other Ambulatory Visit: Payer: Self-pay | Admitting: Family Medicine

## 2020-02-20 ENCOUNTER — Encounter: Payer: Self-pay | Admitting: Family Medicine

## 2020-02-26 ENCOUNTER — Encounter: Payer: Self-pay | Admitting: Family Medicine

## 2020-02-26 MED ORDER — HYDROCODONE-ACETAMINOPHEN 5-325 MG PO TABS: 1.0000 | ORAL_TABLET | Freq: Four times a day (QID) | ORAL | 0 refills | Status: DC | PRN

## 2020-02-26 NOTE — Telephone Encounter (Signed)
Ok to refill??  Last office visit 12/11/2019.  Last refill 01/17/2020.

## 2020-03-22 ENCOUNTER — Other Ambulatory Visit: Payer: Self-pay | Admitting: Family Medicine

## 2020-03-22 NOTE — Telephone Encounter (Signed)
Ok to refill 

## 2020-03-27 ENCOUNTER — Encounter: Payer: Self-pay | Admitting: Family Medicine

## 2020-03-27 MED ORDER — HYDROCODONE-ACETAMINOPHEN 5-325 MG PO TABS
1.0000 | ORAL_TABLET | Freq: Four times a day (QID) | ORAL | 0 refills | Status: DC | PRN
Start: 2020-03-27 — End: 2020-05-30

## 2020-03-27 NOTE — Telephone Encounter (Signed)
Ok to refill??  Last office visit 12/11/2019.  Last refill 02/26/2020.

## 2020-04-19 ENCOUNTER — Encounter: Payer: Self-pay | Admitting: Family Medicine

## 2020-04-19 ENCOUNTER — Other Ambulatory Visit: Payer: Self-pay | Admitting: Family Medicine

## 2020-04-19 NOTE — Telephone Encounter (Signed)
Ok to refill??  Last office visit 12/11/2019.  Last refill 02/19/2020 on both.

## 2020-05-14 ENCOUNTER — Encounter: Payer: Self-pay | Admitting: Internal Medicine

## 2020-05-14 ENCOUNTER — Other Ambulatory Visit: Payer: Self-pay

## 2020-05-14 ENCOUNTER — Ambulatory Visit: Payer: Managed Care, Other (non HMO) | Admitting: Internal Medicine

## 2020-05-14 VITALS — BP 169/89 | HR 108 | Resp 18 | Ht 66.0 in | Wt 209.8 lb

## 2020-05-14 DIAGNOSIS — F411 Generalized anxiety disorder: Secondary | ICD-10-CM

## 2020-05-14 DIAGNOSIS — Z860100 Personal history of colon polyps, unspecified: Secondary | ICD-10-CM | POA: Insufficient documentation

## 2020-05-14 DIAGNOSIS — I10 Essential (primary) hypertension: Secondary | ICD-10-CM | POA: Diagnosis not present

## 2020-05-14 DIAGNOSIS — M8949 Other hypertrophic osteoarthropathy, multiple sites: Secondary | ICD-10-CM

## 2020-05-14 DIAGNOSIS — M159 Polyosteoarthritis, unspecified: Secondary | ICD-10-CM

## 2020-05-14 DIAGNOSIS — Z7689 Persons encountering health services in other specified circumstances: Secondary | ICD-10-CM | POA: Diagnosis not present

## 2020-05-14 DIAGNOSIS — G5603 Carpal tunnel syndrome, bilateral upper limbs: Secondary | ICD-10-CM

## 2020-05-14 NOTE — Patient Instructions (Addendum)
Please continue taking medications as prescribed.  Please follow DASH diet and perform moderate exercise/walking at least 150 mins/week.   [email or web address]">  DASH Eating Plan DASH stands for Dietary Approaches to Stop Hypertension. The DASH eating plan is a healthy eating plan that has been shown to:  Reduce high blood pressure (hypertension).  Reduce your risk for type 2 diabetes, heart disease, and stroke.  Help with weight loss. What are tips for following this plan? Reading food labels  Check food labels for the amount of salt (sodium) per serving. Choose foods with less than 5 percent of the Daily Value of sodium. Generally, foods with less than 300 milligrams (mg) of sodium per serving fit into this eating plan.  To find whole grains, look for the word "whole" as the first word in the ingredient list. Shopping  Buy products labeled as "low-sodium" or "no salt added."  Buy fresh foods. Avoid canned foods and pre-made or frozen meals. Cooking  Avoid adding salt when cooking. Use salt-free seasonings or herbs instead of table salt or sea salt. Check with your health care provider or pharmacist before using salt substitutes.  Do not fry foods. Cook foods using healthy methods such as baking, boiling, grilling, roasting, and broiling instead.  Cook with heart-healthy oils, such as olive, canola, avocado, soybean, or sunflower oil. Meal planning  Eat a balanced diet that includes: ? 4 or more servings of fruits and 4 or more servings of vegetables each day. Try to fill one-half of your plate with fruits and vegetables. ? 6-8 servings of whole grains each day. ? Less than 6 oz (170 g) of lean meat, poultry, or fish each day. A 3-oz (85-g) serving of meat is about the same size as a deck of cards. One egg equals 1 oz (28 g). ? 2-3 servings of low-fat dairy each day. One serving is 1 cup (237 mL). ? 1 serving of nuts, seeds, or  beans 5 times each week. ? 2-3 servings of heart-healthy fats. Healthy fats called omega-3 fatty acids are found in foods such as walnuts, flaxseeds, fortified milks, and eggs. These fats are also found in cold-water fish, such as sardines, salmon, and mackerel.  Limit how much you eat of: ? Canned or prepackaged foods. ? Food that is high in trans fat, such as some fried foods. ? Food that is high in saturated fat, such as fatty meat. ? Desserts and other sweets, sugary drinks, and other foods with added sugar. ? Full-fat dairy products.  Do not salt foods before eating.  Do not eat more than 4 egg yolks a week.  Try to eat at least 2 vegetarian meals a week.  Eat more home-cooked food and less restaurant, buffet, and fast food.   Lifestyle  When eating at a restaurant, ask that your food be prepared with less salt or no salt, if possible.  If you drink alcohol: ? Limit how much you use to:  0-1 drink a day for women who are not pregnant.  0-2 drinks a day for men. ? Be aware of how much alcohol is in your drink. In the [place] one drink equals one 12 oz bottle of beer (355 mL), one 5 oz glass of wine (148 mL), or one 1 oz glass of hard liquor (44 mL). General information  Avoid eating more than 2,300 mg of salt a day. If you have hypertension, you may need to reduce your sodium intake to 1,500 mg a day.  Work with  your health care provider to maintain a healthy body weight or to lose weight. Ask what an ideal weight is for you.  Get at least 30 minutes of exercise that causes your heart to beat faster (aerobic exercise) most days of the week. Activities may include walking, swimming, or biking.  Work with your health care provider or dietitian to adjust your eating plan to your individual calorie needs. What foods should I eat? Fruits All fresh, dried, or frozen fruit. Canned fruit in natural juice (without added sugar). Vegetables Fresh or frozen vegetables (raw, steamed,  roasted, or grilled). Low-sodium or reduced-sodium tomato and vegetable juice. Low-sodium or reduced-sodium tomato sauce and tomato paste. Low-sodium or reduced-sodium canned vegetables. Grains Whole-grain or whole-wheat bread. Whole-grain or whole-wheat pasta. Brown rice. [place]. Bulgur. Whole-grain and low-sodium cereals. Pita bread. Low-fat, low-sodium crackers. Whole-wheat flour tortillas. Meats and other proteins Skinless chicken or [place]. Ground chicken or [place]. Pork with fat trimmed off. Fish and seafood. Egg whites. Dried beans, peas, or lentils. Unsalted nuts, nut butters, and seeds. Unsalted canned beans. Lean cuts of beef with fat trimmed off. Low-sodium, lean precooked or cured meat, such as sausages or meat loaves. Dairy Low-fat (1%) or fat-free (skim) milk. Reduced-fat, low-fat, or fat-free cheeses. Nonfat, low-sodium ricotta or cottage cheese. Low-fat or nonfat yogurt. Low-fat, low-sodium cheese. Fats and oils Soft margarine without trans fats. Vegetable oil. Reduced-fat, low-fat, or light mayonnaise and salad dressings (reduced-sodium). Canola, safflower, olive, avocado, soybean, and sunflower oils. Avocado. Seasonings and condiments Herbs. Spices. Seasoning mixes without salt. Other foods Unsalted popcorn and pretzels. Fat-free sweets. The items listed above may not be a complete list of foods and beverages you can eat. Contact a dietitian for more information. What foods should I avoid? Fruits Canned fruit in a light or heavy syrup. Fried fruit. Fruit in cream or butter sauce. Vegetables Creamed or fried vegetables. Vegetables in a cheese sauce. Regular canned vegetables (not low-sodium or reduced-sodium). Regular canned tomato sauce and paste (not low-sodium or reduced-sodium). Regular tomato and vegetable juice (not low-sodium or reduced-sodium). Angie Fava. Olives. Grains Baked goods made with fat, such as croissants, muffins, or some breads. Dry pasta or rice meal  packs. Meats and other proteins Fatty cuts of meat. Ribs. Fried meat. Berniece Salines. Bologna, salami, and other precooked or cured meats, such as sausages or meat loaves. Fat from the back of a pig (fatback). Bratwurst. Salted nuts and seeds. Canned beans with added salt. Canned or smoked fish. Whole eggs or egg yolks. Chicken or [place] with skin. Dairy Whole or 2% milk, cream, and half-and-half. Whole or full-fat cream cheese. Whole-fat or sweetened yogurt. Full-fat cheese. Nondairy creamers. Whipped toppings. Processed cheese and cheese spreads. Fats and oils Butter. Stick margarine. Lard. Shortening. Ghee. Bacon fat. Tropical oils, such as coconut, palm kernel, or palm oil. Seasonings and condiments Onion salt, garlic salt, seasoned salt, table salt, and sea salt. Worcestershire sauce. Tartar sauce. Barbecue sauce. Teriyaki sauce. Soy sauce, including reduced-sodium. Steak sauce. Canned and packaged gravies. Fish sauce. Oyster sauce. Cocktail sauce. Store-bought horseradish. Ketchup. Mustard. Meat flavorings and tenderizers. Bouillon cubes. Hot sauces. Pre-made or packaged marinades. Pre-made or packaged taco seasonings. Relishes. Regular salad dressings. Other foods Salted popcorn and pretzels. The items listed above may not be a complete list of foods and beverages you should avoid. Contact a dietitian for more information. Where to find more information  National Heart, Lung, and Blood Institute: https://wilson-eaton.com/  American Heart Association: www.heart.org  Academy of Nutrition and Dietetics: www.eatright.org  [place]: www.kidney.org Summary  The DASH eating plan is a healthy eating plan that has been shown to reduce high blood pressure (hypertension). It may also reduce your risk for type 2 diabetes, heart disease, and stroke.  When on the DASH eating plan, aim to eat more fresh fruits and vegetables, whole grains, lean proteins, low-fat dairy, and heart-healthy  fats.  With the DASH eating plan, you should limit salt (sodium) intake to 2,300 mg a day. If you have hypertension, you may need to reduce your sodium intake to 1,500 mg a day.  Work with your health care provider or dietitian to adjust your eating plan to your individual calorie needs. This information is not intended to replace advice given to you by your health care provider. Make sure you discuss any questions you have with your health care provider. Document Revised: 01/20/2019 Document Reviewed: 01/20/2019 Elsevier Patient Education  2021 [employer].

## 2020-05-14 NOTE — Assessment & Plan Note (Signed)
Gets PRP injections, follows up with Orthopedic surgery Tramadol 50 mg QD PRN Norco PRN, she takes it only on weekends.

## 2020-05-14 NOTE — Progress Notes (Signed)
New Patient Office Visit  Subjective:  Patient ID: Lori Mullins, adult    DOB: [birth year]  Age: 55 y.o. MRN: [id number]  CC:  Chief Complaint  Patient presents with  . New Patient (Initial Visit)    New patient establishing care     HPI Lori Mullins is a 55 year old female with PMH of HTN, OA, GAD, colonic polyps and obesity who presents for establishing care. She is a former patient of Dr Buelah Manis.  Her BP was elevated today on multiple measurements. She appears to be anxious as this is her first visit. She has been taking Losartan 50 mg QD. She denies any headache, dizziness, chest pain, dyspnea or palpitations. She has joined exercise group recently.  She has h/o anxiety, for which she takes Xanax. Denies anhedonia, SI or HI.  She has h/o OA of right hip and b/l carpal tunnel syndrome, for which she follows up with Orthopedic surgeon.  She is up-to-date with COVID vaccine.    Past Medical History:  Diagnosis Date  . Anxiety   . Arthritis    Hip/Hands (right)  . Bilateral carpal tunnel syndrome 11/29/2018  . Campylobacter intestinal infection 1996   Became systemic  . Cellulitis    occular  . Hypertension   . OCD (obsessive compulsive disorder) 2007  . Plantar fasciitis    seen by orthoStevie Kern    Past Surgical History:  Procedure Laterality Date  . CARPAL TUNNEL RELEASE Left 03/29/2019   Procedure: CARPAL TUNNEL RELEASE ENDOSCOPIC;  Surgeon: Corky Mull, MD;  Location: [facility];  Service: Orthopedics;  Laterality: Left;  . CARPAL TUNNEL RELEASE Right 05/31/2019   Procedure: CARPAL TUNNEL RELEASE ENDOSCOPIC;  Surgeon: Corky Mull, MD;  Location: [facility];  Service: Orthopedics;  Laterality: Right;  . CERVICAL DISCECTOMY     C4-C5  . COLONOSCOPY WITH PROPOFOL N/A 05/15/2019   Procedure: COLONOSCOPY WITH PROPOFOL;  Surgeon: Daneil Dolin, MD;  Location: [facility];  Service: Endoscopy;  Laterality: N/A;  11:15am  . EYE SURGERY N/A    Phreesia  05/14/2020  . POLYPECTOMY  05/15/2019   Procedure: POLYPECTOMY;  Surgeon: Daneil Dolin, MD;  Location: [facility];  Service: Endoscopy;;  . RETINAL DETACHMENT SURGERY Left 2005  . SPINE SURGERY N/A    Phreesia 05/14/2020    Family History  Problem Relation Age of Onset  . Hypertension Father   . Colon cancer Neg Hx   . Colon polyps Neg Hx     Social History   Socioeconomic History  . Marital status: Married    Spouse name: Not on file  . Number of children: Not on file  . Years of education: Not on file  . Highest education level: Not on file  Occupational History  . Not on file  Tobacco Use  . Smoking status: Former Smoker    Packs/day: 2.00    Years: 20.00    Pack years: 40.00    Types: Cigarettes    Quit date: 2010    Years since quitting: 12.2  . Smokeless tobacco: Never Used  Vaping Use  . Vaping Use: Never used  Substance and Sexual Activity  . Alcohol use: Yes    Alcohol/week: 7.0 standard drinks    Types: 7 Standard drinks or equivalent per week    Comment: occasionally  . Drug use: Yes    Types: Marijuana  . Sexual activity: Yes  Other Topics Concern  . Not on file  Social  History Narrative  . Not on file   Social Determinants of Health   Financial Resource Strain: Not on file  Food Insecurity: Not on file  Transportation Needs: Not on file  Physical Activity: Not on file  Stress: Not on file  Social Connections: Not on file  Intimate Partner Violence: Not on file    ROS Review of Systems  Constitutional: Negative for chills and fever.  HENT: Negative for congestion, sinus pressure, sinus pain and sore throat.   Eyes: Negative for pain and discharge.  Respiratory: Negative for cough and shortness of breath.   Cardiovascular: Negative for chest pain and palpitations.  Gastrointestinal: Negative for abdominal pain, constipation, diarrhea, nausea and vomiting.  Endocrine: Negative for polydipsia and polyuria.  Genitourinary: Negative for  dysuria and hematuria.  Musculoskeletal: Positive for arthralgias. Negative for neck pain and neck stiffness.  Skin: Negative for rash.  Neurological: Negative for dizziness and weakness.  Psychiatric/Behavioral: Negative for agitation, behavioral problems and suicidal ideas. The patient is nervous/anxious.     Objective:   Today's Vitals: BP (!) 169/89 (BP Location: Right Arm, Patient Position: Sitting, Cuff Size: Normal)   Pulse (!) 108   Resp 18   Ht 5\' 6"  (1.676 m)   Wt 209 lb 12.8 oz (95.2 kg)   LMP 03/01/2017   SpO2 97%   BMI 33.86 kg/m   Physical Exam Vitals reviewed.  Constitutional:      General: She is not in acute distress.    Appearance: She is not diaphoretic.  HENT:     Head: Normocephalic and atraumatic.     Nose: Nose normal.     Mouth/Throat:     Mouth: Mucous membranes are moist.  Eyes:     General: No scleral icterus.    Extraocular Movements: Extraocular movements intact.     Pupils: Pupils are equal, round, and reactive to light.  Cardiovascular:     Rate and Rhythm: Normal rate and regular rhythm.     Pulses: Normal pulses.     Heart sounds: Normal heart sounds. No murmur heard.   Pulmonary:     Breath sounds: Normal breath sounds. No wheezing or rales.  Musculoskeletal:     Cervical back: Neck supple. No tenderness.     Right lower leg: No edema.     Left lower leg: No edema.  Skin:    General: Skin is warm.     Findings: No rash.  Neurological:     General: No focal deficit present.     Mental Status: She is alert and oriented to person, place, and time.  Psychiatric:        Mood and Affect: Mood normal.        Behavior: Behavior normal.     Assessment & Plan:   Problem List Items Addressed This Visit      Encounter to establish care - Primary  Care established Previous chart reviewed History and medications reviewed with the patient  Cardiovascular and Mediastinum   Essential hypertension, benign    BP Readings from Last 1  Encounters:  05/14/20 (!) 169/89   Uncontrolled with Losartan 50 mg QD Patient appears to be anxious today, but previous BP readings during previous PCP visits have been wnl. Counseled for compliance with the medications Advised DASH diet and moderate exercise/walking, at least 150 mins/week Patient to send home BP readings through MyChart.        Nervous and Auditory   Bilateral carpal tunnel syndrome    S/p  carpal tunnel release b/l Follows up with Orthopedic surgery        Musculoskeletal and Integument   OA (osteoarthritis)    Gets PRP injections, follows up with Orthopedic surgery Tramadol 50 mg QD PRN Norco PRN, she takes it only on weekends.        Other   GAD (generalized anxiety disorder)    On Xanax 1 mg BID Has tried SSRI in the past      Hx of colonic polyp    Last colonoscopy in 2021, needs a repeat colonoscopy after 3 years.            Outpatient Encounter Medications as of 05/14/2020  Medication Sig  . acetaminophen (TYLENOL) 325 MG tablet Take 650 mg by mouth every 6 (six) hours as needed.  . ALPRAZolam (XANAX) 1 MG tablet TAKE 1 TABLET(1 MG) BY MOUTH TWICE DAILY AS NEEDED  . CANNABIDIOL PO Take 1 capsule by mouth daily. Full spectrum CBD Supplement  . cyclobenzaprine (FLEXERIL) 10 MG tablet TAKE 1 TABLET(10 MG) BY MOUTH THREE TIMES DAILY AS NEEDED FOR MUSCLE SPASMS  . diclofenac Sodium (VOLTAREN) 1 % GEL Apply 2 g topically 4 (four) times daily as needed. (Patient taking differently: Apply 2 g topically in the morning, at noon, and at bedtime.)  . fexofenadine (ALLEGRA) 180 MG tablet Take 180 mg by mouth daily.  . Glycerin-Hypromellose-PEG 400 (DRY EYE RELIEF DROPS) 0.2-0.2-1 % SOLN Place 1 drop into both eyes 3 (three) times daily as needed (DRY/IRRITATED EYES.).  [name] HYDROcodone-acetaminophen (NORCO/VICODIN) 5-325 MG tablet Take 1 tablet by mouth every 6 (six) hours as needed (pain.).  [name] losartan (COZAAR) 100 MG tablet TAKE ONE-HALF (1/2) TABLET DAILY   . Multiple Vitamin (MULTIVITAMIN WITH MINERALS) TABS tablet Take 1 tablet by mouth daily.  . traMADol (ULTRAM) 50 MG tablet TAKE 1 TABLET BY MOUTH EVERY 12 HOURS AS NEEDED   No facility-administered encounter medications on file as of 05/14/2020.    Follow-up: Return in about 6 weeks (around 06/25/2020) for HTN.   Lindell Spar, MD

## 2020-05-14 NOTE — Assessment & Plan Note (Signed)
On Xanax 1 mg BID Has tried SSRI in the past

## 2020-05-14 NOTE — Assessment & Plan Note (Signed)
Last colonoscopy in 2021, needs a repeat colonoscopy after 3 years.

## 2020-05-14 NOTE — Assessment & Plan Note (Signed)
S/p carpal tunnel release b/l Follows up with Orthopedic surgery

## 2020-05-14 NOTE — Assessment & Plan Note (Signed)
BP Readings from Last 1 Encounters:  05/14/20 (!) 169/89   Uncontrolled with Losartan 50 mg QD Patient appears to be anxious today, but previous BP readings during previous PCP visits have been wnl. Counseled for compliance with the medications Advised DASH diet and moderate exercise/walking, at least 150 mins/week

## 2020-05-17 ENCOUNTER — Encounter: Payer: Self-pay | Admitting: Internal Medicine

## 2020-05-20 ENCOUNTER — Other Ambulatory Visit: Payer: Self-pay | Admitting: Internal Medicine

## 2020-05-20 DIAGNOSIS — F411 Generalized anxiety disorder: Secondary | ICD-10-CM

## 2020-05-20 DIAGNOSIS — M1611 Unilateral primary osteoarthritis, right hip: Secondary | ICD-10-CM

## 2020-05-20 MED ORDER — ALPRAZOLAM 1 MG PO TABS: 1.0000 mg | ORAL_TABLET | Freq: Two times a day (BID) | ORAL | 2 refills | Status: DC | PRN

## 2020-05-20 MED ORDER — TRAMADOL HCL 50 MG PO TABS
50.0000 mg | ORAL_TABLET | Freq: Two times a day (BID) | ORAL | 2 refills | Status: DC | PRN
Start: 2020-05-20 — End: 2020-08-23

## 2020-05-20 MED ORDER — CYCLOBENZAPRINE HCL 10 MG PO TABS
10.0000 mg | ORAL_TABLET | Freq: Every evening | ORAL | 1 refills | Status: DC | PRN
Start: 2020-05-20 — End: 2020-08-23

## 2020-05-29 ENCOUNTER — Encounter: Payer: Self-pay | Admitting: Internal Medicine

## 2020-05-30 ENCOUNTER — Other Ambulatory Visit: Payer: Self-pay | Admitting: Internal Medicine

## 2020-05-30 DIAGNOSIS — M1611 Unilateral primary osteoarthritis, right hip: Secondary | ICD-10-CM

## 2020-05-30 MED ORDER — HYDROCODONE-ACETAMINOPHEN 5-325 MG PO TABS: 1.0000 | ORAL_TABLET | Freq: Four times a day (QID) | ORAL | 0 refills | Status: DC | PRN

## 2020-05-30 NOTE — Telephone Encounter (Signed)
Were you refilling this medication? Please advise

## 2020-05-30 NOTE — Telephone Encounter (Signed)
Would be refilled and sent per dr patel

## 2020-06-25 ENCOUNTER — Ambulatory Visit: Payer: Managed Care, Other (non HMO) | Admitting: Internal Medicine

## 2020-06-26 ENCOUNTER — Other Ambulatory Visit: Payer: Self-pay

## 2020-06-26 ENCOUNTER — Encounter: Payer: Self-pay | Admitting: Internal Medicine

## 2020-06-26 ENCOUNTER — Ambulatory Visit: Payer: Managed Care, Other (non HMO) | Admitting: Internal Medicine

## 2020-06-26 VITALS — BP 144/92 | HR 104 | Temp 98.4°F | Resp 18 | Ht 67.0 in | Wt 211.0 lb

## 2020-06-26 DIAGNOSIS — M1611 Unilateral primary osteoarthritis, right hip: Secondary | ICD-10-CM

## 2020-06-26 DIAGNOSIS — M8949 Other hypertrophic osteoarthropathy, multiple sites: Secondary | ICD-10-CM | POA: Diagnosis not present

## 2020-06-26 DIAGNOSIS — Z131 Encounter for screening for diabetes mellitus: Secondary | ICD-10-CM

## 2020-06-26 DIAGNOSIS — I10 Essential (primary) hypertension: Secondary | ICD-10-CM

## 2020-06-26 DIAGNOSIS — E782 Mixed hyperlipidemia: Secondary | ICD-10-CM

## 2020-06-26 DIAGNOSIS — F411 Generalized anxiety disorder: Secondary | ICD-10-CM

## 2020-06-26 DIAGNOSIS — M159 Polyosteoarthritis, unspecified: Secondary | ICD-10-CM

## 2020-06-26 DIAGNOSIS — E559 Vitamin D deficiency, unspecified: Secondary | ICD-10-CM

## 2020-06-26 MED ORDER — HYDROCODONE-ACETAMINOPHEN 5-325 MG PO TABS: 1.0000 | ORAL_TABLET | Freq: Four times a day (QID) | ORAL | 0 refills | Status: DC | PRN

## 2020-06-26 MED ORDER — LOSARTAN POTASSIUM 100 MG PO TABS: 100.0000 mg | ORAL_TABLET | Freq: Every day | ORAL | 1 refills | Status: DC

## 2020-06-26 NOTE — Patient Instructions (Signed)
Please start taking Losartan 100 mg once daily.  Please continue taking other medications as prescribed.  Please continue to follow low salt diet and perform moderate exercise/walking at least 150 mins/week.

## 2020-06-26 NOTE — Assessment & Plan Note (Signed)
BP Readings from Last 1 Encounters:  06/26/20 (!) 144/92   uncontrolled with Losartan 50 mg QD Increased Losartan to 100 mg QD Counseled for compliance with the medications Advised DASH diet and moderate exercise/walking as tolerated

## 2020-06-26 NOTE — Progress Notes (Signed)
Established Patient Office Visit  Subjective:  Patient ID: Lori Mullins, adult    DOB: 22-Dec-1965  Age: 55 y.o. MRN: [id number]  CC:  Chief Complaint  Patient presents with  . Follow-up    6 week follow up pt has been having right hip pain really bad due to arthritis     HPI Lori Mullins is a 55 year old female with PMH of HTN, OA, GAD, colonic polyps and obesity who presents for follow up of HTN.  BP is uncontrolled. Takes medications regularly. Patient denies headache, dizziness, chest pain, dyspnea or palpitations.  She has been having severe hip pain, for which she takes Tramadol PRN. She takes Norco on weekends. She follows up with Orthopedic surgeon and thinks that she would opt to get hip replacement soon.   Past Medical History:  Diagnosis Date  . Anxiety   . Arthritis    Hip/Hands (right)  . Bilateral carpal tunnel syndrome 11/29/2018  . Campylobacter intestinal infection 1996   Became systemic  . Cellulitis    occular  . Hypertension   . OCD (obsessive compulsive disorder) 2007  . Plantar fasciitis    seen by orthoStevie Kern    Past Surgical History:  Procedure Laterality Date  . CARPAL TUNNEL RELEASE Left 03/29/2019   Procedure: CARPAL TUNNEL RELEASE ENDOSCOPIC;  Surgeon: Corky Mull, MD;  Location: [place];  Service: Orthopedics;  Laterality: Left;  . CARPAL TUNNEL RELEASE Right 05/31/2019   Procedure: CARPAL TUNNEL RELEASE ENDOSCOPIC;  Surgeon: Corky Mull, MD;  Location: [facility];  Service: Orthopedics;  Laterality: Right;  . CERVICAL DISCECTOMY     C4-C5  . COLONOSCOPY WITH PROPOFOL N/A 05/15/2019   Procedure: COLONOSCOPY WITH PROPOFOL;  Surgeon: Daneil Dolin, MD;  Location: [facility];  Service: Endoscopy;  Laterality: N/A;  11:15am  . EYE SURGERY N/A    [employer]reesia 05/14/2020  . POLYPECTOMY  05/15/2019   Procedure: POLYPECTOMY;  Surgeon: Daneil Dolin, MD;  Location: [facility];  Service: Endoscopy;;  . RETINAL DETACHMENT  SURGERY Left 2005  . SPINE SURGERY N/A    [employer]rees[employer] 05/14/2020    Family History  Problem Relation Age of Onset  . Hypertension Father   . Colon cancer Neg Hx   . Colon polyps Neg Hx     Social History   Socioeconomic History  . Marital status: Married    Spouse name: Not on file  . Number of children: Not on file  . Years of education: Not on file  . Highest education level: Not on file  Occupational History  . Not on file  Tobacco Use  . Smoking status: Former Smoker    Packs/day: 2.00    Years: 20.00    Pack years: 40.00    Types: Cigarettes    Quit date: 2010    Years since quitting: 12.3  . Smokeless tobacco: Never Used  Vaping Use  . Vaping Use: Never used  Substance and Sexual Activity  . Alcohol use: Yes    Alcohol/week: 7.0 standard drinks    Types: 7 Standard drinks or equivalent per week    Comment: occasionally  . Drug use: Yes    Types: Marijuana  . Sexual activity: Yes  Other Topics Concern  . Not on file  Social History Narrative  . Not on file   Social Determinants of Health   Financial Resource Strain: Not on file  Food Insecurity: Not on file  Transportation Needs: Not on file  Physical Activity: Not on file  Stress: Not on file  Social Connections: Not on file  Intimate Partner Violence: Not on file    Outpatient Medications Prior to Visit  Medication Sig Dispense Refill  . acetaminophen (TYLENOL) 325 MG tablet Take 650 mg by mouth every 6 (six) hours as needed.    . ALPRAZolam (XANAX) 1 MG tablet Take 1 tablet (1 mg total) by mouth 2 (two) times daily as needed for anxiety. 60 tablet 2  . CANNABIDIOL PO Take 1 capsule by mouth daily. Full spectrum CBD Supplement    . cyclobenzaprine (FLEXERIL) 10 MG tablet Take 1 tablet (10 mg total) by mouth at bedtime as needed for muscle spasms. 45 tablet 1  . diclofenac Sodium (VOLTAREN) 1 % GEL Apply 2 g topically 4 (four) times daily as needed. (Patient taking differently: Apply 2 g topically in  the morning, at noon, and at bedtime.) 300 g 3  . fexofenadine (ALLEGRA) 180 MG tablet Take 180 mg by mouth daily.    . Glycerin-Hypromellose-PEG 400 (DRY EYE RELIEF DROPS) 0.2-0.2-1 % SOLN Place 1 drop into both eyes 3 (three) times daily as needed (DRY/IRRITATED EYES.).    [name] Multiple Vitamin (MULTIVITAMIN WITH MINERALS) TABS tablet Take 1 tablet by mouth daily.    . traMADol (ULTRAM) 50 MG tablet Take 1 tablet (50 mg total) by mouth every 12 (twelve) hours as needed. 60 tablet 2  . HYDROcodone-acetaminophen (NORCO/VICODIN) 5-325 MG tablet Take 1 tablet by mouth every 6 (six) hours as needed for severe pain (pain.). 30 tablet 0  . losartan (COZAAR) 100 MG tablet TAKE ONE-HALF (1/2) TABLET DAILY 45 tablet 3   No facility-administered medications prior to visit.    Allergies  Allergen Reactions  . Milk-Related Compounds Anaphylaxis    ROS Review of Systems  Constitutional: Negative for chills and fever.  HENT: Negative for congestion, sinus pressure, sinus pain and sore throat.   Eyes: Negative for pain and discharge.  Respiratory: Negative for cough and shortness of breath.   Cardiovascular: Negative for chest pain and palpitations.  Gastrointestinal: Negative for abdominal pain, constipation, diarrhea, nausea and vomiting.  Endocrine: Negative for polydipsia and polyuria.  Genitourinary: Negative for dysuria and hematuria.  Musculoskeletal: Positive for arthralgias. Negative for neck pain and neck stiffness.  Skin: Negative for rash.  Neurological: Negative for dizziness, weakness, numbness and headaches.  Psychiatric/Behavioral: Negative for agitation, behavioral problems and suicidal ideas. The patient is nervous/anxious.       Objective:    Physical Exam Vitals reviewed.  Constitutional:      General: She is not in acute distress.    Appearance: She is not diaphoretic.  HENT:     Head: Normocephalic and atraumatic.     Nose: Nose normal.     Mouth/Throat:     Mouth:  Mucous membranes are moist.  Eyes:     General: No scleral icterus.    Extraocular Movements: Extraocular movements intact.     Pupils: Pupils are equal, round, and reactive to light.  Cardiovascular:     Rate and Rhythm: Normal rate and regular rhythm.     Pulses: Normal pulses.     Heart sounds: Normal heart sounds. No murmur heard.   Pulmonary:     Breath sounds: Normal breath sounds. No wheezing or rales.  Musculoskeletal:     Cervical back: Neck supple. No tenderness.     Right lower leg: No edema.     Left lower leg: No edema.  Skin:  General: Skin is warm.     Findings: No rash.  Neurological:     General: No focal deficit present.     Mental Status: She is alert and oriented to person, place, and time.  Psychiatric:        Mood and Affect: Mood normal.        Behavior: Behavior normal.     BP (!) 144/92 (BP Location: Left Arm, Cuff Size: Normal)   Pulse (!) 104   Temp 98.4 F (36.9 C) (Oral)   Resp 18   Ht 5' 7" (1.702 m)   Wt 211 lb (95.7 kg)   LMP 03/01/2017   SpO2 98%   BMI 33.05 kg/m  Wt Readings from Last 3 Encounters:  06/26/20 211 lb (95.7 kg)  05/14/20 209 lb 12.8 oz (95.2 kg)  12/11/19 215 lb (97.5 kg)     Health Maintenance Due  Topic Date Due  . COVID-19 Vaccine (3 - Booster for Moderna series) 12/07/2019    There are no preventive care reminders to display for this patient.  Lab Results  Component Value Date   TSH 1.13 12/18/2015   Lab Results  Component Value Date   WBC 7.3 12/11/2019   HGB 14.6 12/11/2019   HCT 43.4 12/11/2019   MCV 91.2 12/11/2019   PLT 262 12/11/2019   Lab Results  Component Value Date   NA 139 12/11/2019   K 4.7 12/11/2019   CO2 31 12/11/2019   GLUCOSE 84 12/11/2019   BUN 12 12/11/2019   CREATININE 0.79 12/11/2019   BILITOT 0.3 12/11/2019   ALKPHOS 84 12/18/2015   AST 18 12/11/2019   ALT 13 12/11/2019   PROT 6.5 12/11/2019   ALBUMIN 4.5 12/18/2015   CALCIUM 9.7 12/11/2019   Lab Results   Component Value Date   CHOL 161 02/17/2019   Lab Results  Component Value Date   HDL 68 02/17/2019   Lab Results  Component Value Date   LDLCALC 77 02/17/2019   Lab Results  Component Value Date   TRIG 76 02/17/2019   Lab Results  Component Value Date   CHOLHDL 2.4 02/17/2019   Lab Results  Component Value Date   HGBA1C 5.0 02/17/2019      Assessment & Plan:   Problem List Items Addressed This Visit      Cardiovascular and Mediastinum   Essential hypertension, benign - Primary    BP Readings from Last 1 Encounters:  06/26/20 (!) 144/92   uncontrolled with Losartan 50 mg QD Increased Losartan to 100 mg QD Counseled for compliance with the medications Advised DASH diet and moderate exercise/walking as tolerated      Relevant Medications   losartan (COZAAR) 100 MG tablet   Other Relevant Orders   CBC with Differential/Platelet   CMP14+EGFR   TSH     Musculoskeletal and Integument   OA (osteoarthritis)    Gets PRP injections, follows up with Orthopedic surgery Tramadol 50 mg QD PRN Norco PRN, she takes it only on weekends. - refilled after reviewing PDMP      Relevant Medications   HYDROcodone-acetaminophen (NORCO/VICODIN) 5-325 MG tablet   Osteoarthritis of right hip   Relevant Medications   HYDROcodone-acetaminophen (NORCO/VICODIN) 5-325 MG tablet     Other   GAD (generalized anxiety disorder)    On Xanax 1 mg BID Has tried SSRI in the past       Other Visit Diagnoses    Mixed hyperlipidemia       Relevant Medications  losartan (COZAAR) 100 MG tablet   Other Relevant Orders   Lipid panel   Screening for diabetes mellitus (DM)       Relevant Orders   Hemoglobin A1c   Vitamin D deficiency       Relevant Orders   Vitamin D (25 hydroxy)      Meds ordered this encounter  Medications  . losartan (COZAAR) 100 MG tablet    Sig: Take 1 tablet (100 mg total) by mouth daily.    Dispense:  90 tablet    Refill:  1  .  HYDROcodone-acetaminophen (NORCO/VICODIN) 5-325 MG tablet    Sig: Take 1 tablet by mouth every 6 (six) hours as needed for severe pain (pain.).    Dispense:  60 tablet    Refill:  0    Chronic Pain. Dx: G89.4    Follow-up: Return in about 4 months (around 10/26/2020) for Annual physical.    Lindell Spar, MD

## 2020-06-26 NOTE — Assessment & Plan Note (Signed)
On Xanax 1 mg BID Has tried SSRI in the past 

## 2020-06-26 NOTE — Assessment & Plan Note (Signed)
Gets PRP injections, follows up with Orthopedic surgery Tramadol 50 mg QD PRN Norco PRN, she takes it only on weekends. - refilled after reviewing PDMP

## 2020-08-02 DIAGNOSIS — M25551 Pain in right hip: Secondary | ICD-10-CM | POA: Insufficient documentation

## 2020-08-04 ENCOUNTER — Encounter: Payer: Self-pay | Admitting: Internal Medicine

## 2020-08-05 ENCOUNTER — Other Ambulatory Visit: Payer: Self-pay | Admitting: *Deleted

## 2020-08-05 DIAGNOSIS — I10 Essential (primary) hypertension: Secondary | ICD-10-CM

## 2020-08-05 MED ORDER — LOSARTAN POTASSIUM 100 MG PO TABS: 100.0000 mg | ORAL_TABLET | Freq: Every day | ORAL | 1 refills | Status: DC

## 2020-08-23 ENCOUNTER — Other Ambulatory Visit: Payer: Self-pay | Admitting: Internal Medicine

## 2020-08-23 ENCOUNTER — Encounter: Payer: Self-pay | Admitting: Internal Medicine

## 2020-08-23 DIAGNOSIS — M1611 Unilateral primary osteoarthritis, right hip: Secondary | ICD-10-CM

## 2020-08-26 ENCOUNTER — Other Ambulatory Visit: Payer: Self-pay

## 2020-08-26 ENCOUNTER — Telehealth: Payer: Self-pay

## 2020-08-26 ENCOUNTER — Other Ambulatory Visit: Payer: Self-pay | Admitting: Nurse Practitioner

## 2020-08-26 DIAGNOSIS — M1611 Unilateral primary osteoarthritis, right hip: Secondary | ICD-10-CM

## 2020-08-26 MED ORDER — TRAMADOL HCL 50 MG PO TABS: ORAL_TABLET | ORAL | 2 refills | Status: DC

## 2020-08-26 NOTE — Telephone Encounter (Signed)
I think it sent in. It didn't make me sign, so see if it went to a printer.

## 2020-08-26 NOTE — Telephone Encounter (Signed)
Please electronically refill Tramadol 50mg  for Dr. Posey Pronto

## 2020-08-28 ENCOUNTER — Telehealth: Payer: Self-pay

## 2020-08-28 ENCOUNTER — Other Ambulatory Visit: Payer: Self-pay | Admitting: Nurse Practitioner

## 2020-08-28 ENCOUNTER — Other Ambulatory Visit: Payer: Self-pay | Admitting: Internal Medicine

## 2020-08-28 DIAGNOSIS — M1611 Unilateral primary osteoarthritis, right hip: Secondary | ICD-10-CM

## 2020-08-28 MED ORDER — TRAMADOL HCL 50 MG PO TABS: ORAL_TABLET | ORAL | 2 refills | Status: DC

## 2020-08-28 NOTE — Telephone Encounter (Signed)
Requests tramadol refill.

## 2020-08-28 NOTE — Telephone Encounter (Signed)
I sent this in 2 days ago. Not sure why pharmacy didn't get it. So, I sent it again.

## 2020-09-09 ENCOUNTER — Encounter: Payer: Self-pay | Admitting: Internal Medicine

## 2020-09-09 ENCOUNTER — Other Ambulatory Visit: Payer: Self-pay | Admitting: Family Medicine

## 2020-09-09 ENCOUNTER — Other Ambulatory Visit: Payer: Self-pay | Admitting: Nurse Practitioner

## 2020-09-09 NOTE — Telephone Encounter (Signed)
We sent in tramadol for her not too long ago. I don't mix tramadol and hydrocodone since they are both narcotics.

## 2020-09-09 NOTE — Telephone Encounter (Signed)
I took hydrocodone off of her med list when I sent in tramadol refill last month. Lori Mullins can sent it in if he wants to, since he is more familiar with her.

## 2020-09-11 NOTE — Telephone Encounter (Signed)
FYI  Pt advised that this would need to come from Dr Posey Pronto. She wanted me to send a message to provider as he will fill it when he comes back on 09-30-20. Let her know I would send message but this would not be addressed until provider was back in office on 09-30-20

## 2020-09-25 ENCOUNTER — Other Ambulatory Visit: Payer: Self-pay | Admitting: Internal Medicine

## 2020-09-25 DIAGNOSIS — M1611 Unilateral primary osteoarthritis, right hip: Secondary | ICD-10-CM

## 2020-09-25 MED ORDER — HYDROCODONE-ACETAMINOPHEN 5-325 MG PO TABS: 1.0000 | ORAL_TABLET | Freq: Four times a day (QID) | ORAL | 0 refills | Status: DC | PRN

## 2020-09-26 NOTE — Telephone Encounter (Signed)
Patient made aware and will keep appt 10-29-20

## 2020-10-01 ENCOUNTER — Other Ambulatory Visit: Payer: Self-pay | Admitting: Internal Medicine

## 2020-10-01 ENCOUNTER — Encounter: Payer: Self-pay | Admitting: Internal Medicine

## 2020-10-01 DIAGNOSIS — F411 Generalized anxiety disorder: Secondary | ICD-10-CM

## 2020-10-01 MED ORDER — ALPRAZOLAM 1 MG PO TABS: 1.0000 mg | ORAL_TABLET | Freq: Two times a day (BID) | ORAL | 2 refills | Status: DC | PRN

## 2020-10-04 ENCOUNTER — Encounter: Payer: Self-pay | Admitting: Internal Medicine

## 2020-10-07 ENCOUNTER — Other Ambulatory Visit (HOSPITAL_COMMUNITY): Payer: Self-pay | Admitting: Internal Medicine

## 2020-10-07 DIAGNOSIS — Z1231 Encounter for screening mammogram for malignant neoplasm of breast: Secondary | ICD-10-CM

## 2020-10-07 NOTE — Telephone Encounter (Signed)
Only takes Tramadol.  If needed will accept referral out to pain management, but he only takes the Tramadol.

## 2020-10-14 ENCOUNTER — Other Ambulatory Visit: Payer: Self-pay

## 2020-10-14 ENCOUNTER — Ambulatory Visit (HOSPITAL_COMMUNITY)
Admission: RE | Admit: 2020-10-14 | Discharge: 2020-10-14 | Disposition: A | Discharge status: Home / Self Care | Payer: Managed Care, Other (non HMO) | Source: Ambulatory Visit | Attending: Internal Medicine | Admitting: Internal Medicine

## 2020-10-14 DIAGNOSIS — Z1231 Encounter for screening mammogram for malignant neoplasm of breast: Secondary | ICD-10-CM | POA: Diagnosis present

## 2020-10-21 ENCOUNTER — Encounter: Payer: Self-pay | Admitting: Internal Medicine

## 2020-10-25 LAB — LIPID PANEL
Chol/HDL Ratio: 2.8 ratio (ref 0.0–4.4)
Cholesterol, Total: 163 mg/dL (ref 100–199)
HDL: 59 mg/dL (ref >39)
LDL Chol Calc (NIH): 82 mg/dL (ref 0–99)
Triglycerides: 126 mg/dL (ref 0–149)
VLDL Cholesterol Cal: 22 mg/dL (ref 5–40)

## 2020-10-25 LAB — CBC WITH DIFFERENTIAL/PLATELET
Basophils Absolute: 0.1 10*3/uL (ref 0.0–0.2)
Basos: 1 %
EOS (ABSOLUTE): 0.2 10*3/uL (ref 0.0–0.4)
Eos: 3 %
Hematocrit: 44.7 % (ref 34.0–46.6)
Hemoglobin: 14.8 g/dL (ref 11.1–15.9)
Immature Grans (Abs): 0 10*3/uL (ref 0.0–0.1)
Immature Granulocytes: 0 %
Lymphocytes Absolute: 1.6 10*3/uL (ref 0.7–3.1)
Lymphs: 26 %
MCH: 30.4 pg (ref 26.6–33.0)
MCHC: 33.1 g/dL (ref 31.5–35.7)
MCV: 92 fL (ref 79–97)
Monocytes Absolute: 0.4 10*3/uL (ref 0.1–0.9)
Monocytes: 7 %
Neutrophils Absolute: 4.1 10*3/uL (ref 1.4–7.0)
Neutrophils: 63 %
Platelets: 282 10*3/uL (ref 150–450)
RBC: 4.87 x10E6/uL (ref 3.77–5.28)
RDW: 12.1 % (ref 11.7–15.4)
WBC: 6.4 10*3/uL (ref 3.4–10.8)

## 2020-10-25 LAB — CMP14+EGFR
ALT: 17 IU/L (ref 0–32)
AST: 20 IU/L (ref 0–40)
Albumin/Globulin Ratio: 2.6 — ABNORMAL HIGH (ref 1.2–2.2)
Albumin: 4.7 g/dL (ref 3.8–4.9)
Alkaline Phosphatase: 91 IU/L (ref 44–121)
BUN/Creatinine Ratio: 22 (ref 9–23)
BUN: 16 mg/dL (ref 6–24)
Bilirubin Total: 0.3 mg/dL (ref 0.0–1.2)
CO2: 25 mmol/L (ref 20–29)
Calcium: 10.1 mg/dL (ref 8.7–10.2)
Chloride: 102 mmol/L (ref 96–106)
Creatinine, Ser: 0.74 mg/dL (ref 0.57–1.00)
Globulin, Total: 1.8 g/dL (ref 1.5–4.5)
Glucose: 90 mg/dL (ref 65–99)
Potassium: 4.9 mmol/L (ref 3.5–5.2)
Sodium: 141 mmol/L (ref 134–144)
Total Protein: 6.5 g/dL (ref 6.0–8.5)
eGFR: 95 mL/min/{1.73_m2} (ref >59)

## 2020-10-25 LAB — VITAMIN D 25 HYDROXY (VIT D DEFICIENCY, FRACTURES): Vit D, 25-Hydroxy: 32.9 ng/mL (ref 30.0–100.0)

## 2020-10-25 LAB — TSH: TSH: 0.791 u[IU]/mL (ref 0.450–4.500)

## 2020-10-25 LAB — HEMOGLOBIN A1C
Est. average glucose Bld gHb Est-mCnc: 108 mg/dL
Hgb A1c MFr Bld: 5.4 % (ref 4.8–5.6)

## 2020-10-29 ENCOUNTER — Ambulatory Visit (INDEPENDENT_AMBULATORY_CARE_PROVIDER_SITE_OTHER): Payer: Managed Care, Other (non HMO) | Admitting: Internal Medicine

## 2020-10-29 ENCOUNTER — Encounter: Payer: Self-pay | Admitting: Internal Medicine

## 2020-10-29 ENCOUNTER — Other Ambulatory Visit: Payer: Self-pay

## 2020-10-29 VITALS — BP 158/90 | HR 97 | Resp 18 | Ht 66.5 in | Wt 199.1 lb

## 2020-10-29 DIAGNOSIS — F411 Generalized anxiety disorder: Secondary | ICD-10-CM

## 2020-10-29 DIAGNOSIS — I10 Essential (primary) hypertension: Secondary | ICD-10-CM | POA: Diagnosis not present

## 2020-10-29 DIAGNOSIS — Z0001 Encounter for general adult medical examination with abnormal findings: Secondary | ICD-10-CM | POA: Diagnosis not present

## 2020-10-29 DIAGNOSIS — M8949 Other hypertrophic osteoarthropathy, multiple sites: Secondary | ICD-10-CM | POA: Diagnosis not present

## 2020-10-29 DIAGNOSIS — Z23 Encounter for immunization: Secondary | ICD-10-CM | POA: Diagnosis not present

## 2020-10-29 DIAGNOSIS — M159 Polyosteoarthritis, unspecified: Secondary | ICD-10-CM

## 2020-10-29 MED ORDER — LOSARTAN POTASSIUM-HCTZ 100-12.5 MG PO TABS: 1.0000 | ORAL_TABLET | Freq: Every day | ORAL | 0 refills | Status: DC

## 2020-10-29 NOTE — Patient Instructions (Signed)
Health Maintenance, Female Adopting a healthy lifestyle and getting preventive care are important in promoting health and wellness. Ask your health care provider about: The right schedule for you to have regular tests and exams. Things you can do on your own to prevent diseases and keep yourself healthy. What should I know about diet, weight, and exercise? Eat a healthy diet  Eat a diet that includes plenty of vegetables, fruits, low-fat dairy products, and lean protein. Do not eat a lot of foods that are high in solid fats, added sugars, or sodium.  Maintain a healthy weight Body mass index (BMI) is used to identify weight problems. It estimates body fat based on height and weight. Your health care provider can help determineyour BMI and help you achieve or maintain a healthy weight. Get regular exercise Get regular exercise. This is one of the most important things you can do for your health. Most adults should: Exercise for at least 150 minutes each week. The exercise should increase your heart rate and make you sweat (moderate-intensity exercise). Do strengthening exercises at least twice a week. This is in addition to the moderate-intensity exercise. Spend less time sitting. Even light physical activity can be beneficial. Watch cholesterol and blood lipids Have your blood tested for lipids and cholesterol at 57 years of age, then havethis test every 5 years. Have your cholesterol levels checked more often if: Your lipid or cholesterol levels are high. You are older than 57 years of age. You are at high risk for heart disease. What should I know about cancer screening? Depending on your health history and family history, you may need to have cancer screening at various ages. This may include screening for: Breast cancer. Cervical cancer. Colorectal cancer. Skin cancer. Lung cancer. What should I know about heart disease, diabetes, and high blood pressure? Blood pressure and heart  disease High blood pressure causes heart disease and increases the risk of stroke. This is more likely to develop in people who have high blood pressure readings, are of African descent, or are overweight. Have your blood pressure checked: Every 3-5 years if you are 18-39 years of age. Every year if you are 40 years old or older. Diabetes Have regular diabetes screenings. This checks your fasting blood sugar level. Have the screening done: Once every three years after age 40 if you are at a normal weight and have a low risk for diabetes. More often and at a younger age if you are overweight or have a high risk for diabetes. What should I know about preventing infection? Hepatitis B If you have a higher risk for hepatitis B, you should be screened for this virus. Talk with your health care provider to find out if you are at risk forhepatitis B infection. Hepatitis C Testing is recommended for: Everyone born from 1945 through 1965. Anyone with known risk factors for hepatitis C. Sexually transmitted infections (STIs) Get screened for STIs, including gonorrhea and chlamydia, if: You are sexually active and are younger than 57 years of age. You are older than 57 years of age and your health care provider tells you that you are at risk for this type of infection. Your sexual activity has changed since you were last screened, and you are at increased risk for chlamydia or gonorrhea. Ask your health care provider if you are at risk. Ask your health care provider about whether you are at high risk for HIV. Your health care provider may recommend a prescription medicine to help   prevent HIV infection. If you choose to take medicine to prevent HIV, you should first get tested for HIV. You should then be tested every 3 months for as long as you are taking the medicine. Pregnancy If you are about to stop having your period (premenopausal) and you may become pregnant, seek counseling before you get  pregnant. Take 400 to 800 micrograms (mcg) of folic acid every day if you become pregnant. Ask for birth control (contraception) if you want to prevent pregnancy. Osteoporosis and menopause Osteoporosis is a disease in which the bones lose minerals and strength with aging. This can result in bone fractures. If you are 65 years old or older, or if you are at risk for osteoporosis and fractures, ask your health care provider if you should: Be screened for bone loss. Take a calcium or vitamin D supplement to lower your risk of fractures. Be given hormone replacement therapy (HRT) to treat symptoms of menopause. Follow these instructions at home: Lifestyle Do not use any products that contain nicotine or tobacco, such as cigarettes, e-cigarettes, and chewing tobacco. If you need help quitting, ask your health care provider. Do not use street drugs. Do not share needles. Ask your health care provider for help if you need support or information about quitting drugs. Alcohol use Do not drink alcohol if: Your health care provider tells you not to drink. You are pregnant, may be pregnant, or are planning to become pregnant. If you drink alcohol: Limit how much you use to 0-1 drink a day. Limit intake if you are breastfeeding. Be aware of how much alcohol is in your drink. In the [place] one drink equals one 12 oz bottle of beer (355 mL), one 5 oz glass of wine (148 mL), or one 1 oz glass of hard liquor (44 mL). General instructions Schedule regular health, dental, and eye exams. Stay current with your vaccines. Tell your health care provider if: You often feel depressed. You have ever been abused or do not feel safe at home. Summary Adopting a healthy lifestyle and getting preventive care are important in promoting health and wellness. Follow your health care provider's instructions about healthy diet, exercising, and getting tested or screened for diseases. Follow your health care provider's  instructions on monitoring your cholesterol and blood pressure. This information is not intended to replace advice given to you by your health care provider. Make sure you discuss any questions you have with your healthcare provider. Document Revised: 02/09/2018 Document Reviewed: 02/09/2018 Elsevier Patient Education  2022 [employer]  

## 2020-10-29 NOTE — Progress Notes (Signed)
Established Patient Office Visit  Subjective:  Patient ID: Lori Mullins, adult    DOB: 16-May-1965  Age: 55 y.o. MRN: [id number]  CC:  Chief Complaint  Patient presents with   Annual Exam    Annual exam just lost her father so dealing with grief other than that doing ok     HPI Lori Mullins is a 55 year old female with PMH of HTN, OA, GAD, colonic polyps and obesity who presents for annual physical.  HTN: BP is elevated today. She states that she had to rush to come to office on time for her appointment. Her BP was elevated later during the visit as well. Takes medications regularly. Patient denies headache, dizziness, chest pain, dyspnea or palpitations.  GAD: Well-controlled with Xanax PRN. Denies any SI or HI.  OA and chronic pain: She has been taking Tramadol on weekdays and Norco on weekends. She has brought bottle of Norco for verification. She also follows up with Orthopedic surgeon for steroid injections.  She received flu vaccine in the office today. Past Medical History:  Diagnosis Date   Anxiety    Arthritis    Hip/Hands (right)   Bilateral carpal tunnel syndrome 11/29/2018   Campylobacter intestinal infection 1996   Became systemic   Cellulitis    occular   Hypertension    OCD (obsessive compulsive disorder) 2007   Plantar fasciitis    seen by orthoStevie Kern    Past Surgical History:  Procedure Laterality Date   CARPAL TUNNEL RELEASE Left 03/29/2019   Procedure: CARPAL TUNNEL RELEASE ENDOSCOPIC;  Surgeon: Corky Mull, MD;  Location: [place];  Service: Orthopedics;  Laterality: Left;   CARPAL TUNNEL RELEASE Right 05/31/2019   Procedure: CARPAL TUNNEL RELEASE ENDOSCOPIC;  Surgeon: Corky Mull, MD;  Location: [facility];  Service: Orthopedics;  Laterality: Right;   CERVICAL DISCECTOMY     C4-C5   COLONOSCOPY WITH PROPOFOL N/A 05/15/2019   Procedure: COLONOSCOPY WITH PROPOFOL;  Surgeon: Daneil Dolin, MD;  [facility];  Service:  Endoscopy;  Laterality: N/A;  11:15am   EYE SURGERY N/A    [facility] 05/14/2020   POLYPECTOMY  05/15/2019   Procedure: POLYPECTOMY;  Surgeon: Daneil Dolin, MD;  [facility];  Service: Endoscopy;;   RETINAL DETACHMENT SURGERY Left 2005   SPINE SURGERY N/A    [facility]reesia 05/14/2020    Family History  Problem Relation Age of Onset   Hypertension Father    Colon cancer Neg Hx    Colon polyps Neg Hx     Social History   Socioeconomic History   Marital status: Married    Spouse name: Not on file   Number of children: Not on file   Years of education: Not on file   Highest education level: Not on file  Occupational History   Not on file  Tobacco Use   Smoking status: Former    Packs/day: 2.00    Years: 20.00    Pack years: 40.00    Types: Cigarettes    Quit date: 2010    Years since quitting: 12.6   Smokeless tobacco: Never  Vaping Use   Vaping Use: Never used  Substance and Sexual Activity   Alcohol use: Yes    Alcohol/week: 7.0 standard drinks    Types: 7 Standard drinks or equivalent per week    Comment: occasionally   Drug use: Yes    Types: Marijuana   Sexual activity: Yes  Other Topics Concern  Not on file  Social History Narrative   Not on file   Social Determinants of Health   Financial Resource Strain: Not on file  Food Insecurity: Not on file  Transportation Needs: Not on file  Physical Activity: Not on file  Stress: Not on file  Social Connections: Not on file  Intimate Partner Violence: Not on file    Outpatient Medications Prior to Visit  Medication Sig Dispense Refill   acetaminophen (TYLENOL) 325 MG tablet Take 650 mg by mouth every 6 (six) hours as needed.     ALPRAZolam (XANAX) 1 MG tablet Take 1 tablet (1 mg total) by mouth 2 (two) times daily as needed for anxiety. 60 tablet 2   CANNABIDIOL PO Take 1 capsule by mouth daily. Full spectrum CBD Supplement     cyclobenzaprine (FLEXERIL) 10 MG tablet TAKE 1 TABLET(10 MG) BY MOUTH AT  BEDTIME AS NEEDED FOR MUSCLE SPASMS 45 tablet 1   diclofenac Sodium (VOLTAREN) 1 % GEL Apply 2 g topically 4 (four) times daily as needed. (Patient taking differently: Apply 2 g topically in the morning, at noon, and at bedtime.) 300 g 3   fexofenadine (ALLEGRA) 180 MG tablet Take 180 mg by mouth daily.     GENISTEIN PO Take 1,000 mg by mouth. Take 1000 mg daily     Glycerin-Hypromellose-PEG 400 (DRY EYE RELIEF DROPS) 0.2-0.2-1 % SOLN Place 1 drop into both eyes 3 (three) times daily as needed (DRY/IRRITATED EYES.).     HYDROcodone-acetaminophen (NORCO/VICODIN) 5-325 MG tablet Take 1 tablet by mouth every 6 (six) hours as needed for moderate pain. 60 tablet 0   Magnesium 100 MG TABS Take by mouth.     Multiple Vitamin (MULTIVITAMIN WITH MINERALS) TABS tablet Take 1 tablet by mouth daily.     traMADol (ULTRAM) 50 MG tablet TAKE 1 TABLET(50 MG) BY MOUTH EVERY 12 HOURS AS NEEDED 60 tablet 2   vitamin E 600 UNIT capsule Take 600 Units by mouth daily.     losartan (COZAAR) 100 MG tablet Take 1 tablet (100 mg total) by mouth daily. 90 tablet 1   No facility-administered medications prior to visit.    Allergies  Allergen Reactions   Milk-Related Compounds Anaphylaxis    ROS Review of Systems  Constitutional:  Negative for chills and fever.  HENT:  Negative for congestion, sinus pressure, sinus pain and sore throat.   Eyes:  Negative for pain and discharge.  Respiratory:  Negative for cough and shortness of breath.   Cardiovascular:  Negative for chest pain and palpitations.  Gastrointestinal:  Negative for abdominal pain, constipation, diarrhea, nausea and vomiting.  Endocrine: Negative for polydipsia and polyuria.  Genitourinary:  Negative for dysuria and hematuria.  Musculoskeletal:  Positive for arthralgias. Negative for neck pain and neck stiffness.  Skin:  Negative for rash.  Neurological:  Negative for dizziness, weakness, numbness and headaches.  Psychiatric/Behavioral:  Negative for  agitation, behavioral problems and suicidal ideas. The patient is nervous/anxious.      Objective:    Physical Exam Vitals reviewed.  Constitutional:      General: She is not in acute distress.    Appearance: She is not diaphoretic.  HENT:     Head: Normocephalic and atraumatic.     Nose: Nose normal.     Mouth/Throat:     Mouth: Mucous membranes are moist.  Eyes:     General: No scleral icterus.    Extraocular Movements: Extraocular movements intact.  Neck:  Vascular: No carotid bruit.  Cardiovascular:     Rate and Rhythm: Normal rate and regular rhythm.     Pulses: Normal pulses.     Heart sounds: Normal heart sounds. No murmur heard. Pulmonary:     Breath sounds: Normal breath sounds. No wheezing or rales.  Musculoskeletal:     Cervical back: Neck supple. No tenderness.     Right lower leg: No edema.     Left lower leg: No edema.  Skin:    General: Skin is warm.     Findings: No rash.  Neurological:     General: No focal deficit present.     Mental Status: She is alert and oriented to person, place, and time.     Cranial Nerves: No cranial nerve deficit.     Sensory: No sensory deficit.     Motor: No weakness.  Psychiatric:        Mood and Affect: Mood normal.        Behavior: Behavior normal.    BP (!) 158/90 (BP Location: Left Arm, Cuff Size: Normal)   Pulse 97   Resp 18   Ht 5' 6.5" (1.689 m)   Wt 199 lb 1.3 oz (90.3 kg)   LMP 03/01/2017   SpO2 99%   BMI 31.65 kg/m  Wt Readings from Last 3 Encounters:  10/29/20 199 lb 1.3 oz (90.3 kg)  06/26/20 211 lb (95.7 kg)  05/14/20 209 lb 12.8 oz (95.2 kg)     Health Maintenance Due  Topic Date Due   Zoster Vaccines- Shingrix (1 of 2) Never done    There are no preventive care reminders to display for this patient.  Lab Results  Component Value Date   TSH 0.791 10/24/2020   Lab Results  Component Value Date   WBC 6.4 10/24/2020   HGB 14.8 10/24/2020   HCT 44.7 10/24/2020   MCV 92 10/24/2020    PLT 282 10/24/2020   Lab Results  Component Value Date   NA 141 10/24/2020   K 4.9 10/24/2020   CO2 25 10/24/2020   GLUCOSE 90 10/24/2020   BUN 16 10/24/2020   CREATININE 0.74 10/24/2020   BILITOT 0.3 10/24/2020   ALKPHOS 91 10/24/2020   AST 20 10/24/2020   ALT 17 10/24/2020   PROT 6.5 10/24/2020   ALBUMIN 4.7 10/24/2020   CALCIUM 10.1 10/24/2020   EGFR 95 10/24/2020   Lab Results  Component Value Date   CHOL 163 10/24/2020   Lab Results  Component Value Date   HDL 59 10/24/2020   Lab Results  Component Value Date   LDLCALC 82 10/24/2020   Lab Results  Component Value Date   TRIG 126 10/24/2020   Lab Results  Component Value Date   CHOLHDL 2.8 10/24/2020   Lab Results  Component Value Date   HGBA1C 5.4 10/24/2020      Assessment & Plan:   Problem List Items Addressed This Visit       Encounter for general adult medical examination with abnormal findings - Primary   Annual exam as documented. Counseling done  re healthy lifestyle involving commitment to 150 minutes exercise per week, heart healthy diet, and attaining healthy weight.The importance of adequate sleep also discussed. Changes in health habits are decided on by the patient with goals and time frames  set for achieving them. Immunization and cancer screening needs are specifically addressed at this visit.       Cardiovascular and Mediastinum   Essential hypertension, benign  BP Readings from Last 1 Encounters:  10/29/20 (!) 158/90  uncontrolled with Losartan 100 mg QD Switched to Losartan-HCTZ 100-12.5 mg QD Counseled for compliance with the medications Advised DASH diet and moderate exercise/walking as tolerated      Relevant Medications   losartan-hydrochlorothiazide (HYZAAR) 100-12.5 MG tablet     Musculoskeletal and Integument   OA (osteoarthritis)    Gets steroid injections, follows up with Orthopedic surgery Tramadol 50 mg QD PRN Norco PRN, she takes it only on weekends         Other   GAD (generalized anxiety disorder)    Well-controlled with Xanax 1 mg BID Has tried SSRI in the past            Other Visit Diagnoses     Need for immunization against influenza   Patient was educated on the recommendation for flu vaccine. After obtaining informed consent, the vaccine was administered no adverse effects noted at time of administration. Patient provided with education on arm soreness and use of tylenol or ibuprofen (if safe) for this. Encourage to use the arm vaccine was given in to help reduce the soreness. Patient educated on the signs of a reaction to the vaccine and advised to contact the office should these occur.      Relevant Orders   Flu Vaccine QUAD 66moIM (Fluarix, Fluzone & Alfiuria Quad PF) (Completed)       Meds ordered this encounter  Medications   losartan-hydrochlorothiazide (HYZAAR) 100-12.5 MG tablet    Sig: Take 1 tablet by mouth daily.    Dispense:  90 tablet    Refill:  0    Follow-up: Return in about 3 months (around 01/29/2021) for HTN and chronic pain.    RLindell Spar MD

## 2020-10-30 NOTE — Assessment & Plan Note (Signed)
Annual exam as documented. Counseling done  re healthy lifestyle involving commitment to 150 minutes exercise per week, heart healthy diet, and attaining healthy weight.The importance of adequate sleep also discussed. Changes in health habits are decided on by the patient with goals and time frames  set for achieving them. Immunization and cancer screening needs are specifically addressed at this visit. 

## 2020-10-30 NOTE — Assessment & Plan Note (Signed)
Gets steroid injections, follows up with Orthopedic surgery Tramadol 50 mg QD PRN Norco PRN, she takes it only on weekends

## 2020-10-30 NOTE — Assessment & Plan Note (Signed)
BP Readings from Last 1 Encounters:  10/29/20 (!) 158/90   uncontrolled with Losartan 100 mg QD Switched to Losartan-HCTZ 100-12.5 mg QD Counseled for compliance with the medications Advised DASH diet and moderate exercise/walking as tolerated

## 2020-10-30 NOTE — Assessment & Plan Note (Signed)
Well-controlled with Xanax 1 mg BID Has tried SSRI in the past 

## 2020-12-01 ENCOUNTER — Other Ambulatory Visit: Payer: Self-pay | Admitting: Internal Medicine

## 2020-12-01 DIAGNOSIS — M1611 Unilateral primary osteoarthritis, right hip: Secondary | ICD-10-CM

## 2020-12-03 ENCOUNTER — Other Ambulatory Visit: Payer: Self-pay | Admitting: Nurse Practitioner

## 2020-12-03 ENCOUNTER — Other Ambulatory Visit: Payer: Self-pay | Admitting: Internal Medicine

## 2020-12-03 ENCOUNTER — Encounter: Payer: Self-pay | Admitting: Internal Medicine

## 2020-12-03 DIAGNOSIS — F411 Generalized anxiety disorder: Secondary | ICD-10-CM

## 2020-12-03 DIAGNOSIS — M1611 Unilateral primary osteoarthritis, right hip: Secondary | ICD-10-CM

## 2020-12-03 MED ORDER — ALPRAZOLAM 1 MG PO TABS: 1.0000 mg | ORAL_TABLET | Freq: Two times a day (BID) | ORAL | 2 refills | Status: DC | PRN

## 2020-12-03 MED ORDER — TRAMADOL HCL 50 MG PO TABS: 50.0000 mg | ORAL_TABLET | Freq: Two times a day (BID) | ORAL | 2 refills | Status: DC | PRN

## 2020-12-08 ENCOUNTER — Encounter: Payer: Self-pay | Admitting: Internal Medicine

## 2020-12-09 ENCOUNTER — Other Ambulatory Visit: Payer: Self-pay | Admitting: Internal Medicine

## 2020-12-09 DIAGNOSIS — M1611 Unilateral primary osteoarthritis, right hip: Secondary | ICD-10-CM

## 2020-12-09 MED ORDER — HYDROCODONE-ACETAMINOPHEN 5-325 MG PO TABS: 1.0000 | ORAL_TABLET | Freq: Four times a day (QID) | ORAL | 0 refills | Status: DC | PRN

## 2020-12-18 ENCOUNTER — Other Ambulatory Visit: Payer: Self-pay | Admitting: *Deleted

## 2020-12-18 DIAGNOSIS — I10 Essential (primary) hypertension: Secondary | ICD-10-CM

## 2020-12-18 MED ORDER — LOSARTAN POTASSIUM-HCTZ 100-12.5 MG PO TABS: 1.0000 | ORAL_TABLET | Freq: Every day | ORAL | 0 refills | Status: DC

## 2020-12-30 ENCOUNTER — Other Ambulatory Visit: Payer: Self-pay | Admitting: Internal Medicine

## 2020-12-30 ENCOUNTER — Encounter: Payer: Self-pay | Admitting: Internal Medicine

## 2020-12-30 DIAGNOSIS — F411 Generalized anxiety disorder: Secondary | ICD-10-CM

## 2020-12-30 MED ORDER — ALPRAZOLAM 1 MG PO TABS: 1.0000 mg | ORAL_TABLET | Freq: Two times a day (BID) | ORAL | 2 refills | Status: DC | PRN

## 2021-01-22 ENCOUNTER — Encounter: Payer: Self-pay | Admitting: Internal Medicine

## 2021-01-22 ENCOUNTER — Other Ambulatory Visit: Payer: Self-pay

## 2021-01-22 ENCOUNTER — Ambulatory Visit: Payer: Managed Care, Other (non HMO) | Admitting: Internal Medicine

## 2021-01-22 VITALS — BP 132/82 | HR 95 | Resp 18 | Ht 66.0 in | Wt 187.1 lb

## 2021-01-22 DIAGNOSIS — M159 Polyosteoarthritis, unspecified: Secondary | ICD-10-CM | POA: Diagnosis not present

## 2021-01-22 DIAGNOSIS — F411 Generalized anxiety disorder: Secondary | ICD-10-CM | POA: Diagnosis not present

## 2021-01-22 DIAGNOSIS — I10 Essential (primary) hypertension: Secondary | ICD-10-CM

## 2021-01-22 DIAGNOSIS — G894 Chronic pain syndrome: Secondary | ICD-10-CM | POA: Diagnosis not present

## 2021-01-22 NOTE — Progress Notes (Signed)
Established Patient Office Visit  Subjective:  Patient ID: Lori Mullins, adult    DOB: Apr 06, 1965  Age: 55 y.o. MRN: [id number]  CC:  Chief Complaint  Patient presents with   Follow-up    3 month follow up pt has left ear ache since yesterday     HPI Lori Mullins is a 55 y.o. adult with past medical history of HTN, OA, chronic pain syndrome, GAD, colonic polyps and obesity who  presents for f/u of her chronic medical conditions.  HTN: BP is better controlled now with losartan-HCTZ. Takes medications regularly. Patient denies headache, dizziness, chest pain, dyspnea or palpitations.   GAD: Well-controlled with Xanax PRN. Denies any SI or HI.   OA and chronic pain: She has been taking Tramadol on weekdays and Norco on weekends. She also follows up with Orthopedic surgeon for steroid injections.  She complains of left ear pain since yesterday.  Denies any ear discharge, hearing problem, tinnitus or balance problem currently.  She usually uses oil for cleaning and gets relief with such discomfort.  Upon evaluation, she has mild earwax in the left ear, which does not seem to be impacted.  No erythema in the ER canal was noted.   Past Medical History:  Diagnosis Date   Anxiety    Arthritis    Hip/Hands (right)   Bilateral carpal tunnel syndrome 11/29/2018   Campylobacter intestinal infection 1996   Became systemic   Cellulitis    occular   Hypertension    OCD (obsessive compulsive disorder) 2007   Plantar fasciitis    seen by orthoStevie Kern    Past Surgical History:  Procedure Laterality Date   CARPAL TUNNEL RELEASE Left 03/29/2019   Procedure: CARPAL TUNNEL RELEASE ENDOSCOPIC;  Surgeon: Corky Mull, MD;  Location: [place];  Service: Orthopedics;  Laterality: Left;   CARPAL TUNNEL RELEASE Right 05/31/2019   Procedure: CARPAL TUNNEL RELEASE ENDOSCOPIC;  Surgeon: Corky Mull, MD;  Location: [facility];  Service: Orthopedics;  Laterality: Right;   CERVICAL  DISCECTOMY     C4-C5   COLONOSCOPY WITH PROPOFOL N/A 05/15/2019   Procedure: COLONOSCOPY WITH PROPOFOL;  Surgeon: Daneil Dolin, MD;  [facility];  Service: Endoscopy;  Laterality: N/A;  11:15am   EYE SURGERY N/A    [facility] 05/14/2020   POLYPECTOMY  05/15/2019   Procedure: POLYPECTOMY;  Surgeon: Daneil Dolin, MD;  [facility];  Service: Endoscopy;;   RETINAL DETACHMENT SURGERY Left 2005   SPINE SURGERY N/A    [facility] 05/14/2020    Family History  Problem Relation Age of Onset   Hypertension Father    Colon cancer Neg Hx    Colon polyps Neg Hx     Social History   Socioeconomic History   Marital status: Married    Spouse name: Not on file   Number of children: Not on file   Years of education: Not on file   Highest education level: Not on file  Occupational History   Not on file  Tobacco Use   Smoking status: Former    Packs/day: 2.00    Years: 20.00    Pack years: 40.00    Types: Cigarettes    Quit date: 2010    Years since quitting: 12.9   Smokeless tobacco: Never  Vaping Use   Vaping Use: Never used  Substance and Sexual Activity   Alcohol use: Yes    Alcohol/week: 7.0 standard drinks    Types:  7 Standard drinks or equivalent per week    Comment: occasionally   Drug use: Yes    Types: Marijuana   Sexual activity: Yes  Other Topics Concern   Not on file  Social History Narrative   Not on file   Social Determinants of Health   Financial Resource Strain: Not on file  Food Insecurity: Not on file  Transportation Needs: Not on file  Physical Activity: Not on file  Stress: Not on file  Social Connections: Not on file  Intimate Partner Violence: Not on file    Outpatient Medications Prior to Visit  Medication Sig Dispense Refill   acetaminophen (TYLENOL) 325 MG tablet Take 650 mg by mouth every 6 (six) hours as needed.     ALPRAZolam (XANAX) 1 MG tablet Take 1 tablet (1 mg total) by mouth 2 (two) times daily as needed for  anxiety. 60 tablet 2   CANNABIDIOL PO Take 1 capsule by mouth daily. Full spectrum CBD Supplement     cyclobenzaprine (FLEXERIL) 10 MG tablet TAKE 1 TABLET(10 MG) BY MOUTH AT BEDTIME AS NEEDED FOR MUSCLE SPASMS 45 tablet 1   diclofenac Sodium (VOLTAREN) 1 % GEL Apply 2 g topically 4 (four) times daily as needed. (Patient taking differently: Apply 2 g topically in the morning, at noon, and at bedtime.) 300 g 3   fexofenadine (ALLEGRA) 180 MG tablet Take 180 mg by mouth daily.     GENISTEIN PO Take 1,000 mg by mouth. Take 1000 mg daily     Glycerin-Hypromellose-PEG 400 (DRY EYE RELIEF DROPS) 0.2-0.2-1 % SOLN Place 1 drop into both eyes 3 (three) times daily as needed (DRY/IRRITATED EYES.).     HYDROcodone-acetaminophen (NORCO/VICODIN) 5-325 MG tablet Take 1 tablet by mouth every 6 (six) hours as needed for moderate pain. 60 tablet 0   losartan-hydrochlorothiazide (HYZAAR) 100-12.5 MG tablet Take 1 tablet by mouth daily. 90 tablet 0   Magnesium 100 MG TABS Take by mouth.     Multiple Vitamin (MULTIVITAMIN WITH MINERALS) TABS tablet Take 1 tablet by mouth daily.     traMADol (ULTRAM) 50 MG tablet Take 1 tablet (50 mg total) by mouth every 12 (twelve) hours as needed. TAKE 1 TABLET(50 MG) BY MOUTH EVERY 12 HOURS AS NEEDED 60 tablet 2   vitamin E 600 UNIT capsule Take 600 Units by mouth daily.     No facility-administered medications prior to visit.    Allergies  Allergen Reactions   Milk-Related Compounds Anaphylaxis    ROS Review of Systems  Constitutional:  Negative for chills and fever.  HENT:  Negative for congestion, sinus pressure, sinus pain and sore throat.   Eyes:  Negative for pain and discharge.  Respiratory:  Negative for cough and shortness of breath.   Cardiovascular:  Negative for chest pain and palpitations.  Gastrointestinal:  Negative for abdominal pain, constipation, diarrhea, nausea and vomiting.  Endocrine: Negative for polydipsia and polyuria.  Genitourinary:  Negative  for dysuria and hematuria.  Musculoskeletal:  Positive for arthralgias. Negative for neck pain and neck stiffness.  Skin:  Negative for rash.  Neurological:  Negative for dizziness, weakness, numbness and headaches.  Psychiatric/Behavioral:  Negative for agitation, behavioral problems and suicidal ideas. The patient is nervous/anxious.      Objective:    Physical Exam Vitals reviewed.  Constitutional:      General: He is not in acute distress.    Appearance: He is not diaphoretic.  HENT:     Head: Normocephalic and atraumatic.  Right Ear: Tympanic membrane and ear canal normal.     Left Ear: Tympanic membrane and ear canal normal.     Nose: Nose normal.     Mouth/Throat:     Mouth: Mucous membranes are moist.  Eyes:     General: No scleral icterus.    Extraocular Movements: Extraocular movements intact.  Neck:     Vascular: No carotid bruit.  Cardiovascular:     Rate and Rhythm: Normal rate and regular rhythm.     Pulses: Normal pulses.     Heart sounds: Normal heart sounds. No murmur heard. Pulmonary:     Breath sounds: Normal breath sounds. No wheezing or rales.  Musculoskeletal:     Cervical back: Neck supple. No tenderness.     Right lower leg: No edema.     Left lower leg: No edema.  Skin:    General: Skin is warm.     Findings: No rash.  Neurological:     General: No focal deficit present.     Mental Status: He is alert and oriented to person, place, and time.     Sensory: No sensory deficit.     Motor: No weakness.  Psychiatric:        Mood and Affect: Mood normal.        Behavior: Behavior normal.    BP 132/82 (BP Location: Left Arm, Patient Position: Sitting, Cuff Size: Normal)   Pulse 95   Resp 18   Ht 5' 6"  (1.676 m)   Wt 187 lb 1.9 oz (84.9 kg)   LMP 03/01/2017   SpO2 96%   BMI 30.20 kg/m  Wt Readings from Last 3 Encounters:  01/22/21 187 lb 1.9 oz (84.9 kg)  10/29/20 199 lb 1.3 oz (90.3 kg)  06/26/20 211 lb (95.7 kg)    Lab Results   Component Value Date   TSH 0.791 10/24/2020   Lab Results  Component Value Date   WBC 6.4 10/24/2020   HGB 14.8 10/24/2020   HCT 44.7 10/24/2020   MCV 92 10/24/2020   PLT 282 10/24/2020   Lab Results  Component Value Date   NA 141 10/24/2020   K 4.9 10/24/2020   CO2 25 10/24/2020   GLUCOSE 90 10/24/2020   BUN 16 10/24/2020   CREATININE 0.74 10/24/2020   BILITOT 0.3 10/24/2020   ALKPHOS 91 10/24/2020   AST 20 10/24/2020   ALT 17 10/24/2020   PROT 6.5 10/24/2020   ALBUMIN 4.7 10/24/2020   CALCIUM 10.1 10/24/2020   EGFR 95 10/24/2020   Lab Results  Component Value Date   CHOL 163 10/24/2020   Lab Results  Component Value Date   HDL 59 10/24/2020   Lab Results  Component Value Date   LDLCALC 82 10/24/2020   Lab Results  Component Value Date   TRIG 126 10/24/2020   Lab Results  Component Value Date   CHOLHDL 2.8 10/24/2020   Lab Results  Component Value Date   HGBA1C 5.4 10/24/2020      Assessment & Plan:   Problem List Items Addressed This Visit       Cardiovascular and Mediastinum   Essential hypertension, benign - Primary    BP Readings from Last 1 Encounters:  01/22/21 132/82  Well-controlled with Losartan-HCTZ 100-12.5 mg QD now Counseled for compliance with the medications Advised DASH diet and moderate exercise/walking as tolerated        Musculoskeletal and Integument   OA (osteoarthritis)    Gets steroid injections, follows  up with Orthopedic surgery -[facility] Tramadol 50 mg QD PRN Norco PRN, she takes it only on weekends        Other   GAD (generalized anxiety disorder)    Well-controlled with Xanax 1 mg BID Has tried SSRI in the past      Chronic pain syndrome    Due to OA of hip and knee On tramadol 50 mg twice daily as needed on weekdays On Norco as needed on weekends PDMP reviewed       No orders of the defined types were placed in this encounter.   Follow-up: Return in about 3 months (around 04/24/2021) for  With [facility] - PAP smear.    Lindell Spar, MD

## 2021-01-22 NOTE — Assessment & Plan Note (Signed)
Gets steroid injections, follows up with Orthopedic surgery -[facility] Tramadol 50 mg QD PRN Norco PRN, she takes it only on weekends

## 2021-01-22 NOTE — Assessment & Plan Note (Signed)
BP Readings from Last 1 Encounters:  01/22/21 132/82   Well-controlled with Losartan-HCTZ 100-12.5 mg QD now Counseled for compliance with the medications Advised DASH diet and moderate exercise/walking as tolerated

## 2021-01-22 NOTE — Patient Instructions (Signed)
Please continue to take medications as prescribed.  Please continue to follow low salt diet and perform moderate exercise/walking at least 150 mins/week.  

## 2021-01-22 NOTE — Assessment & Plan Note (Signed)
Well-controlled with Xanax 1 mg BID Has tried SSRI in the past 

## 2021-01-22 NOTE — Assessment & Plan Note (Signed)
Due to OA of hip and knee On tramadol 50 mg twice daily as needed on weekdays On Norco as needed on weekends PDMP reviewed

## 2021-02-13 ENCOUNTER — Other Ambulatory Visit: Payer: Self-pay | Admitting: Internal Medicine

## 2021-02-13 ENCOUNTER — Encounter: Payer: Self-pay | Admitting: Internal Medicine

## 2021-02-13 DIAGNOSIS — M1611 Unilateral primary osteoarthritis, right hip: Secondary | ICD-10-CM

## 2021-02-13 MED ORDER — HYDROCODONE-ACETAMINOPHEN 5-325 MG PO TABS: 1.0000 | ORAL_TABLET | Freq: Four times a day (QID) | ORAL | 0 refills | Status: DC | PRN

## 2021-02-18 ENCOUNTER — Encounter: Payer: Self-pay | Admitting: Internal Medicine

## 2021-02-19 ENCOUNTER — Telehealth (INDEPENDENT_AMBULATORY_CARE_PROVIDER_SITE_OTHER): Payer: Self-pay

## 2021-02-19 NOTE — Telephone Encounter (Signed)
See telephone note.

## 2021-02-28 ENCOUNTER — Other Ambulatory Visit: Payer: Self-pay | Admitting: Internal Medicine

## 2021-02-28 DIAGNOSIS — M1611 Unilateral primary osteoarthritis, right hip: Secondary | ICD-10-CM

## 2021-03-01 ENCOUNTER — Other Ambulatory Visit: Payer: Self-pay | Admitting: Internal Medicine

## 2021-03-01 DIAGNOSIS — M1611 Unilateral primary osteoarthritis, right hip: Secondary | ICD-10-CM

## 2021-03-17 ENCOUNTER — Other Ambulatory Visit: Payer: Self-pay | Admitting: Internal Medicine

## 2021-03-17 DIAGNOSIS — I10 Essential (primary) hypertension: Secondary | ICD-10-CM

## 2021-04-04 ENCOUNTER — Other Ambulatory Visit: Payer: Self-pay | Admitting: Internal Medicine

## 2021-04-04 DIAGNOSIS — F411 Generalized anxiety disorder: Secondary | ICD-10-CM

## 2021-04-08 ENCOUNTER — Encounter: Payer: Self-pay | Admitting: Internal Medicine

## 2021-04-10 ENCOUNTER — Telehealth: Payer: Self-pay | Admitting: Internal Medicine

## 2021-04-10 NOTE — Telephone Encounter (Signed)
Surgical Clearance  [facility] Dr Lyla Glassing  * Contact Kerri Maze ph [phone] Fax [phone]  Right Total Hip Arthoplasty   04-10-21 Called LVM on [phone]  for the pt to call the office to schedule

## 2021-04-22 ENCOUNTER — Other Ambulatory Visit: Payer: Self-pay

## 2021-04-22 ENCOUNTER — Ambulatory Visit: Payer: Managed Care, Other (non HMO) | Admitting: Internal Medicine

## 2021-04-22 ENCOUNTER — Encounter: Payer: Self-pay | Admitting: Internal Medicine

## 2021-04-22 VITALS — BP 142/88 | HR 117 | Resp 18 | Ht 67.0 in | Wt 173.8 lb

## 2021-04-22 DIAGNOSIS — I10 Essential (primary) hypertension: Secondary | ICD-10-CM | POA: Diagnosis not present

## 2021-04-22 DIAGNOSIS — Z01818 Encounter for other preprocedural examination: Secondary | ICD-10-CM

## 2021-04-22 NOTE — Progress Notes (Deleted)
Established Patient Office Visit  Subjective:  Patient ID: Lori Mullins, adult    DOB: 1965-05-26  Age: 56 y.o. MRN: [id number]  CC:  Chief Complaint  Patient presents with   Acute Visit    Pt needs surgery clearance for hip surgery     HPI Lori Mullins is a 56 y.o. adult with past medical history of HTN, OA, chronic pain syndrome, GAD, colonic polyps and obesity who presents for preop exam.  She has history of severe OA of right hip, for which she is planning to get right THA.  She is currently taking  Past Medical History:  Diagnosis Date   Anxiety    Arthritis    Hip/Hands (right)   Bilateral carpal tunnel syndrome 11/29/2018   Campylobacter intestinal infection 1996   Became systemic   Cellulitis    occular   Hypertension    OCD (obsessive compulsive disorder) 2007   Plantar fasciitis    seen by orthoStevie Kern    Past Surgical History:  Procedure Laterality Date   CARPAL TUNNEL RELEASE Left 03/29/2019   Procedure: CARPAL TUNNEL RELEASE ENDOSCOPIC;  Surgeon: Corky Mull, MD;  Location: [facility];  Service: Orthopedics;  Laterality: Left;   CARPAL TUNNEL RELEASE Right 05/31/2019   Procedure: CARPAL TUNNEL RELEASE ENDOSCOPIC;  Surgeon: Corky Mull, MD;  Location: [facility];  Service: Orthopedics;  Laterality: Right;   CERVICAL DISCECTOMY     C4-C5   COLONOSCOPY WITH PROPOFOL N/A 05/15/2019   Procedure: COLONOSCOPY WITH PROPOFOL;  Surgeon: Daneil Dolin, MD;  [facility];  Service: Endoscopy;  Laterality: N/A;  11:15am   EYE SURGERY N/A    [facility] 05/14/2020   POLYPECTOMY  05/15/2019   Procedure: POLYPECTOMY;  Surgeon: Daneil Dolin, MD;  [facility]: [facility];  Service: Endoscopy;;   RETINAL DETACHMENT SURGERY Left 2005   SPINE SURGERY N/A    [facility] 05/14/2020    Family History  Problem Relation Age of Onset   Hypertension Father    Colon cancer Neg Hx    Colon polyps Neg Hx     Social History   Socioeconomic History    Marital status: Married    Spouse name: Not on file   Number of children: Not on file   Years of education: Not on file   Highest education level: Not on file  Occupational History   Not on file  Tobacco Use   Smoking status: Former    Packs/day: 2.00    Years: 20.00    Pack years: 40.00    Types: Cigarettes    Quit date: 2010    Years since quitting: 13.1   Smokeless tobacco: Never  Vaping Use   Vaping Use: Never used  Substance and Sexual Activity   Alcohol use: Yes    Alcohol/week: 7.0 standard drinks    Types: 7 Standard drinks or equivalent per week    Comment: occasionally   Drug use: Yes    Types: Marijuana   Sexual activity: Yes  Other Topics Concern   Not on file  Social History Narrative   Not on file   Social Determinants of Health   Financial Resource Strain: Not on file  Food Insecurity: Not on file  Transportation Needs: Not on file  Physical Activity: Not on file  Stress: Not on file  Social Connections: Not on file  Intimate Partner Violence: Not on file    Outpatient Medications Prior to Visit  Medication Sig  Dispense Refill   acetaminophen (TYLENOL) 325 MG tablet Take 650 mg by mouth every 6 (six) hours as needed.     ALPRAZolam (XANAX) 1 MG tablet TAKE 1 TABLET(1 MG) BY MOUTH TWICE DAILY AS NEEDED FOR ANXIETY 60 tablet 2   CANNABIDIOL PO Take 1 capsule by mouth daily. Full spectrum CBD Supplement     cyclobenzaprine (FLEXERIL) 10 MG tablet TAKE 1 TABLET(10 MG) BY MOUTH AT BEDTIME AS NEEDED FOR MUSCLE SPASMS 45 tablet 1   diclofenac Sodium (VOLTAREN) 1 % GEL Apply 2 g topically 4 (four) times daily as needed. (Patient taking differently: Apply 2 g topically in the morning, at noon, and at bedtime.) 300 g 3   fexofenadine (ALLEGRA) 180 MG tablet Take 180 mg by mouth daily.     GENISTEIN PO Take 1,000 mg by mouth. Take 1000 mg daily     Glycerin-Hypromellose-PEG 400 (DRY EYE RELIEF DROPS) 0.2-0.2-1 % SOLN Place 1 drop into both eyes 3 (three) times  daily as needed (DRY/IRRITATED EYES.).     HYDROcodone-acetaminophen (NORCO/VICODIN) 5-325 MG tablet Take 1 tablet by mouth every 6 (six) hours as needed for moderate pain. 60 tablet 0   losartan-hydrochlorothiazide (HYZAAR) 100-12.5 MG tablet TAKE 1 TABLET DAILY 90 tablet 3   Magnesium 100 MG TABS Take by mouth.     Multiple Vitamin (MULTIVITAMIN WITH MINERALS) TABS tablet Take 1 tablet by mouth daily.     traMADol (ULTRAM) 50 MG tablet TAKE 1 TABLET(50 MG) BY MOUTH EVERY 12 HOURS AS NEEDED 60 tablet 2   vitamin E 600 UNIT capsule Take 600 Units by mouth daily.     No facility-administered medications prior to visit.    Allergies  Allergen Reactions   Milk-Related Compounds Anaphylaxis    ROS Review of Systems    Objective:    Physical Exam  BP (!) 142/88 (BP Location: Left Arm, Cuff Size: Normal)    Pulse (!) 117    Resp 18    Ht 5' 7"  (1.702 m)    Wt 173 lb 12.8 oz (78.8 kg)    LMP 03/01/2017    SpO2 98%    BMI 27.22 kg/m  Wt Readings from Last 3 Encounters:  04/22/21 173 lb 12.8 oz (78.8 kg)  01/22/21 187 lb 1.9 oz (84.9 kg)  10/29/20 199 lb 1.3 oz (90.3 kg)    Lab Results  Component Value Date   TSH 0.791 10/24/2020   Lab Results  Component Value Date   WBC 6.4 10/24/2020   HGB 14.8 10/24/2020   HCT 44.7 10/24/2020   MCV 92 10/24/2020   PLT 282 10/24/2020   Lab Results  Component Value Date   NA 141 10/24/2020   K 4.9 10/24/2020   CO2 25 10/24/2020   GLUCOSE 90 10/24/2020   BUN 16 10/24/2020   CREATININE 0.74 10/24/2020   BILITOT 0.3 10/24/2020   ALKPHOS 91 10/24/2020   AST 20 10/24/2020   ALT 17 10/24/2020   PROT 6.5 10/24/2020   ALBUMIN 4.7 10/24/2020   CALCIUM 10.1 10/24/2020   EGFR 95 10/24/2020   Lab Results  Component Value Date   CHOL 163 10/24/2020   Lab Results  Component Value Date   HDL 59 10/24/2020   Lab Results  Component Value Date   LDLCALC 82 10/24/2020   Lab Results  Component Value Date   TRIG 126 10/24/2020   Lab  Results  Component Value Date   CHOLHDL 2.8 10/24/2020   Lab Results  Component Value  Date   HGBA1C 5.4 10/24/2020      Assessment & Plan:   Problem List Items Addressed This Visit       Cardiovascular and Mediastinum   Essential hypertension, benign    BP Readings from Last 1 Encounters:  04/22/21 (!) 138/98  Well-controlled with Losartan-HCTZ 100-12.5 mg QD now Counseled for compliance with the medications Advised DASH diet and moderate exercise/walking as tolerated      Relevant Orders   EKG 12-Lead (Completed)     Other   Preop examination - Primary    No orders of the defined types were placed in this encounter.   Follow-up: Return if symptoms worsen or fail to improve.    Lindell Spar, MD

## 2021-04-22 NOTE — Progress Notes (Signed)
Established Patient Office Visit  Subjective:  Patient ID: Lori Mullins, adult    DOB: 02/27/1966  Age: 56 y.o. MRN: [id number]  CC:  Chief Complaint  Patient presents with   Acute Visit    Pt needs surgery clearance for hip surgery     HPI Lori Mullins is a 56 y.o. adult with past medical history of HTN, OA, chronic pain syndrome, GAD, colonic polyps and obesity who presents for her preop exam for hip arthroplasty.  She has been taking tramadol on weekdays and Norco on weekends for chronic pain from OA of hip.  She also has had steroid injections for hip OA.  Past Medical History:  Diagnosis Date   Anxiety    Arthritis    Hip/Hands (right)   Bilateral carpal tunnel syndrome 11/29/2018   Campylobacter intestinal infection 1996   Became systemic   Cellulitis    occular   Hypertension    OCD (obsessive compulsive disorder) 2007   Plantar fasciitis    seen by orthoStevie Kern    Past Surgical History:  Procedure Laterality Date   CARPAL TUNNEL RELEASE Left 03/29/2019   Procedure: CARPAL TUNNEL RELEASE ENDOSCOPIC;  Surgeon: Corky Mull, MD;  Location: [facility];  Service: Orthopedics;  Laterality: Left;   CARPAL TUNNEL RELEASE Right 05/31/2019   Procedure: CARPAL TUNNEL RELEASE ENDOSCOPIC;  Surgeon: Corky Mull, MD;  Location: [facility];  Service: Orthopedics;  Laterality: Right;   CERVICAL DISCECTOMY     C4-C5   COLONOSCOPY WITH PROPOFOL N/A 05/15/2019   Procedure: COLONOSCOPY WITH PROPOFOL;  Surgeon: Daneil Dolin, MD;  [facility];  Service: Endoscopy;  Laterality: N/A;  11:15am   EYE SURGERY N/A    [facility] 05/14/2020   POLYPECTOMY  05/15/2019   Procedure: POLYPECTOMY;  Surgeon: Daneil Dolin, MD;  [facility];  Service: Endoscopy;;   RETINAL DETACHMENT SURGERY Left 2005   SPINE SURGERY N/A    [facility] 05/14/2020    Family History  Problem Relation Age of Onset   Hypertension Father    Colon cancer Neg Hx    Colon polyps  Neg Hx     Social History   Socioeconomic History   Marital status: Married    Spouse name: Not on file   Number of children: Not on file   Years of education: Not on file   Highest education level: Not on file  Occupational History   Not on file  Tobacco Use   Smoking status: Former    Packs/day: 2.00    Years: 20.00    Pack years: 40.00    Types: Cigarettes    Quit date: 2010    Years since quitting: 13.1   Smokeless tobacco: Never  Vaping Use   Vaping Use: Never used  Substance and Sexual Activity   Alcohol use: Yes    Alcohol/week: 7.0 standard drinks    Types: 7 Standard drinks or equivalent per week    Comment: occasionally   Drug use: Yes    Types: Marijuana   Sexual activity: Yes  Other Topics Concern   Not on file  Social History Narrative   Not on file   Social Determinants of Health   Financial Resource Strain: Not on file  Food Insecurity: Not on file  Transportation Needs: Not on file  Physical Activity: Not on file  Stress: Not on file  Social Connections: Not on file  Intimate Partner Violence: Not on file  Outpatient Medications Prior to Visit  Medication Sig Dispense Refill   acetaminophen (TYLENOL) 325 MG tablet Take 650 mg by mouth every 6 (six) hours as needed.     ALPRAZolam (XANAX) 1 MG tablet TAKE 1 TABLET(1 MG) BY MOUTH TWICE DAILY AS NEEDED FOR ANXIETY 60 tablet 2   CANNABIDIOL PO Take 1 capsule by mouth daily. Full spectrum CBD Supplement     cyclobenzaprine (FLEXERIL) 10 MG tablet TAKE 1 TABLET(10 MG) BY MOUTH AT BEDTIME AS NEEDED FOR MUSCLE SPASMS 45 tablet 1   diclofenac Sodium (VOLTAREN) 1 % GEL Apply 2 g topically 4 (four) times daily as needed. (Patient taking differently: Apply 2 g topically in the morning, at noon, and at bedtime.) 300 g 3   fexofenadine (ALLEGRA) 180 MG tablet Take 180 mg by mouth daily.     GENISTEIN PO Take 1,000 mg by mouth. Take 1000 mg daily     Glycerin-Hypromellose-PEG 400 (DRY EYE RELIEF DROPS)  0.2-0.2-1 % SOLN Place 1 drop into both eyes 3 (three) times daily as needed (DRY/IRRITATED EYES.).     HYDROcodone-acetaminophen (NORCO/VICODIN) 5-325 MG tablet Take 1 tablet by mouth every 6 (six) hours as needed for moderate pain. 60 tablet 0   losartan-hydrochlorothiazide (HYZAAR) 100-12.5 MG tablet TAKE 1 TABLET DAILY 90 tablet 3   Magnesium 100 MG TABS Take by mouth.     Multiple Vitamin (MULTIVITAMIN WITH MINERALS) TABS tablet Take 1 tablet by mouth daily.     traMADol (ULTRAM) 50 MG tablet TAKE 1 TABLET(50 MG) BY MOUTH EVERY 12 HOURS AS NEEDED 60 tablet 2   vitamin E 600 UNIT capsule Take 600 Units by mouth daily.     No facility-administered medications prior to visit.    Allergies  Allergen Reactions   Milk-Related Compounds Anaphylaxis    ROS Review of Systems  Constitutional:  Negative for chills and fever.  HENT:  Negative for congestion, sinus pressure, sinus pain and sore throat.   Eyes:  Negative for pain and discharge.  Respiratory:  Negative for cough and shortness of breath.   Cardiovascular:  Negative for chest pain and palpitations.  Gastrointestinal:  Negative for abdominal pain, constipation, diarrhea, nausea and vomiting.  Endocrine: Negative for polydipsia and polyuria.  Genitourinary:  Negative for dysuria and hematuria.  Musculoskeletal:  Positive for arthralgias. Negative for neck pain and neck stiffness.  Skin:  Negative for rash.  Neurological:  Negative for dizziness, weakness, numbness and headaches.  Psychiatric/Behavioral:  Negative for agitation, behavioral problems and suicidal ideas. The patient is nervous/anxious.      Objective:    Physical Exam Vitals reviewed.  Constitutional:      General: He is not in acute distress.    Appearance: He is not diaphoretic.  HENT:     Head: Normocephalic and atraumatic.     Right Ear: Tympanic membrane and ear canal normal.     Left Ear: Tympanic membrane and ear canal normal.     Nose: Nose normal.      Mouth/Throat:     Mouth: Mucous membranes are moist.  Eyes:     General: No scleral icterus.    Extraocular Movements: Extraocular movements intact.  Neck:     Vascular: No carotid bruit.  Cardiovascular:     Rate and Rhythm: Normal rate and regular rhythm.     Pulses: Normal pulses.     Heart sounds: Normal heart sounds. No murmur heard. Pulmonary:     Breath sounds: Normal breath sounds. No wheezing or  rales.  Abdominal:     Palpations: Abdomen is soft.     Tenderness: There is no abdominal tenderness.  Musculoskeletal:     Cervical back: Neck supple. No tenderness.     Right lower leg: No edema.     Left lower leg: No edema.  Skin:    General: Skin is warm.     Findings: No rash.  Neurological:     General: No focal deficit present.     Mental Status: He is alert and oriented to person, place, and time.     Sensory: No sensory deficit.     Motor: No weakness.  Psychiatric:        Mood and Affect: Mood normal.        Behavior: Behavior normal.    BP (!) 142/88 (BP Location: Left Arm, Cuff Size: Normal)    Pulse (!) 117    Resp 18    Ht 5' 7" (1.702 m)    Wt 173 lb 12.8 oz (78.8 kg)    LMP 03/01/2017    SpO2 98%    BMI 27.22 kg/m  Wt Readings from Last 3 Encounters:  04/22/21 173 lb 12.8 oz (78.8 kg)  01/22/21 187 lb 1.9 oz (84.9 kg)  10/29/20 199 lb 1.3 oz (90.3 kg)    Lab Results  Component Value Date   TSH 0.791 10/24/2020   Lab Results  Component Value Date   WBC 6.4 10/24/2020   HGB 14.8 10/24/2020   HCT 44.7 10/24/2020   MCV 92 10/24/2020   PLT 282 10/24/2020   Lab Results  Component Value Date   NA 141 10/24/2020   K 4.9 10/24/2020   CO2 25 10/24/2020   GLUCOSE 90 10/24/2020   BUN 16 10/24/2020   CREATININE 0.74 10/24/2020   BILITOT 0.3 10/24/2020   ALKPHOS 91 10/24/2020   AST 20 10/24/2020   ALT 17 10/24/2020   PROT 6.5 10/24/2020   ALBUMIN 4.7 10/24/2020   CALCIUM 10.1 10/24/2020   EGFR 95 10/24/2020   Lab Results  Component Value  Date   CHOL 163 10/24/2020   Lab Results  Component Value Date   HDL 59 10/24/2020   Lab Results  Component Value Date   LDLCALC 82 10/24/2020   Lab Results  Component Value Date   TRIG 126 10/24/2020   Lab Results  Component Value Date   CHOLHDL 2.8 10/24/2020   Lab Results  Component Value Date   HGBA1C 5.4 10/24/2020      Assessment & Plan:   Problem List Items Addressed This Visit       Cardiovascular and Mediastinum   Essential hypertension, benign    BP Readings from Last 1 Encounters:  04/22/21 (!) 142/88  Elevated today, but usually well-controlled with Losartan-HCTZ 100-12.5 mg QD Counseled for compliance with the medications Advised DASH diet and moderate exercise/walking as tolerated      Relevant Orders   EKG 12-Lead (Completed)     Other   Preop examination - Primary    Medically optimized for right THA BP was borderline elevated today, could be due to recent stress/anxiety, usually is well controlled at home  EKG: Sinus rhythm.  No signs of active ischemia.       No orders of the defined types were placed in this encounter.   Follow-up: Return if symptoms worsen or fail to improve.    Lindell Spar, MD

## 2021-04-22 NOTE — Patient Instructions (Signed)
Please continue taking medications as prescribed.  Please contact us if your BP remains elevated more than 140/90 on 3 consecutive readings.

## 2021-04-22 NOTE — Assessment & Plan Note (Signed)
Medically optimized for right THA BP was borderline elevated today, could be due to recent stress/anxiety, usually is well controlled at home  EKG: Sinus rhythm.  No signs of active ischemia.

## 2021-04-22 NOTE — Assessment & Plan Note (Addendum)
BP Readings from Last 1 Encounters:  04/22/21 (!) 142/88   Elevated today, but usually well-controlled with Losartan-HCTZ 100-12.5 mg QD Counseled for compliance with the medications Advised DASH diet and moderate exercise/walking as tolerated

## 2021-05-11 ENCOUNTER — Other Ambulatory Visit: Payer: Self-pay | Admitting: Internal Medicine

## 2021-05-11 DIAGNOSIS — M1611 Unilateral primary osteoarthritis, right hip: Secondary | ICD-10-CM

## 2021-05-13 ENCOUNTER — Encounter: Payer: Self-pay | Admitting: Internal Medicine

## 2021-05-14 ENCOUNTER — Other Ambulatory Visit: Payer: Self-pay | Admitting: Family Medicine

## 2021-05-14 MED ORDER — HYDROCODONE-ACETAMINOPHEN 5-325MG PREPACK (~~LOC~~: ORAL_TABLET | ORAL | 0 refills | Status: DC

## 2021-05-14 MED ORDER — HYDROCODONE-ACETAMINOPHEN 5-325 MG PO TABS: ORAL_TABLET | ORAL | 0 refills | Status: DC

## 2021-05-14 NOTE — Progress Notes (Signed)
Vicodin 5 ? ?

## 2021-05-19 ENCOUNTER — Encounter: Payer: Self-pay | Admitting: Internal Medicine

## 2021-05-19 MED ORDER — HYDROCODONE-ACETAMINOPHEN 5-325 MG PO TABS: 1.0000 | ORAL_TABLET | Freq: Four times a day (QID) | ORAL | 0 refills | Status: DC | PRN

## 2021-05-29 ENCOUNTER — Encounter: Payer: Managed Care, Other (non HMO) | Admitting: Internal Medicine

## 2021-06-03 ENCOUNTER — Other Ambulatory Visit: Payer: Self-pay | Admitting: Internal Medicine

## 2021-06-03 ENCOUNTER — Encounter: Payer: Self-pay | Admitting: Internal Medicine

## 2021-06-03 DIAGNOSIS — F411 Generalized anxiety disorder: Secondary | ICD-10-CM

## 2021-06-03 DIAGNOSIS — M1611 Unilateral primary osteoarthritis, right hip: Secondary | ICD-10-CM

## 2021-06-03 MED ORDER — CYCLOBENZAPRINE HCL 10 MG PO TABS: 10.0000 mg | ORAL_TABLET | Freq: Every evening | ORAL | 1 refills | Status: DC | PRN

## 2021-06-03 MED ORDER — TRAMADOL HCL 50 MG PO TABS: 50.0000 mg | ORAL_TABLET | Freq: Two times a day (BID) | ORAL | 2 refills | Status: DC | PRN

## 2021-06-03 MED ORDER — ALPRAZOLAM 1 MG PO TABS: 1.0000 mg | ORAL_TABLET | Freq: Two times a day (BID) | ORAL | 2 refills | Status: DC | PRN

## 2021-06-09 ENCOUNTER — Encounter: Payer: Self-pay | Admitting: Internal Medicine

## 2021-06-13 ENCOUNTER — Ambulatory Visit (INDEPENDENT_AMBULATORY_CARE_PROVIDER_SITE_OTHER): Payer: Managed Care, Other (non HMO) | Admitting: Nurse Practitioner

## 2021-06-13 ENCOUNTER — Encounter: Payer: Self-pay | Admitting: Nurse Practitioner

## 2021-06-13 ENCOUNTER — Other Ambulatory Visit (HOSPITAL_COMMUNITY)
Admission: RE | Admit: 2021-06-13 | Discharge: 2021-06-13 | Disposition: A | Discharge status: Home / Self Care | Payer: Managed Care, Other (non HMO) | Source: Ambulatory Visit | Attending: Internal Medicine | Admitting: Internal Medicine

## 2021-06-13 VITALS — BP 140/82 | HR 85 | Ht 67.0 in | Wt 166.0 lb

## 2021-06-13 DIAGNOSIS — Z0000 Encounter for general adult medical examination without abnormal findings: Secondary | ICD-10-CM | POA: Insufficient documentation

## 2021-06-13 DIAGNOSIS — Z124 Encounter for screening for malignant neoplasm of cervix: Secondary | ICD-10-CM | POA: Diagnosis not present

## 2021-06-13 DIAGNOSIS — I10 Essential (primary) hypertension: Secondary | ICD-10-CM

## 2021-06-13 NOTE — Assessment & Plan Note (Signed)
Procedure well-tolerated by patient ?Smear sample sent to the lab. ? ?

## 2021-06-13 NOTE — Progress Notes (Signed)
? ?Established Patient Office Visit ? ?Subjective:  ?Patient ID: Lori Mullins, adult    DOB: 1965/05/10  Age: 56 y.o. MRN: [id number] ? ?CC:  ?Chief Complaint  ?Patient presents with  ? Annual Exam  ? Gynecologic Exam  ? ? ?HPI ?Lori Mullins with past medical history of hypertension ,chronic pain syndrome GAD obesity colonic polyps presents for PAP smear .  Last annual physical was in August 2022.  Up-to-date with mammogram.  ? ?States that she has upcoming surgery for hip arthroplasty, has been taking tramadol and Norco as needed ? ?Past Medical History:  ?Diagnosis Date  ? Anxiety   ? Arthritis   ? Hip/Hands (right)  ? Bilateral carpal tunnel syndrome 11/29/2018  ? Campylobacter intestinal infection 1996  ? Became systemic  ? Cellulitis   ? occular  ? Hypertension   ? OCD (obsessive compulsive disorder) 2007  ? Plantar fasciitis   ? seen by ortho- [place]  ? ? ?Past Surgical History:  ?Procedure Laterality Date  ? CARPAL TUNNEL RELEASE Left 03/29/2019  ? Procedure: CARPAL TUNNEL RELEASE ENDOSCOPIC;  Surgeon: Corky Mull, MD;  Location: [place];  Service: Orthopedics;  Laterality: Left;  ? CARPAL TUNNEL RELEASE Right 05/31/2019  ? Procedure: CARPAL TUNNEL RELEASE ENDOSCOPIC;  Surgeon: Corky Mull, MD;  Location: [facility];  Service: Orthopedics;  Laterality: Right;  ? CERVICAL DISCECTOMY    ? C4-C5  ? COLONOSCOPY WITH PROPOFOL N/A 05/15/2019  ? Procedure: COLONOSCOPY WITH PROPOFOL;  Surgeon: Daneil Dolin, MD;  [facility];  Service: Endoscopy;  Laterality: N/A;  11:15am  ? EYE SURGERY N/A   ? [employer]rees[employer] 05/14/2020  ? POLYPECTOMY  05/15/2019  ? Procedure: POLYPECTOMY;  Surgeon: Daneil Dolin, MD;  [facility];  Service: Endoscopy;;  ? RETINAL DETACHMENT SURGERY Left 2005  ? SPINE SURGERY N/A   ? [employer] 05/14/2020  ? ? ?Family History  ?Problem Relation Age of Onset  ? Hypertension Father   ? Colon cancer Neg Hx   ? Colon polyps Neg Hx   ? ? ?Social History  ? ?Socioeconomic  History  ? Marital status: Married  ?  Spouse name: Not on file  ? Number of children: Not on file  ? Years of education: Not on file  ? Highest education level: Not on file  ?Occupational History  ? Not on file  ?Tobacco Use  ? Smoking status: Former  ?  Packs/day: 2.00  ?  Years: 20.00  ?  Pack years: 40.00  ?  Types: Cigarettes  ?  Quit date: 2010  ?  Years since quitting: 13.2  ? Smokeless tobacco: Never  ?Vaping Use  ? Vaping Use: Never used  ?Substance and Sexual Activity  ? Alcohol use: Not Currently  ?  Alcohol/week: 7.0 standard drinks  ?  Types: 7 Standard drinks or equivalent per week  ?  Comment: occasionally  ? Drug use: Not Currently  ?  Types: Marijuana  ? Sexual activity: Yes  ?Other Topics Concern  ? Not on file  ?Social History Narrative  ? Not on file  ? ?Social Determinants of Health  ? ?Financial Resource Strain: Not on file  ?Food Insecurity: Not on file  ?Transportation Needs: Not on file  ?Physical Activity: Not on file  ?Stress: Not on file  ?Social Connections: Not on file  ?Intimate Partner Violence: Not on file  ? ? ?Outpatient Medications Prior to Visit  ?Medication Sig Dispense Refill  ? acetaminophen (  TYLENOL) 325 MG tablet Take 650 mg by mouth every 6 (six) hours as needed.    ? ALPRAZolam (XANAX) 1 MG tablet Take 1 tablet (1 mg total) by mouth 2 (two) times daily as needed for anxiety. 60 tablet 2  ? CANNABIDIOL PO Take 1 capsule by mouth daily. Full spectrum CBD Supplement    ? cyclobenzaprine (FLEXERIL) 10 MG tablet Take 1 tablet (10 mg total) by mouth at bedtime as needed for muscle spasms. 45 tablet 1  ? diclofenac Sodium (VOLTAREN) 1 % GEL Apply 2 g topically 4 (four) times daily as needed. (Patient taking differently: Apply 2 g topically in the morning, at noon, and at bedtime.) 300 g 3  ? fexofenadine (ALLEGRA) 180 MG tablet Take 180 mg by mouth daily.    ? GENISTEIN PO Take 1,000 mg by mouth. Take 1000 mg daily    ? Glycerin-Hypromellose-PEG 400 (DRY EYE RELIEF DROPS)  0.2-0.2-1 % SOLN Place 1 drop into both eyes 3 (three) times daily as needed (DRY/IRRITATED EYES.).    ? HYDROcodone-acetaminophen (NORCO/VICODIN) 5-325 MG tablet Take 1 tablet by mouth every 6 (six) hours as needed for moderate pain. 60 tablet 0  ? losartan-hydrochlorothiazide (HYZAAR) 100-12.5 MG tablet TAKE 1 TABLET DAILY 90 tablet 3  ? Multiple Vitamin (MULTIVITAMIN WITH MINERALS) TABS tablet Take 1 tablet by mouth daily.    ? traMADol (ULTRAM) 50 MG tablet Take 1 tablet (50 mg total) by mouth every 12 (twelve) hours as needed. 60 tablet 2  ? Magnesium 100 MG TABS Take by mouth. (Patient not taking: Reported on 06/13/2021)    ? vitamin E 600 UNIT capsule Take 600 Units by mouth daily. (Patient not taking: Reported on 06/13/2021)    ? ?No facility-administered medications prior to visit.  ? ? ?Allergies  ?Allergen Reactions  ? Milk-Related Compounds Anaphylaxis  ? ? ?ROS ?Review of Systems  ?Constitutional: Negative.   ?Respiratory: Negative.    ?Cardiovascular: Negative.   ?Gastrointestinal: Negative.   ?Genitourinary: Negative.   ?Musculoskeletal:  Positive for arthralgias.  ?Psychiatric/Behavioral: Negative.    ? ?  ?Objective:  ? Laural Roes CMA present as chaperone ?Physical Exam ?Exam conducted with a chaperone present.  ?Constitutional:   ?   General: He is not in acute distress. ?   Appearance: Normal appearance. He is not ill-appearing, toxic-appearing or diaphoretic.  ?Cardiovascular:  ?   Rate and Rhythm: Normal rate and regular rhythm.  ?   Pulses: Normal pulses.  ?   Heart sounds: Normal heart sounds. No murmur heard. ?  No friction rub. No gallop.  ?Pulmonary:  ?   Effort: Pulmonary effort is normal. No respiratory distress.  ?   Breath sounds: Normal breath sounds. No stridor. No wheezing, rhonchi or rales.  ?Chest:  ?   Chest wall: No mass, lacerations, deformity, swelling, tenderness or edema.  ?Breasts: ?   Breasts are symmetrical.  ?   Right: Normal. No swelling, bleeding, inverted nipple, mass,  nipple discharge, skin change or tenderness.  ?   Left: Normal. No swelling, bleeding, inverted nipple, mass, nipple discharge, skin change or tenderness.  ?Abdominal:  ?   Palpations: Abdomen is soft.  ?   Hernia: There is no hernia in the left inguinal area or right inguinal area.  ?Genitourinary: ?   General: Normal vulva.  ?   Exam position: Lithotomy position.  ?   Pubic Area: No rash or pubic lice.   ?   Tanner stage (genital): 5.  ?  Labia:     ?   Right: No rash, tenderness, lesion or injury.     ?   Left: No rash, tenderness, lesion or injury.   ?   Urethra: No prolapse, urethral pain or urethral swelling.  ?   Vagina: Normal. No signs of injury and foreign body. No vaginal discharge, erythema, tenderness, bleeding, lesions or prolapsed vaginal walls.  ?   Cervix: Dilated. No cervical motion tenderness, discharge, friability, lesion, erythema, cervical bleeding or eversion.  ?   Uterus: Normal. Not deviated, not enlarged, not fixed, not tender and no uterine prolapse.   ?   Adnexa: Right adnexa normal and left adnexa normal.    ?   Right: No mass, tenderness or fullness.      ?   Left: No mass, tenderness or fullness.    ?Musculoskeletal:  ?   Right lower leg: No edema.  ?   Left lower leg: No edema.  ?Lymphadenopathy:  ?   Upper Body:  ?   Right upper body: No supraclavicular, axillary or pectoral adenopathy.  ?   Left upper body: No supraclavicular, axillary or pectoral adenopathy.  ?   Lower Body: No right inguinal adenopathy. No left inguinal adenopathy.  ?Neurological:  ?   Mental Status: He is alert.  ?Psychiatric:     ?   Mood and Affect: Mood normal.     ?   Behavior: Behavior normal.     ?   Thought Content: Thought content normal.     ?   Judgment: Judgment normal.  ? ? ?BP 140/82 (BP Location: Left Arm, Patient Position: Sitting, Cuff Size: Large)   Pulse 85   Ht '5\' 7"'$  (1.702 m)   Wt 166 lb (75.3 kg)   LMP 03/01/2017   SpO2 96%   BMI 26.00 kg/m?  ?Wt Readings from Last 3 Encounters:   ?06/13/21 166 lb (75.3 kg)  ?04/22/21 173 lb 12.8 oz (78.8 kg)  ?01/22/21 187 lb 1.9 oz (84.9 kg)  ? ? ? ?Health Maintenance Due  ?Topic Date Due  ? Zoster Vaccines- Shingrix (1 of 2) 12/14/2013  ? PAP SMEAR-Modifier  12/20

## 2021-06-13 NOTE — Patient Instructions (Signed)

## 2021-06-13 NOTE — Assessment & Plan Note (Signed)
BP Readings from Last 3 Encounters:  ?06/13/21 140/82  ?04/22/21 (!) 142/88  ?01/22/21 132/82  ?Well-controlled on losartan-hydrochlorothiazide 100- 12.5 mg tablet daily ?DASH diet advised,  exercise at least 150 minutes weekly as tolerated ?

## 2021-06-17 LAB — CYTOLOGY - PAP
Comment: NEGATIVE
Diagnosis: NEGATIVE
High risk HPV: NEGATIVE

## 2021-06-17 NOTE — Progress Notes (Signed)
Normal PAP exam , repeat in 3 years

## 2021-07-08 ENCOUNTER — Other Ambulatory Visit: Payer: Self-pay | Admitting: Family Medicine

## 2021-08-18 ENCOUNTER — Encounter: Payer: Self-pay | Admitting: Internal Medicine

## 2021-08-28 ENCOUNTER — Encounter: Payer: Self-pay | Admitting: Internal Medicine

## 2021-08-28 ENCOUNTER — Ambulatory Visit (INDEPENDENT_AMBULATORY_CARE_PROVIDER_SITE_OTHER): Payer: Managed Care, Other (non HMO) | Admitting: Internal Medicine

## 2021-08-28 VITALS — BP 118/82 | HR 73 | Resp 18 | Ht 67.0 in | Wt 161.6 lb

## 2021-08-28 DIAGNOSIS — M1611 Unilateral primary osteoarthritis, right hip: Secondary | ICD-10-CM | POA: Diagnosis not present

## 2021-08-28 DIAGNOSIS — F411 Generalized anxiety disorder: Secondary | ICD-10-CM

## 2021-08-28 DIAGNOSIS — I10 Essential (primary) hypertension: Secondary | ICD-10-CM | POA: Diagnosis not present

## 2021-08-28 DIAGNOSIS — G894 Chronic pain syndrome: Secondary | ICD-10-CM

## 2021-08-28 MED ORDER — HYDROCODONE-ACETAMINOPHEN 5-325 MG PO TABS: 1.0000 | ORAL_TABLET | Freq: Four times a day (QID) | ORAL | 0 refills | Status: DC | PRN

## 2021-08-28 NOTE — Assessment & Plan Note (Addendum)
Due to OA of hip and knee On tramadol 50 mg twice daily as needed on weekdays On Norco as needed on weekends PDMP reviewed Tox assure today, strongly advised to avoid marijuana products

## 2021-08-28 NOTE — Assessment & Plan Note (Signed)
S/p right THA Gets steroid injections, follows up with Orthopedic surgery -[facility] Tramadol 50 mg QD PRN Norco PRN, she takes it only on weekends

## 2021-08-28 NOTE — Progress Notes (Signed)
Established Patient Office Visit  Subjective:  Patient ID: Lori Mullins, adult    DOB: 1965-05-24  Age: 56 y.o. MRN: [id number]  CC:  Chief Complaint  Patient presents with   Follow-up    Follow up she does need refill on hydrocodone     HPI Lori Mullins is a 56 y.o. adult with past medical history of HTN, OA, chronic pain syndrome, GAD, colonic polyps and obesity who presents for f/u of her chronic medical conditions.  HTN: BP is better controlled now with losartan-HCTZ. Takes medications regularly. Patient denies headache, dizziness, chest pain, dyspnea or palpitations.   GAD: Well-controlled with Xanax PRN. Denies any SI or HI.   OA and chronic pain: She has been taking Tramadol on weekdays and Norco on weekends. She also follows up with Orthopedic surgeon, now s/p right THA. She has left hip pain as well, for which she was recommended THA as well, but she wants to wait for now.   Past Medical History:  Diagnosis Date   Anxiety    Arthritis    Hip/Hands (right)   Bilateral carpal tunnel syndrome 11/29/2018   Campylobacter intestinal infection 1996   Became systemic   Cellulitis    occular   Hypertension    OCD (obsessive compulsive disorder) 2007   Plantar fasciitis    seen by orthoStevie [facility]    Past Surgical History:  Procedure Laterality Date   CARPAL TUNNEL RELEASE Left 03/29/2019   Procedure: CARPAL TUNNEL RELEASE ENDOSCOPIC;  Surgeon: Corky Mull, MD;  Location: [place];  Service: Orthopedics;  Laterality: Left;   CARPAL TUNNEL RELEASE Right 05/31/2019   Procedure: CARPAL TUNNEL RELEASE ENDOSCOPIC;  Surgeon: Corky Mull, MD;  Location: [facility];  Service: Orthopedics;  Laterality: Right;   CERVICAL DISCECTOMY     C4-C5   COLONOSCOPY WITH PROPOFOL N/A 05/15/2019   Procedure: COLONOSCOPY WITH PROPOFOL;  Surgeon: Daneil Dolin, MD;  [facility];  Service: Endoscopy;  Laterality: N/A;  11:15am   EYE SURGERY N/A    [employer] 05/14/2020    POLYPECTOMY  05/15/2019   Procedure: POLYPECTOMY;  Surgeon: Daneil Dolin, MD;  [facility];  Service: Endoscopy;;   RETINAL DETACHMENT SURGERY Left 2005   SPINE SURGERY N/A    [employer] 05/14/2020    Family History  Problem Relation Age of Onset   Hypertension Father    Colon cancer Neg Hx    Colon polyps Neg Hx     Social History   Socioeconomic History   Marital status: Married    Spouse name: Not on file   Number of children: Not on file   Years of education: Not on file   Highest education level: Not on file  Occupational History   Not on file  Tobacco Use   Smoking status: Former    Packs/day: 2.00    Years: 20.00    Total pack years: 40.00    Types: Cigarettes    Quit date: 2010    Years since quitting: 13.4   Smokeless tobacco: Never  Vaping Use   Vaping Use: Never used  Substance and Sexual Activity   Alcohol use: Not Currently    Alcohol/week: 7.0 standard drinks of alcohol    Types: 7 Standard drinks or equivalent per week    Comment: occasionally   Drug use: Not Currently    Types: Marijuana   Sexual activity: Yes  Other Topics Concern   Not on file  Social History Narrative   Not on file   Social Determinants of Health   Financial Resource Strain: Not on file  Food Insecurity: Not on file  Transportation Needs: Not on file  Physical Activity: Not on file  Stress: Not on file  Social Connections: Not on file  Intimate Partner Violence: Not on file    Outpatient Medications Prior to Visit  Medication Sig Dispense Refill   acetaminophen (TYLENOL) 325 MG tablet Take 650 mg by mouth every 6 (six) hours as needed.     ALPRAZolam (XANAX) 1 MG tablet Take 1 tablet (1 mg total) by mouth 2 (two) times daily as needed for anxiety. 60 tablet 2   CANNABIDIOL PO Take 1 capsule by mouth daily. Full spectrum CBD Supplement     cyclobenzaprine (FLEXERIL) 10 MG tablet Take 1 tablet (10 mg total) by mouth at bedtime as needed for muscle spasms.  45 tablet 1   diclofenac Sodium (VOLTAREN) 1 % GEL Apply 2 g topically 4 (four) times daily as needed. (Patient taking differently: Apply 2 g topically in the morning, at noon, and at bedtime.) 300 g 3   fexofenadine (ALLEGRA) 180 MG tablet Take 180 mg by mouth daily.     GENISTEIN PO Take 1,000 mg by mouth. Take 1000 mg daily     Glycerin-Hypromellose-PEG 400 (DRY EYE RELIEF DROPS) 0.2-0.2-1 % SOLN Place 1 drop into both eyes 3 (three) times daily as needed (DRY/IRRITATED EYES.).     losartan-hydrochlorothiazide (HYZAAR) 100-12.5 MG tablet TAKE 1 TABLET DAILY 90 tablet 3   Magnesium 100 MG TABS Take by mouth.     Multiple Vitamin (MULTIVITAMIN WITH MINERALS) TABS tablet Take 1 tablet by mouth daily.     traMADol (ULTRAM) 50 MG tablet Take 1 tablet (50 mg total) by mouth every 12 (twelve) hours as needed. 60 tablet 2   HYDROcodone-acetaminophen (NORCO/VICODIN) 5-325 MG tablet Take 1 tablet by mouth every 6 (six) hours as needed for moderate pain. 60 tablet 0   vitamin E 600 UNIT capsule Take 600 Units by mouth daily. (Patient not taking: Reported on 08/28/2021)     No facility-administered medications prior to visit.    Allergies  Allergen Reactions   Milk-Related Compounds Anaphylaxis    ROS Review of Systems  Constitutional:  Negative for chills and fever.  HENT:  Negative for congestion, sinus pressure, sinus pain and sore throat.   Eyes:  Negative for pain and discharge.  Respiratory:  Negative for cough and shortness of breath.   Cardiovascular:  Negative for chest pain and palpitations.  Gastrointestinal:  Negative for abdominal pain, constipation, diarrhea, nausea and vomiting.  Endocrine: Negative for polydipsia and polyuria.  Genitourinary:  Negative for dysuria and hematuria.  Musculoskeletal:  Positive for arthralgias. Negative for neck pain and neck stiffness.  Skin:  Negative for rash.  Neurological:  Negative for dizziness, weakness, numbness and headaches.   Psychiatric/Behavioral:  Positive for sleep disturbance. Negative for agitation, behavioral problems and suicidal ideas. The patient is nervous/anxious.       Objective:    Physical Exam Vitals reviewed.  Constitutional:      General: He is not in acute distress.    Appearance: He is not diaphoretic.  HENT:     Head: Normocephalic and atraumatic.     Nose: Nose normal.     Mouth/Throat:     Mouth: Mucous membranes are moist.  Eyes:     General: No scleral icterus.    Extraocular Movements: Extraocular movements  intact.  Neck:     Vascular: No carotid bruit.  Cardiovascular:     Rate and Rhythm: Normal rate and regular rhythm.     Pulses: Normal pulses.     Heart sounds: Normal heart sounds. No murmur heard. Pulmonary:     Breath sounds: Normal breath sounds. No wheezing or rales.  Musculoskeletal:     Cervical back: Neck supple. No tenderness.     Right lower leg: No edema.     Left lower leg: No edema.  Skin:    General: Skin is warm.     Findings: No rash.  Neurological:     General: No focal deficit present.     Mental Status: He is alert and oriented to person, place, and time.  Psychiatric:        Mood and Affect: Mood normal.        Behavior: Behavior normal.     BP 118/82 (BP Location: Right Arm, Patient Position: Sitting, Cuff Size: Normal)   Pulse 73   Resp 18   Ht _0  (1.702 m)   Wt 161 lb 9.6 oz (73.3 kg)   LMP 03/01/2017   SpO2 99%   BMI 25.31 kg/m  Wt Readings from Last 3 Encounters:  08/28/21 161 lb 9.6 oz (73.3 kg)  06/13/21 166 lb (75.3 kg)  04/22/21 173 lb 12.8 oz (78.8 kg)    Lab Results  Component Value Date   TSH 0.791 10/24/2020   Lab Results  Component Value Date   WBC 6.4 10/24/2020   HGB 14.8 10/24/2020   HCT 44.7 10/24/2020   MCV 92 10/24/2020   PLT 282 10/24/2020   Lab Results  Component Value Date   NA 141 10/24/2020   K 4.9 10/24/2020   CO2 25 10/24/2020   GLUCOSE 90 10/24/2020   BUN 16 10/24/2020    CREATININE 0.74 10/24/2020   BILITOT 0.3 10/24/2020   ALKPHOS 91 10/24/2020   AST 20 10/24/2020   ALT 17 10/24/2020   PROT 6.5 10/24/2020   ALBUMIN 4.7 10/24/2020   CALCIUM 10.1 10/24/2020   EGFR 95 10/24/2020   Lab Results  Component Value Date   CHOL 163 10/24/2020   Lab Results  Component Value Date   HDL 59 10/24/2020   Lab Results  Component Value Date   LDLCALC 82 10/24/2020   Lab Results  Component Value Date   TRIG 126 10/24/2020   Lab Results  Component Value Date   CHOLHDL 2.8 10/24/2020   Lab Results  Component Value Date   HGBA1C 5.4 10/24/2020      Assessment & Plan:   Problem List Items Addressed This Visit       Cardiovascular and Mediastinum   Essential hypertension, benign    BP Readings from Last 1 Encounters:  08/28/21 118/82  usually well-controlled with Losartan-HCTZ 100-12.5 mg QD Counseled for compliance with the medications Advised DASH diet and moderate exercise/walking as tolerated        Musculoskeletal and Integument   OA (osteoarthritis)    S/p right THA Gets steroid injections, follows up with Orthopedic surgery -[facility] Tramadol 50 mg QD PRN Norco PRN, she takes it only on weekends      Relevant Medications   HYDROcodone-acetaminophen (NORCO/VICODIN) 5-325 MG tablet     Other   GAD (generalized anxiety disorder)    Well-controlled with Xanax 1 mg BID Has tried SSRI in the past      Relevant Orders   ToxASSURE Select 13 (MW),  Urine   Chronic pain syndrome - Primary    Due to OA of hip and knee On tramadol 50 mg twice daily as needed on weekdays On Norco as needed on weekends PDMP reviewed Tox assure today, strongly advised to avoid marijuana products      Relevant Medications   HYDROcodone-acetaminophen (NORCO/VICODIN) 5-325 MG tablet   Other Relevant Orders   ToxASSURE Select 13 (MW), Urine    Meds ordered this encounter  Medications   HYDROcodone-acetaminophen (NORCO/VICODIN) 5-325 MG tablet     Sig: Take 1 tablet by mouth every 6 (six) hours as needed for moderate pain.    Dispense:  60 tablet    Refill:  0    Follow-up: Return in about 4 months (around 12/28/2021) for Anxiety.    Lindell Spar, MD

## 2021-08-28 NOTE — Patient Instructions (Signed)
Please continue taking medications as prescribed.  Please continue to follow low salt diet and ambulate as tolerated. 

## 2021-08-28 NOTE — Assessment & Plan Note (Signed)
Well-controlled with Xanax 1 mg BID Has tried SSRI in the past 

## 2021-08-28 NOTE — Assessment & Plan Note (Signed)
BP Readings from Last 1 Encounters:  08/28/21 118/82   usually well-controlled with Losartan-HCTZ 100-12.5 mg QD Counseled for compliance with the medications Advised DASH diet and moderate exercise/walking as tolerated

## 2021-08-29 ENCOUNTER — Other Ambulatory Visit: Payer: Self-pay | Admitting: Internal Medicine

## 2021-08-29 DIAGNOSIS — M1611 Unilateral primary osteoarthritis, right hip: Secondary | ICD-10-CM

## 2021-09-04 LAB — TOXASSURE SELECT 13 (MW), URINE

## 2021-09-07 ENCOUNTER — Encounter: Payer: Self-pay | Admitting: Internal Medicine

## 2021-09-08 ENCOUNTER — Other Ambulatory Visit: Payer: Self-pay | Admitting: Internal Medicine

## 2021-09-08 DIAGNOSIS — F411 Generalized anxiety disorder: Secondary | ICD-10-CM

## 2021-09-08 DIAGNOSIS — M1611 Unilateral primary osteoarthritis, right hip: Secondary | ICD-10-CM

## 2021-09-08 MED ORDER — TRAMADOL HCL 50 MG PO TABS: 50.0000 mg | ORAL_TABLET | Freq: Two times a day (BID) | ORAL | 2 refills | Status: DC | PRN

## 2021-09-08 MED ORDER — CYCLOBENZAPRINE HCL 10 MG PO TABS: 10.0000 mg | ORAL_TABLET | Freq: Every evening | ORAL | 1 refills | Status: DC | PRN

## 2021-09-08 MED ORDER — ALPRAZOLAM 1 MG PO TABS: 1.0000 mg | ORAL_TABLET | Freq: Two times a day (BID) | ORAL | 2 refills | Status: DC | PRN

## 2021-09-09 ENCOUNTER — Other Ambulatory Visit (HOSPITAL_COMMUNITY): Payer: Self-pay | Admitting: Internal Medicine

## 2021-09-09 DIAGNOSIS — Z1231 Encounter for screening mammogram for malignant neoplasm of breast: Secondary | ICD-10-CM

## 2021-10-20 ENCOUNTER — Ambulatory Visit (HOSPITAL_COMMUNITY): Payer: Managed Care, Other (non HMO)

## 2021-10-27 ENCOUNTER — Ambulatory Visit (HOSPITAL_COMMUNITY)
Admission: RE | Admit: 2021-10-27 | Discharge: 2021-10-27 | Disposition: A | Discharge status: Home / Self Care | Payer: Managed Care, Other (non HMO) | Source: Ambulatory Visit | Attending: Internal Medicine | Admitting: Internal Medicine

## 2021-10-27 DIAGNOSIS — Z1231 Encounter for screening mammogram for malignant neoplasm of breast: Secondary | ICD-10-CM | POA: Diagnosis present

## 2021-12-01 ENCOUNTER — Other Ambulatory Visit: Payer: Self-pay | Admitting: Internal Medicine

## 2021-12-01 ENCOUNTER — Encounter: Payer: Self-pay | Admitting: Internal Medicine

## 2021-12-01 DIAGNOSIS — M1611 Unilateral primary osteoarthritis, right hip: Secondary | ICD-10-CM

## 2021-12-01 MED ORDER — CYCLOBENZAPRINE HCL 10 MG PO TABS: 10.0000 mg | ORAL_TABLET | Freq: Every evening | ORAL | 1 refills | Status: DC | PRN

## 2021-12-01 MED ORDER — HYDROCODONE-ACETAMINOPHEN 5-325 MG PO TABS: 1.0000 | ORAL_TABLET | Freq: Four times a day (QID) | ORAL | 0 refills | Status: DC | PRN

## 2021-12-29 ENCOUNTER — Ambulatory Visit (INDEPENDENT_AMBULATORY_CARE_PROVIDER_SITE_OTHER): Payer: Managed Care, Other (non HMO) | Admitting: Internal Medicine

## 2021-12-29 ENCOUNTER — Encounter: Payer: Self-pay | Admitting: Internal Medicine

## 2021-12-29 VITALS — BP 132/84 | HR 93 | Resp 18 | Ht 67.0 in | Wt 157.6 lb

## 2021-12-29 DIAGNOSIS — E782 Mixed hyperlipidemia: Secondary | ICD-10-CM

## 2021-12-29 DIAGNOSIS — Z23 Encounter for immunization: Secondary | ICD-10-CM

## 2021-12-29 DIAGNOSIS — F411 Generalized anxiety disorder: Secondary | ICD-10-CM

## 2021-12-29 DIAGNOSIS — I10 Essential (primary) hypertension: Secondary | ICD-10-CM

## 2021-12-29 DIAGNOSIS — E559 Vitamin D deficiency, unspecified: Secondary | ICD-10-CM

## 2021-12-29 DIAGNOSIS — M159 Polyosteoarthritis, unspecified: Secondary | ICD-10-CM | POA: Diagnosis not present

## 2021-12-29 DIAGNOSIS — M1611 Unilateral primary osteoarthritis, right hip: Secondary | ICD-10-CM

## 2021-12-29 DIAGNOSIS — Z0001 Encounter for general adult medical examination with abnormal findings: Secondary | ICD-10-CM

## 2021-12-29 DIAGNOSIS — Z122 Encounter for screening for malignant neoplasm of respiratory organs: Secondary | ICD-10-CM

## 2021-12-29 MED ORDER — TRAMADOL HCL 50 MG PO TABS: 50.0000 mg | ORAL_TABLET | Freq: Two times a day (BID) | ORAL | 3 refills | Status: DC | PRN

## 2021-12-29 MED ORDER — ALPRAZOLAM 1 MG PO TABS: 1.0000 mg | ORAL_TABLET | Freq: Two times a day (BID) | ORAL | 3 refills | Status: DC | PRN

## 2021-12-29 NOTE — Patient Instructions (Signed)
Please continue taking medications as prescribed.  Please continue to follow low salt diet and perform moderate exercise/walking at least 150 mins/week. 

## 2021-12-29 NOTE — Progress Notes (Unsigned)
Established Patient Office Visit  Subjective:  Patient ID: Lori Mullins, adult    DOB: [birth year]  Age: 56 y.o. MRN: [id number]  CC:  Chief Complaint  Patient presents with   Follow-up    4 months follow up anxiety is about the same     HPI Lori Mullins is a 56 y.o. adult with past medical history of HTN, OA, chronic pain syndrome, GAD, colonic polyps and obesity who presents for f/u of her chronic medical conditions.       Past Medical History:  Diagnosis Date   Anxiety    Arthritis    Hip/Hands (right)   Bilateral carpal tunnel syndrome 11/29/2018   Campylobacter intestinal infection 1996   Became systemic   Cellulitis    occular   Hypertension    OCD (obsessive compulsive disorder) 2007   Plantar fasciitis    seen by orthoStevie Kern    Past Surgical History:  Procedure Laterality Date   CARPAL TUNNEL RELEASE Left 03/29/2019   Procedure: CARPAL TUNNEL RELEASE ENDOSCOPIC;  Surgeon: Corky Mull, MD;  Location: [place];  Service: Orthopedics;  Laterality: Left;   CARPAL TUNNEL RELEASE Right 05/31/2019   Procedure: CARPAL TUNNEL RELEASE ENDOSCOPIC;  Surgeon: Corky Mull, MD;  Location: [facility];  Service: Orthopedics;  Laterality: Right;   CERVICAL DISCECTOMY     C4-C5   COLONOSCOPY WITH PROPOFOL N/A 05/15/2019   Procedure: COLONOSCOPY WITH PROPOFOL;  Surgeon: Daneil Dolin, MD;  [facility];  Service: Endoscopy;  Laterality: N/A;  11:15am   EYE SURGERY N/A    [facility] 05/14/2020   POLYPECTOMY  05/15/2019   Procedure: POLYPECTOMY;  Surgeon: Daneil Dolin, MD;  [facility];  Service: Endoscopy;;   RETINAL DETACHMENT SURGERY Left 2005   SPINE SURGERY N/A    [facility] 05/14/2020    Family History  Problem Relation Age of Onset   Hypertension Father    Colon cancer Neg Hx    Colon polyps Neg Hx     Social History   Socioeconomic History   Marital status: Married    Spouse name: Not on file   Number of children:  Not on file   Years of education: Not on file   Highest education level: Not on file  Occupational History   Not on file  Tobacco Use   Smoking status: Former    Packs/day: 2.00    Years: 20.00    Total pack years: 40.00    Types: Cigarettes    Quit date: 2010    Years since quitting: 13.8   Smokeless tobacco: Never  Vaping Use   Vaping Use: Never used  Substance and Sexual Activity   Alcohol use: Not Currently    Alcohol/week: 7.0 standard drinks of alcohol    Types: 7 Standard drinks or equivalent per week    Comment: occasionally   Drug use: Not Currently    Types: Marijuana   Sexual activity: Yes  Other Topics Concern   Not on file  Social History Narrative   Not on file   Social Determinants of Health   Financial Resource Strain: Not on file  Food Insecurity: Not on file  Transportation Needs: Not on file  Physical Activity: Not on file  Stress: Not on file  Social Connections: Not on file  Intimate Partner Violence: Not on file    Outpatient Medications Prior to Visit  Medication Sig Dispense Refill   acetaminophen (TYLENOL) 325 MG tablet  Take 650 mg by mouth every 6 (six) hours as needed.     ALPRAZolam (XANAX) 1 MG tablet Take 1 tablet (1 mg total) by mouth 2 (two) times daily as needed for anxiety. 60 tablet 2   CANNABIDIOL PO Take 1 capsule by mouth daily. Full spectrum CBD Supplement     cyclobenzaprine (FLEXERIL) 10 MG tablet Take 1 tablet (10 mg total) by mouth at bedtime as needed for muscle spasms. 90 tablet 1   diclofenac Sodium (VOLTAREN) 1 % GEL Apply 2 g topically 4 (four) times daily as needed. (Patient taking differently: Apply 2 g topically in the morning, at noon, and at bedtime.) 300 g 3   fexofenadine (ALLEGRA) 180 MG tablet Take 180 mg by mouth daily.     GENISTEIN PO Take 1,000 mg by mouth. Take 1000 mg daily     Glycerin-Hypromellose-PEG 400 (DRY EYE RELIEF DROPS) 0.2-0.2-1 % SOLN Place 1 drop into both eyes 3 (three) times daily as needed  (DRY/IRRITATED EYES.).     HYDROcodone-acetaminophen (NORCO/VICODIN) 5-325 MG tablet Take 1 tablet by mouth every 6 (six) hours as needed for moderate pain. 60 tablet 0   losartan-hydrochlorothiazide (HYZAAR) 100-12.5 MG tablet TAKE 1 TABLET DAILY 90 tablet 3   Magnesium 100 MG TABS Take by mouth.     Multiple Vitamin (MULTIVITAMIN WITH MINERALS) TABS tablet Take 1 tablet by mouth daily.     traMADol (ULTRAM) 50 MG tablet Take 1 tablet (50 mg total) by mouth every 12 (twelve) hours as needed. 60 tablet 2   No facility-administered medications prior to visit.    Allergies  Allergen Reactions   Milk-Related Compounds Anaphylaxis    ROS Review of Systems    Objective:    Physical Exam  BP 132/84 (BP Location: Left Arm, Patient Position: Sitting, Cuff Size: Normal)   Pulse 93   Resp 18   Ht _0  (1.702 m)   Wt 157 lb 9.6 oz (71.5 kg)   LMP 03/01/2017   SpO2 98%   BMI 24.68 kg/m  Wt Readings from Last 3 Encounters:  12/29/21 157 lb 9.6 oz (71.5 kg)  08/28/21 161 lb 9.6 oz (73.3 kg)  06/13/21 166 lb (75.3 kg)    Lab Results  Component Value Date   TSH 0.791 10/24/2020   Lab Results  Component Value Date   WBC 6.4 10/24/2020   HGB 14.8 10/24/2020   HCT 44.7 10/24/2020   MCV 92 10/24/2020   PLT 282 10/24/2020   Lab Results  Component Value Date   NA 141 10/24/2020   K 4.9 10/24/2020   CO2 25 10/24/2020   GLUCOSE 90 10/24/2020   BUN 16 10/24/2020   CREATININE 0.74 10/24/2020   BILITOT 0.3 10/24/2020   ALKPHOS 91 10/24/2020   AST 20 10/24/2020   ALT 17 10/24/2020   PROT 6.5 10/24/2020   ALBUMIN 4.7 10/24/2020   CALCIUM 10.1 10/24/2020   EGFR 95 10/24/2020   Lab Results  Component Value Date   CHOL 163 10/24/2020   Lab Results  Component Value Date   HDL 59 10/24/2020   Lab Results  Component Value Date   LDLCALC 82 10/24/2020   Lab Results  Component Value Date   TRIG 126 10/24/2020   Lab Results  Component Value Date   CHOLHDL 2.8  10/24/2020   Lab Results  Component Value Date   HGBA1C 5.4 10/24/2020      Assessment & Plan:   Problem List Items Addressed This Visit  None Visit Diagnoses     Need for immunization against influenza    -  Primary   Relevant Orders   Flu Vaccine QUAD 79moIM (Fluarix, Fluzone & Alfiuria Quad PF) (Completed)       No orders of the defined types were placed in this encounter.   Follow-up: No follow-ups on file.    RLindell Spar MD

## 2021-12-30 DIAGNOSIS — Z122 Encounter for screening for malignant neoplasm of respiratory organs: Secondary | ICD-10-CM | POA: Insufficient documentation

## 2021-12-30 LAB — LIPID PANEL
Chol/HDL Ratio: 2.3 ratio (ref 0.0–4.4)
Cholesterol, Total: 159 mg/dL (ref 100–199)
HDL: 69 mg/dL (ref >39)
LDL Chol Calc (NIH): 66 mg/dL (ref 0–99)
Triglycerides: 141 mg/dL (ref 0–149)
VLDL Cholesterol Cal: 24 mg/dL (ref 5–40)

## 2021-12-30 LAB — CMP14+EGFR
ALT: 11 IU/L (ref 0–32)
AST: 14 IU/L (ref 0–40)
Albumin/Globulin Ratio: 2.4 — ABNORMAL HIGH (ref 1.2–2.2)
Albumin: 4.6 g/dL (ref 3.8–4.9)
Alkaline Phosphatase: 91 IU/L (ref 44–121)
BUN/Creatinine Ratio: 14 (ref 9–23)
BUN: 9 mg/dL (ref 6–24)
Bilirubin Total: 0.3 mg/dL (ref 0.0–1.2)
CO2: 23 mmol/L (ref 20–29)
Calcium: 9.6 mg/dL (ref 8.7–10.2)
Chloride: 102 mmol/L (ref 96–106)
Creatinine, Ser: 0.66 mg/dL (ref 0.57–1.00)
Globulin, Total: 1.9 g/dL (ref 1.5–4.5)
Glucose: 84 mg/dL (ref 70–99)
Potassium: 3.8 mmol/L (ref 3.5–5.2)
Sodium: 142 mmol/L (ref 134–144)
Total Protein: 6.5 g/dL (ref 6.0–8.5)
eGFR: 102 mL/min/{1.73_m2} (ref >59)

## 2021-12-30 LAB — CBC WITH DIFFERENTIAL/PLATELET
Basophils Absolute: 0.1 10*3/uL (ref 0.0–0.2)
Basos: 1 %
EOS (ABSOLUTE): 0.1 10*3/uL (ref 0.0–0.4)
Eos: 1 %
Hematocrit: 38.4 % (ref 34.0–46.6)
Hemoglobin: 13.1 g/dL (ref 11.1–15.9)
Immature Grans (Abs): 0 10*3/uL (ref 0.0–0.1)
Immature Granulocytes: 0 %
Lymphocytes Absolute: 2.6 10*3/uL (ref 0.7–3.1)
Lymphs: 35 %
MCH: 30.5 pg (ref 26.6–33.0)
MCHC: 34.1 g/dL (ref 31.5–35.7)
MCV: 89 fL (ref 79–97)
Monocytes Absolute: 0.5 10*3/uL (ref 0.1–0.9)
Monocytes: 7 %
Neutrophils Absolute: 4.2 10*3/uL (ref 1.4–7.0)
Neutrophils: 56 %
Platelets: 270 10*3/uL (ref 150–450)
RBC: 4.3 x10E6/uL (ref 3.77–5.28)
RDW: 12.6 % (ref 11.7–15.4)
WBC: 7.5 10*3/uL (ref 3.4–10.8)

## 2021-12-30 LAB — VITAMIN D 25 HYDROXY (VIT D DEFICIENCY, FRACTURES): Vit D, 25-Hydroxy: 37.6 ng/mL (ref 30.0–100.0)

## 2021-12-30 LAB — TSH: TSH: 2.08 u[IU]/mL (ref 0.450–4.500)

## 2021-12-30 NOTE — Assessment & Plan Note (Signed)
Well-controlled with Xanax 1 mg BID Has tried SSRI in the past

## 2021-12-30 NOTE — Assessment & Plan Note (Signed)
Quit smoking in 2000, has 40 pack-year smoking history Low-dose CT chest ordered

## 2021-12-30 NOTE — Assessment & Plan Note (Signed)
BP Readings from Last 1 Encounters:  12/29/21 132/84   usually well-controlled with Losartan-HCTZ 100-12.5 mg QD Counseled for compliance with the medications Advised DASH diet and moderate exercise/walking as tolerated

## 2021-12-30 NOTE — Assessment & Plan Note (Signed)
S/p right THA Gets steroid injections, follows up with Orthopedic surgery -[facility] Tramadol 50 mg QD PRN Norco PRN, she takes it only on weekends Has been advised to avoid Marijuana use while on opioids and BZD

## 2021-12-30 NOTE — Assessment & Plan Note (Signed)
Physical exam as documented. Fasting blood tests ordered. 

## 2022-01-15 ENCOUNTER — Ambulatory Visit (HOSPITAL_COMMUNITY)
Admission: RE | Admit: 2022-01-15 | Discharge: 2022-01-15 | Disposition: A | Discharge status: Home / Self Care | Payer: Managed Care, Other (non HMO) | Source: Ambulatory Visit | Attending: Internal Medicine | Admitting: Internal Medicine

## 2022-01-15 DIAGNOSIS — Z122 Encounter for screening for malignant neoplasm of respiratory organs: Secondary | ICD-10-CM | POA: Diagnosis present

## 2022-01-26 ENCOUNTER — Encounter: Payer: Self-pay | Admitting: Internal Medicine

## 2022-01-27 ENCOUNTER — Other Ambulatory Visit: Payer: Self-pay

## 2022-01-27 DIAGNOSIS — M1611 Unilateral primary osteoarthritis, right hip: Secondary | ICD-10-CM

## 2022-01-27 MED ORDER — CYCLOBENZAPRINE HCL 10 MG PO TABS: 10.0000 mg | ORAL_TABLET | Freq: Every evening | ORAL | 0 refills | Status: DC | PRN

## 2022-02-23 ENCOUNTER — Other Ambulatory Visit: Payer: Self-pay | Admitting: Internal Medicine

## 2022-02-23 DIAGNOSIS — I10 Essential (primary) hypertension: Secondary | ICD-10-CM

## 2022-04-13 ENCOUNTER — Encounter: Payer: Self-pay | Admitting: Internal Medicine

## 2022-04-13 ENCOUNTER — Other Ambulatory Visit: Payer: Self-pay | Admitting: Internal Medicine

## 2022-04-13 DIAGNOSIS — M1611 Unilateral primary osteoarthritis, right hip: Secondary | ICD-10-CM

## 2022-04-14 ENCOUNTER — Encounter: Payer: Self-pay | Admitting: *Deleted

## 2022-04-14 ENCOUNTER — Other Ambulatory Visit: Payer: Self-pay | Admitting: Internal Medicine

## 2022-04-14 DIAGNOSIS — M1611 Unilateral primary osteoarthritis, right hip: Secondary | ICD-10-CM

## 2022-04-14 DIAGNOSIS — F411 Generalized anxiety disorder: Secondary | ICD-10-CM

## 2022-04-14 MED ORDER — TRAMADOL HCL 50 MG PO TABS: 50.0000 mg | ORAL_TABLET | Freq: Two times a day (BID) | ORAL | 3 refills | Status: DC | PRN

## 2022-04-14 MED ORDER — ALPRAZOLAM 1 MG PO TABS: 1.0000 mg | ORAL_TABLET | Freq: Two times a day (BID) | ORAL | 3 refills | Status: DC | PRN

## 2022-04-14 MED ORDER — CYCLOBENZAPRINE HCL 10 MG PO TABS: 10.0000 mg | ORAL_TABLET | Freq: Every evening | ORAL | 0 refills | Status: DC | PRN

## 2022-04-30 ENCOUNTER — Telehealth (INDEPENDENT_AMBULATORY_CARE_PROVIDER_SITE_OTHER): Payer: Self-pay | Admitting: *Deleted

## 2022-04-30 DIAGNOSIS — Z86010 Personal history of colonic polyps: Secondary | ICD-10-CM

## 2022-04-30 NOTE — Telephone Encounter (Signed)
  Procedure: recall TCS  Height: 5 7 Weight: 160lbs  Have you had a colonoscopy before?  05/15/19, Dr. [name]  Do you have family history of colon cancer?  no  Do you have a family history of polyps? no  Previous colonoscopy with polyps removed? yes  Do you have a history colorectal cancer?   no  Are you diabetic?  no  Do you have a prosthetic or mechanical heart valve? No  Do you have a pacemaker/defibrillator?   no  Have you had endocarditis/atrial fibrillation?  no  Do you use supplemental oxygen/CPAP?  no  Have you had joint replacement within the last 12 months?  yes  Do you tend to be constipated or have to use laxatives?  no   Do you have history of alcohol use? If yes, how much and how often.  no  Do you have history or are you using drugs? If yes, what do are you  using?  no  Have you ever had a stroke/heart attack?    Have you ever had a heart or other vascular stent placed,?  Do you take weight loss medication? no  female patients,: have you had a hysterectomy? no                              are you post menopausal?  yes                              do you still have your menstrual cycle? no     Do you take any blood-thinning medications such as: (Plavix, aspirin, Coumadin, Aggrenox, Brilinta, Xarelto, Eliquis, Pradaxa, Savaysa or Effient)? no  If yes we need the name, milligram, dosage and who is prescribing doctor:               Current Outpatient Medications  Medication Sig Dispense Refill   acetaminophen (TYLENOL) 325 MG tablet Take 650 mg by mouth every 6 (six) hours as needed.     ALPRAZolam (XANAX) 1 MG tablet Take 1 tablet (1 mg total) by mouth 2 (two) times daily as needed for anxiety. 60 tablet 3   CANNABIDIOL PO Take 1 capsule by mouth daily. Full spectrum CBD Supplement     cyclobenzaprine (FLEXERIL) 10 MG tablet Take 1 tablet (10 mg total) by mouth at bedtime as needed for muscle spasms. 90 tablet 0   fexofenadine (ALLEGRA) 180 MG tablet  Take 180 mg by mouth daily.     Glycerin-Hypromellose-PEG 400 (DRY EYE RELIEF DROPS) 0.2-0.2-1 % SOLN Place 1 drop into both eyes 3 (three) times daily as needed (DRY/IRRITATED EYES.).     losartan-hydrochlorothiazide (HYZAAR) 100-12.5 MG tablet TAKE 1 TABLET DAILY 90 tablet 3   Multiple Vitamin (MULTIVITAMIN WITH MINERALS) TABS tablet Take 1 tablet by mouth daily.     traMADol (ULTRAM) 50 MG tablet Take 1 tablet (50 mg total) by mouth every 12 (twelve) hours as needed. 60 tablet 3   No current facility-administered medications for this visit.    Allergies  Allergen Reactions   Milk-Related Compounds Anaphylaxis

## 2022-05-01 NOTE — Telephone Encounter (Signed)
Ok to schedule. ASA 2. Needs BMET.

## 2022-05-05 NOTE — Telephone Encounter (Signed)
Spoke with pt. She has been scheduled for 4/29 at 7:30am. Aware will send instructions to her. Advised was going to send prep to pharmacy but stated " I will do dulcolax and gatorade, I'm not drinking all that shit". I stated she can't do just dulcolax. She said "well I will do miralax/dulcolax and gatorade." I advised we don't recommend that as patients are not always cleaned out and if she is not, then she would have to do it again. She stated that is fine with her, she will do this and if we don't like it she can find another doctor. Then she asked if her insurance was going to cover this 100%. Advised she has hx polyps and as to what her insurance will or will not pay, she will need to find out from her insurance and also the pre service center will call her once they check her benefits. She stated " just know if they don't pay I am cancelling". Advised pt have a great day and will send her information in the mail.   FYI Dr. [name]

## 2022-05-05 NOTE — Addendum Note (Signed)
Addended by: Cheron Every on: 05/05/2022 02:12 PM   Modules accepted: Orders

## 2022-05-05 NOTE — Telephone Encounter (Signed)
I did miralax prep instructions and sent to patient with her having to do 2 full days of clear liquids.

## 2022-05-06 ENCOUNTER — Ambulatory Visit: Payer: Managed Care, Other (non HMO) | Admitting: Internal Medicine

## 2022-05-06 ENCOUNTER — Encounter: Payer: Self-pay | Admitting: Internal Medicine

## 2022-05-06 VITALS — BP 134/88 | HR 80 | Ht 67.0 in | Wt 158.8 lb

## 2022-05-06 DIAGNOSIS — I10 Essential (primary) hypertension: Secondary | ICD-10-CM

## 2022-05-06 DIAGNOSIS — J432 Centrilobular emphysema: Secondary | ICD-10-CM | POA: Insufficient documentation

## 2022-05-06 DIAGNOSIS — M159 Polyosteoarthritis, unspecified: Secondary | ICD-10-CM

## 2022-05-06 DIAGNOSIS — M1611 Unilateral primary osteoarthritis, right hip: Secondary | ICD-10-CM

## 2022-05-06 DIAGNOSIS — F411 Generalized anxiety disorder: Secondary | ICD-10-CM

## 2022-05-06 DIAGNOSIS — I700 Atherosclerosis of aorta: Secondary | ICD-10-CM | POA: Diagnosis not present

## 2022-05-06 MED ORDER — ALPRAZOLAM 1 MG PO TABS: 1.0000 mg | ORAL_TABLET | Freq: Two times a day (BID) | ORAL | 3 refills | Status: DC | PRN

## 2022-05-06 MED ORDER — TRAMADOL HCL 50 MG PO TABS: 50.0000 mg | ORAL_TABLET | Freq: Two times a day (BID) | ORAL | 3 refills | Status: DC | PRN

## 2022-05-06 NOTE — Assessment & Plan Note (Signed)
Well-controlled with Xanax 1 mg BID Has tried SSRI in the past, but did not help

## 2022-05-06 NOTE — Assessment & Plan Note (Addendum)
Noted on CT chest Currently asymptomatic Has quit smoking

## 2022-05-06 NOTE — Patient Instructions (Signed)
Please continue to take medications as prescribed.  Please continue to follow low salt diet and perform moderate exercise/walking at least 150 mins/week.

## 2022-05-06 NOTE — Assessment & Plan Note (Addendum)
S/p right THA Gets steroid injections, follows up with Orthopedic surgery -[facility] Tramadol 50 mg BID PRN Has been advised to avoid Marijuana use while on opioids and BZD

## 2022-05-06 NOTE — Assessment & Plan Note (Signed)
Noted on CT chest Lipid profile reviewed - wnl

## 2022-05-06 NOTE — Assessment & Plan Note (Addendum)
BP Readings from Last 1 Encounters:  05/06/22 134/88   usually well-controlled with Losartan-HCTZ 100-12.5 mg QD Counseled for compliance with the medications Advised DASH diet and moderate exercise/walking as tolerated

## 2022-05-06 NOTE — Progress Notes (Signed)
Established Patient Office Visit  Subjective:  Patient ID: Lori Mullins, adult    DOB: 04/06/1965  Age: 57 y.o. MRN: [id number]  CC:  Chief Complaint  Patient presents with   Follow-up    HPI Lori Mullins is a 57 y.o. adult with past medical history of HTN, OA, chronic pain syndrome, GAD, colonic polyps and obesity who presents for f/u of her chronic medical conditions.  HTN: BP is better controlled now with losartan-HCTZ. Takes medications regularly. Patient denies headache, dizziness, chest pain, dyspnea or palpitations.   GAD: Well-controlled with Xanax PRN. Denies any SI or HI.   OA and chronic pain: She has been taking Tramadol for chronic hip pain. She also follows up with Orthopedic surgeon, now s/p right THA. She has left hip pain as well, for which she was recommended THA as well, but she wants to wait for now.  Discussed CT chest results with the patient in detail.  Past Medical History:  Diagnosis Date   Anxiety    Arthritis    Hip/Hands (right)   Bilateral carpal tunnel syndrome 11/29/2018   Campylobacter intestinal infection 1996   Became systemic   Cellulitis    occular   Hypertension    OCD (obsessive compulsive disorder) 2007   Plantar fasciitis    seen by orthoStevie Kern    Past Surgical History:  Procedure Laterality Date   CARPAL TUNNEL RELEASE Left 03/29/2019   Procedure: CARPAL TUNNEL RELEASE ENDOSCOPIC;  Surgeon: Corky Mull, MD;  Location: [place];  Service: Orthopedics;  Laterality: Left;   CARPAL TUNNEL RELEASE Right 05/31/2019   Procedure: CARPAL TUNNEL RELEASE ENDOSCOPIC;  Surgeon: Corky Mull, MD;  Location: [facility];  Service: Orthopedics;  Laterality: Right;   CERVICAL DISCECTOMY     C4-C5   COLONOSCOPY WITH PROPOFOL N/A 05/15/2019   Procedure: COLONOSCOPY WITH PROPOFOL;  Surgeon: Daneil Dolin, MD;  [facility];  Service: Endoscopy;  Laterality: N/A;  11:15am   EYE SURGERY N/A    [facility] 05/14/2020    POLYPECTOMY  05/15/2019   Procedure: POLYPECTOMY;  Surgeon: Daneil Dolin, MD;  [facility];  Service: Endoscopy;;   RETINAL DETACHMENT SURGERY Left 2005   SPINE SURGERY N/A    [facility] 05/14/2020    Family History  Problem Relation Age of Onset   Hypertension Father    Colon cancer Neg Hx    Colon polyps Neg Hx     Social History   Socioeconomic History   Marital status: Married    Spouse name: Not on file   Number of children: Not on file   Years of education: Not on file   Highest education level: Not on file  Occupational History   Not on file  Tobacco Use   Smoking status: Former    Packs/day: 2.00    Years: 20.00    Total pack years: 40.00    Types: Cigarettes    Quit date: 2010    Years since quitting: 14.1   Smokeless tobacco: Never  Vaping Use   Vaping Use: Never used  Substance and Sexual Activity   Alcohol use: Not Currently    Alcohol/week: 7.0 standard drinks of alcohol    Types: 7 Standard drinks or equivalent per week    Comment: occasionally   Drug use: Not Currently    Types: Marijuana   Sexual activity: Yes  Other Topics Concern   Not on file  Social History Narrative  Not on file   Social Determinants of Health   Financial Resource Strain: Not on file  Food Insecurity: Not on file  Transportation Needs: Not on file  Physical Activity: Not on file  Stress: Not on file  Social Connections: Not on file  Intimate Partner Violence: Not on file    Outpatient Medications Prior to Visit  Medication Sig Dispense Refill   acetaminophen (TYLENOL) 325 MG tablet Take 650 mg by mouth every 6 (six) hours as needed.     CANNABIDIOL PO Take 1 capsule by mouth daily. Full spectrum CBD Supplement     cyclobenzaprine (FLEXERIL) 10 MG tablet Take 1 tablet (10 mg total) by mouth at bedtime as needed for muscle spasms. 90 tablet 0   fexofenadine (ALLEGRA) 180 MG tablet Take 180 mg by mouth daily.     Glycerin-Hypromellose-PEG 400 (DRY EYE  RELIEF DROPS) 0.2-0.2-1 % SOLN Place 1 drop into both eyes 3 (three) times daily as needed (DRY/IRRITATED EYES.).     losartan-hydrochlorothiazide (HYZAAR) 100-12.5 MG tablet TAKE 1 TABLET DAILY 90 tablet 3   Multiple Vitamin (MULTIVITAMIN WITH MINERALS) TABS tablet Take 1 tablet by mouth daily.     ALPRAZolam (XANAX) 1 MG tablet Take 1 tablet (1 mg total) by mouth 2 (two) times daily as needed for anxiety. 60 tablet 3   traMADol (ULTRAM) 50 MG tablet Take 1 tablet (50 mg total) by mouth every 12 (twelve) hours as needed. 60 tablet 3   No facility-administered medications prior to visit.    Allergies  Allergen Reactions   Milk-Related Compounds Anaphylaxis    ROS Review of Systems  Constitutional:  Negative for chills and fever.  HENT:  Negative for congestion, sinus pressure, sinus pain and sore throat.   Eyes:  Negative for pain and discharge.  Respiratory:  Negative for cough and shortness of breath.   Cardiovascular:  Negative for chest pain and palpitations.  Gastrointestinal:  Negative for abdominal pain, constipation, diarrhea, nausea and vomiting.  Endocrine: Negative for polydipsia and polyuria.  Genitourinary:  Negative for dysuria and hematuria.  Musculoskeletal:  Positive for arthralgias. Negative for neck pain and neck stiffness.  Skin:  Negative for rash.  Neurological:  Negative for dizziness, weakness, numbness and headaches.  Psychiatric/Behavioral:  Positive for sleep disturbance. Negative for agitation, behavioral problems and suicidal ideas. The patient is nervous/anxious.       Objective:    Physical Exam Vitals reviewed.  Constitutional:      General: He is not in acute distress.    Appearance: He is not diaphoretic.  HENT:     Head: Normocephalic and atraumatic.     Nose: Nose normal.     Mouth/Throat:     Mouth: Mucous membranes are moist.  Eyes:     General: No scleral icterus.    Extraocular Movements: Extraocular movements intact.  Neck:      Vascular: No carotid bruit.  Cardiovascular:     Rate and Rhythm: Normal rate and regular rhythm.     Pulses: Normal pulses.     Heart sounds: Normal heart sounds. No murmur heard. Pulmonary:     Breath sounds: Normal breath sounds. No wheezing or rales.  Musculoskeletal:     Cervical back: Neck supple. No tenderness.     Right lower leg: No edema.     Left lower leg: No edema.  Skin:    General: Skin is warm.     Findings: No rash.  Neurological:     General: No  focal deficit present.     Mental Status: He is alert and oriented to person, place, and time.  Psychiatric:        Mood and Affect: Mood normal.        Behavior: Behavior normal.     BP 134/88 (BP Location: Right Arm, Cuff Size: Normal)   Pulse 80   Ht '5\' 7"'$  (1.702 m)   Wt 158 lb 12.8 oz (72 kg)   LMP 03/01/2017   SpO2 97%   BMI 24.87 kg/m  Wt Readings from Last 3 Encounters:  05/06/22 158 lb 12.8 oz (72 kg)  12/29/21 157 lb 9.6 oz (71.5 kg)  08/28/21 161 lb 9.6 oz (73.3 kg)    Lab Results  Component Value Date   TSH 2.080 12/29/2021   Lab Results  Component Value Date   WBC 7.5 12/29/2021   HGB 13.1 12/29/2021   HCT 38.4 12/29/2021   MCV 89 12/29/2021   PLT 270 12/29/2021   Lab Results  Component Value Date   NA 142 12/29/2021   K 3.8 12/29/2021   CO2 23 12/29/2021   GLUCOSE 84 12/29/2021   BUN 9 12/29/2021   CREATININE 0.66 12/29/2021   BILITOT 0.3 12/29/2021   ALKPHOS 91 12/29/2021   AST 14 12/29/2021   ALT 11 12/29/2021   PROT 6.5 12/29/2021   ALBUMIN 4.6 12/29/2021   CALCIUM 9.6 12/29/2021   EGFR 102 12/29/2021   Lab Results  Component Value Date   CHOL 159 12/29/2021   Lab Results  Component Value Date   HDL 69 12/29/2021   Lab Results  Component Value Date   LDLCALC 66 12/29/2021   Lab Results  Component Value Date   TRIG 141 12/29/2021   Lab Results  Component Value Date   CHOLHDL 2.3 12/29/2021   Lab Results  Component Value Date   HGBA1C 5.4 10/24/2020       Assessment & Plan:   Problem List Items Addressed This Visit       Cardiovascular and Mediastinum   Essential hypertension, benign - Primary    BP Readings from Last 1 Encounters:  05/06/22 134/88  usually well-controlled with Losartan-HCTZ 100-12.5 mg QD Counseled for compliance with the medications Advised DASH diet and moderate exercise/walking as tolerated      Aortic atherosclerosis ([place])    Noted on CT chest Lipid profile reviewed - wnl        Respiratory   Centrilobular emphysema ([facility])    Noted on CT chest Currently asymptomatic Has quit smoking        Musculoskeletal and Integument   OA (osteoarthritis)    S/p right THA Gets steroid injections, follows up with Orthopedic surgery -[facility] Tramadol 50 mg BID PRN Has been advised to avoid Marijuana use while on opioids and BZD      Relevant Medications   traMADol (ULTRAM) 50 MG tablet     Other   GAD (generalized anxiety disorder)    Well-controlled with Xanax 1 mg BID Has tried SSRI in the past, but did not help      Relevant Medications   ALPRAZolam (XANAX) 1 MG tablet    Meds ordered this encounter  Medications   ALPRAZolam (XANAX) 1 MG tablet    Sig: Take 1 tablet (1 mg total) by mouth 2 (two) times daily as needed for anxiety.    Dispense:  60 tablet    Refill:  3   traMADol (ULTRAM) 50 MG tablet    Sig: Take  1 tablet (50 mg total) by mouth every 12 (twelve) hours as needed.    Dispense:  60 tablet    Refill:  3    Follow-up: Return in about 4 months (around 09/05/2022).    Lindell Spar, MD

## 2022-05-18 ENCOUNTER — Telehealth (INDEPENDENT_AMBULATORY_CARE_PROVIDER_SITE_OTHER): Payer: Self-pay | Admitting: Internal Medicine

## 2022-05-18 NOTE — Telephone Encounter (Signed)
Pt left voicemail in regards to billing. Pt has upcoming TCS with Dr.Rourk. Contacted pt and pt asked "are you from the billing department?" Writed stated no but I am going to give you a number to contact. Gave pt Pre service number to contact.

## 2022-05-18 NOTE — Telephone Encounter (Signed)
Pt left voicemail wanting to cancel TCS for 06/29/22. Returned call to pt and she has cancelled TCS

## 2022-06-08 ENCOUNTER — Encounter: Payer: Self-pay | Admitting: Internal Medicine

## 2022-06-29 ENCOUNTER — Ambulatory Visit (HOSPITAL_COMMUNITY)
Admission: RE | Admit: 2022-06-29 | Payer: Managed Care, Other (non HMO) | Source: Home / Self Care | Admitting: Internal Medicine

## 2022-06-29 ENCOUNTER — Encounter (HOSPITAL_COMMUNITY): Admission: RE | Payer: Self-pay | Source: Home / Self Care

## 2022-06-29 SURGERY — COLONOSCOPY WITH PROPOFOL
Anesthesia: Monitor Anesthesia Care

## 2022-08-25 ENCOUNTER — Encounter: Payer: Self-pay | Admitting: Internal Medicine

## 2022-08-26 ENCOUNTER — Other Ambulatory Visit: Payer: Self-pay

## 2022-08-26 DIAGNOSIS — M1611 Unilateral primary osteoarthritis, right hip: Secondary | ICD-10-CM

## 2022-08-26 MED ORDER — CYCLOBENZAPRINE HCL 10 MG PO TABS: 10.0000 mg | ORAL_TABLET | Freq: Every evening | ORAL | 0 refills | Status: DC | PRN

## 2022-09-07 ENCOUNTER — Encounter: Payer: Self-pay | Admitting: Internal Medicine

## 2022-09-07 ENCOUNTER — Ambulatory Visit: Payer: Managed Care, Other (non HMO) | Admitting: Internal Medicine

## 2022-09-07 VITALS — BP 131/80 | HR 92 | Ht 67.0 in | Wt 169.2 lb

## 2022-09-07 DIAGNOSIS — M1611 Unilateral primary osteoarthritis, right hip: Secondary | ICD-10-CM

## 2022-09-07 DIAGNOSIS — J432 Centrilobular emphysema: Secondary | ICD-10-CM | POA: Diagnosis not present

## 2022-09-07 DIAGNOSIS — E559 Vitamin D deficiency, unspecified: Secondary | ICD-10-CM

## 2022-09-07 DIAGNOSIS — G894 Chronic pain syndrome: Secondary | ICD-10-CM

## 2022-09-07 DIAGNOSIS — R739 Hyperglycemia, unspecified: Secondary | ICD-10-CM

## 2022-09-07 DIAGNOSIS — F411 Generalized anxiety disorder: Secondary | ICD-10-CM | POA: Diagnosis not present

## 2022-09-07 DIAGNOSIS — I10 Essential (primary) hypertension: Secondary | ICD-10-CM | POA: Diagnosis not present

## 2022-09-07 DIAGNOSIS — E782 Mixed hyperlipidemia: Secondary | ICD-10-CM

## 2022-09-07 MED ORDER — ALPRAZOLAM 1 MG PO TABS: 1.0000 mg | ORAL_TABLET | Freq: Two times a day (BID) | ORAL | 3 refills | Status: DC | PRN

## 2022-09-07 MED ORDER — TRAMADOL HCL 50 MG PO TABS: 50.0000 mg | ORAL_TABLET | Freq: Two times a day (BID) | ORAL | 3 refills | Status: DC | PRN

## 2022-09-07 NOTE — Assessment & Plan Note (Signed)
Due to OA of hip and knee On tramadol 50 mg twice daily as needed on weekdays On Flexeril as muscle relaxer PDMP reviewed Strongly advised to avoid marijuana products

## 2022-09-07 NOTE — Assessment & Plan Note (Signed)
Well-controlled with Xanax 1 mg BID Has tried SSRI in the past, but did not help 

## 2022-09-07 NOTE — Progress Notes (Signed)
Established Patient Office Visit  Subjective:  Patient ID: Lori Mullins, adult    DOB: Jul 04, 1965  Age: 57 y.o. MRN: [id number]  CC:  Chief Complaint  Patient presents with   Hypertension    Follow up     HPI Lori Mullins is a 57 y.o. adult with past medical history of HTN, OA, chronic pain syndrome, GAD, colonic polyps and obesity who presents for f/u of her chronic medical conditions.  HTN: BP is better controlled now with losartan-HCTZ. Takes medications regularly. Patient denies headache, dizziness, chest pain, dyspnea or palpitations.   GAD: Well-controlled with Xanax PRN. Denies any SI or HI.   OA and chronic pain: She has been taking Tramadol for chronic hip pain. She also follows up with Orthopedic surgeon, now s/p right THA. She had left hip pain as well, for which she was recommended THA as well, but she wants to wait for now. Her left hip pain have improved recently.  Discussed CT chest results with the patient in detail.  Past Medical History:  Diagnosis Date   Anxiety    Arthritis    Hip/Hands (right)   Bilateral carpal tunnel syndrome 11/29/2018   Campylobacter intestinal infection 1996   Became systemic   Cellulitis    occular   Hypertension    OCD (obsessive compulsive disorder) 2007   Plantar fasciitis    seen by orthoFonnie Jarvis    Past Surgical History:  Procedure Laterality Date   CARPAL TUNNEL RELEASE Left 03/29/2019   Procedure: CARPAL TUNNEL RELEASE ENDOSCOPIC;  Surgeon: Christena Flake, MD;  Location: [facility];  Service: Orthopedics;  Laterality: Left;   CARPAL TUNNEL RELEASE Right 05/31/2019   Procedure: CARPAL TUNNEL RELEASE ENDOSCOPIC;  Surgeon: Christena Flake, MD;  Location: [facility];  Service: Orthopedics;  Laterality: Right;   CERVICAL DISCECTOMY     C4-C5   COLONOSCOPY WITH PROPOFOL N/A 05/15/2019   Procedure: COLONOSCOPY WITH PROPOFOL;  Surgeon: Corbin Ade, MD;  Location: [facility];  Service: Endoscopy;  Laterality: N/A;   11:15am   EYE SURGERY N/A    [employer] 05/14/2020   POLYPECTOMY  05/15/2019   Procedure: POLYPECTOMY;  Surgeon: Corbin Ade, MD;  Location: [facility];  Service: Endoscopy;;   RETINAL DETACHMENT SURGERY Left 2005   SPINE SURGERY N/A    [employer] 05/14/2020    Family History  Problem Relation Age of Onset   Hypertension Father    Colon cancer Neg Hx    Colon polyps Neg Hx     Social History   Socioeconomic History   Marital status: Married    Spouse name: Not on file   Number of children: Not on file   Years of education: Not on file   Highest education level: Not on file  Occupational History   Not on file  Tobacco Use   Smoking status: Former    Packs/day: 2.00    Years: 20.00    Additional pack years: 0.00    Total pack years: 40.00    Types: Cigarettes    Quit date: 2010    Years since quitting: 14.5   Smokeless tobacco: Never  Vaping Use   Vaping Use: Never used  Substance and Sexual Activity   Alcohol use: Not Currently    Alcohol/week: 7.0 standard drinks of alcohol    Types: 7 Standard drinks or equivalent per week    Comment: occasionally   Drug use: Not Currently    Types: Marijuana  Sexual activity: Yes  Other Topics Concern   Not on file  Social History Narrative   Not on file   Social Determinants of Health   Financial Resource Strain: Not on file  Food Insecurity: Not on file  Transportation Needs: Not on file  Physical Activity: Not on file  Stress: Not on file  Social Connections: Not on file  Intimate Partner Violence: Not on file    Outpatient Medications Prior to Visit  Medication Sig Dispense Refill   acetaminophen (TYLENOL) 325 MG tablet Take 650 mg by mouth every 6 (six) hours as needed.     CANNABIDIOL PO Take 1 capsule by mouth daily. Full spectrum CBD Supplement     cyclobenzaprine (FLEXERIL) 10 MG tablet Take 1 tablet (10 mg total) by mouth at bedtime as needed for muscle spasms. 90 tablet 0   fexofenadine (ALLEGRA)  180 MG tablet Take 180 mg by mouth daily.     Glycerin-Hypromellose-PEG 400 (DRY EYE RELIEF DROPS) 0.2-0.2-1 % SOLN Place 1 drop into both eyes 3 (three) times daily as needed (DRY/IRRITATED EYES.).     losartan-hydrochlorothiazide (HYZAAR) 100-12.5 MG tablet TAKE 1 TABLET DAILY 90 tablet 3   Multiple Vitamin (MULTIVITAMIN WITH MINERALS) TABS tablet Take 1 tablet by mouth daily.     ALPRAZolam (XANAX) 1 MG tablet Take 1 tablet (1 mg total) by mouth 2 (two) times daily as needed for anxiety. 60 tablet 3   traMADol (ULTRAM) 50 MG tablet Take 1 tablet (50 mg total) by mouth every 12 (twelve) hours as needed. 60 tablet 3   No facility-administered medications prior to visit.    Allergies  Allergen Reactions   Milk-Related Compounds Anaphylaxis    ROS Review of Systems  Constitutional:  Negative for chills and fever.  HENT:  Negative for congestion, sinus pressure, sinus pain and sore throat.   Eyes:  Negative for pain and discharge.  Respiratory:  Negative for cough and shortness of breath.   Cardiovascular:  Negative for chest pain and palpitations.  Gastrointestinal:  Negative for abdominal pain, constipation, diarrhea, nausea and vomiting.  Endocrine: Negative for polydipsia and polyuria.  Genitourinary:  Negative for dysuria and hematuria.  Musculoskeletal:  Positive for arthralgias. Negative for neck pain and neck stiffness.  Skin:  Negative for rash.  Neurological:  Negative for dizziness, weakness, numbness and headaches.  Psychiatric/Behavioral:  Positive for sleep disturbance. Negative for agitation, behavioral problems and suicidal ideas. The patient is nervous/anxious.       Objective:    Physical Exam Vitals reviewed.  Constitutional:      General: He is not in acute distress.    Appearance: He is not diaphoretic.  HENT:     Head: Normocephalic and atraumatic.     Nose: Nose normal.     Mouth/Throat:     Mouth: Mucous membranes are moist.  Eyes:     General: No  scleral icterus.    Extraocular Movements: Extraocular movements intact.  Neck:     Vascular: No carotid bruit.  Cardiovascular:     Rate and Rhythm: Normal rate and regular rhythm.     Pulses: Normal pulses.     Heart sounds: Normal heart sounds. No murmur heard. Pulmonary:     Breath sounds: Normal breath sounds. No wheezing or rales.  Musculoskeletal:     Cervical back: Neck supple. No tenderness.     Right lower leg: No edema.     Left lower leg: No edema.  Skin:    General:  Skin is warm.     Findings: No rash.  Neurological:     General: No focal deficit present.     Mental Status: He is alert and oriented to person, place, and time.  Psychiatric:        Mood and Affect: Mood normal.        Behavior: Behavior normal.     BP 131/80 (BP Location: Right Arm, Patient Position: Sitting, Cuff Size: Normal)   Pulse 92   Ht 5\' 7"  (1.702 m)   Wt 169 lb 3.2 oz (76.7 kg)   LMP 03/01/2017   SpO2 98%   BMI 26.50 kg/m  Wt Readings from Last 3 Encounters:  09/07/22 169 lb 3.2 oz (76.7 kg)  05/06/22 158 lb 12.8 oz (72 kg)  12/29/21 157 lb 9.6 oz (71.5 kg)    Lab Results  Component Value Date   TSH 2.080 12/29/2021   Lab Results  Component Value Date   WBC 7.5 12/29/2021   HGB 13.1 12/29/2021   HCT 38.4 12/29/2021   MCV 89 12/29/2021   PLT 270 12/29/2021   Lab Results  Component Value Date   NA 142 12/29/2021   K 3.8 12/29/2021   CO2 23 12/29/2021   GLUCOSE 84 12/29/2021   BUN 9 12/29/2021   CREATININE 0.66 12/29/2021   BILITOT 0.3 12/29/2021   ALKPHOS 91 12/29/2021   AST 14 12/29/2021   ALT 11 12/29/2021   PROT 6.5 12/29/2021   ALBUMIN 4.6 12/29/2021   CALCIUM 9.6 12/29/2021   EGFR 102 12/29/2021   Lab Results  Component Value Date   CHOL 159 12/29/2021   Lab Results  Component Value Date   HDL 69 12/29/2021   Lab Results  Component Value Date   LDLCALC 66 12/29/2021   Lab Results  Component Value Date   TRIG 141 12/29/2021   Lab Results   Component Value Date   CHOLHDL 2.3 12/29/2021   Lab Results  Component Value Date   HGBA1C 5.4 10/24/2020      Assessment & Plan:   Problem List Items Addressed This Visit       Cardiovascular and Mediastinum   Essential hypertension, benign - Primary    BP Readings from Last 1 Encounters:  09/07/22 131/80  usually well-controlled with Losartan-HCTZ 100-12.5 mg QD Counseled for compliance with the medications Advised DASH diet and moderate exercise/walking as tolerated      Relevant Orders   TSH   CMP14+EGFR   CBC with Differential/Platelet     Respiratory   Centrilobular emphysema (HCC)    Noted on CT chest Currently asymptomatic Has quit smoking in 2000        Musculoskeletal and Integument   OA (osteoarthritis)    S/p right THA Gets steroid injections, follows up with Orthopedic surgery -[facility] Tramadol 50 mg BID PRN Has been advised to avoid Marijuana use while on opioids and BZD      Relevant Medications   traMADol (ULTRAM) 50 MG tablet     Other   GAD (generalized anxiety disorder) (Chronic)    Well-controlled with Xanax 1 mg BID Has tried SSRI in the past, but did not help      Relevant Medications   ALPRAZolam (XANAX) 1 MG tablet   Chronic pain syndrome    Due to OA of hip and knee On tramadol 50 mg twice daily as needed on weekdays On Flexeril as muscle relaxer PDMP reviewed Strongly advised to avoid marijuana products  Relevant Medications   traMADol (ULTRAM) 50 MG tablet   Other Visit Diagnoses     Mixed hyperlipidemia       Relevant Orders   Lipid panel   Vitamin D deficiency       Relevant Orders   VITAMIN D 25 Hydroxy (Vit-D Deficiency, Fractures)   Hyperglycemia       Relevant Orders   Hemoglobin A1c      Meds ordered this encounter  Medications   ALPRAZolam (XANAX) 1 MG tablet    Sig: Take 1 tablet (1 mg total) by mouth 2 (two) times daily as needed for anxiety.    Dispense:  60 tablet    Refill:  3    traMADol (ULTRAM) 50 MG tablet    Sig: Take 1 tablet (50 mg total) by mouth every 12 (twelve) hours as needed.    Dispense:  60 tablet    Refill:  3    Follow-up: Return in about 4 months (around 01/08/2023) for Annual physical.    Anabel Halon, MD

## 2022-09-07 NOTE — Assessment & Plan Note (Addendum)
Noted on CT chest Currently asymptomatic Has quit smoking in 2000

## 2022-09-07 NOTE — Assessment & Plan Note (Signed)
BP Readings from Last 1 Encounters:  09/07/22 131/80   usually well-controlled with Losartan-HCTZ 100-12.5 mg QD Counseled for compliance with the medications Advised DASH diet and moderate exercise/walking as tolerated

## 2022-09-07 NOTE — Assessment & Plan Note (Signed)
S/p right THA Gets steroid injections, follows up with Orthopedic surgery -[facility] Tramadol 50 mg BID PRN Has been advised to avoid Marijuana use while on opioids and BZD 

## 2022-09-07 NOTE — Patient Instructions (Signed)
Please continue to take medications as prescribed.  Please continue to follow low salt diet and perform moderate exercise/walking at least 150 mins/week. 

## 2022-09-25 ENCOUNTER — Encounter: Payer: Self-pay | Admitting: Internal Medicine

## 2022-09-25 ENCOUNTER — Other Ambulatory Visit (HOSPITAL_COMMUNITY): Payer: Self-pay | Admitting: Internal Medicine

## 2022-09-25 DIAGNOSIS — Z1231 Encounter for screening mammogram for malignant neoplasm of breast: Secondary | ICD-10-CM

## 2022-09-29 ENCOUNTER — Encounter: Payer: Self-pay | Admitting: Internal Medicine

## 2022-10-13 ENCOUNTER — Other Ambulatory Visit: Payer: Self-pay | Admitting: Internal Medicine

## 2022-10-13 ENCOUNTER — Encounter: Payer: Self-pay | Admitting: Internal Medicine

## 2022-10-13 DIAGNOSIS — M1611 Unilateral primary osteoarthritis, right hip: Secondary | ICD-10-CM

## 2022-10-13 IMAGING — MG MM DIGITAL SCREENING BILAT W/ TOMO AND CAD
8 series · 8 of 24 positions shown · non-contrast
Comparison: Previous exam(s).

CLINICAL DATA: Screening.

EXAM:
DIGITAL SCREENING BILATERAL MAMMOGRAM WITH TOMOSYNTHESIS AND CAD
TECHNIQUE: Bilateral screening digital craniocaudal and mediolateral oblique
mammograms were obtained. Bilateral screening digital breast
tomosynthesis was performed. The images were evaluated with
computer-aided detection.

[R MLO synth-2D]
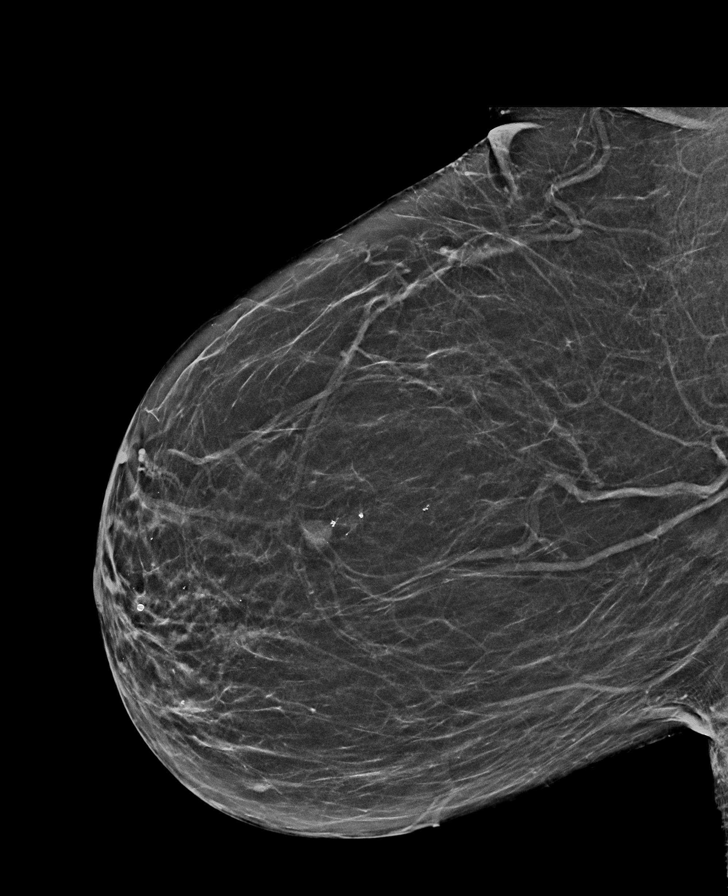

[R CC synth-2D]
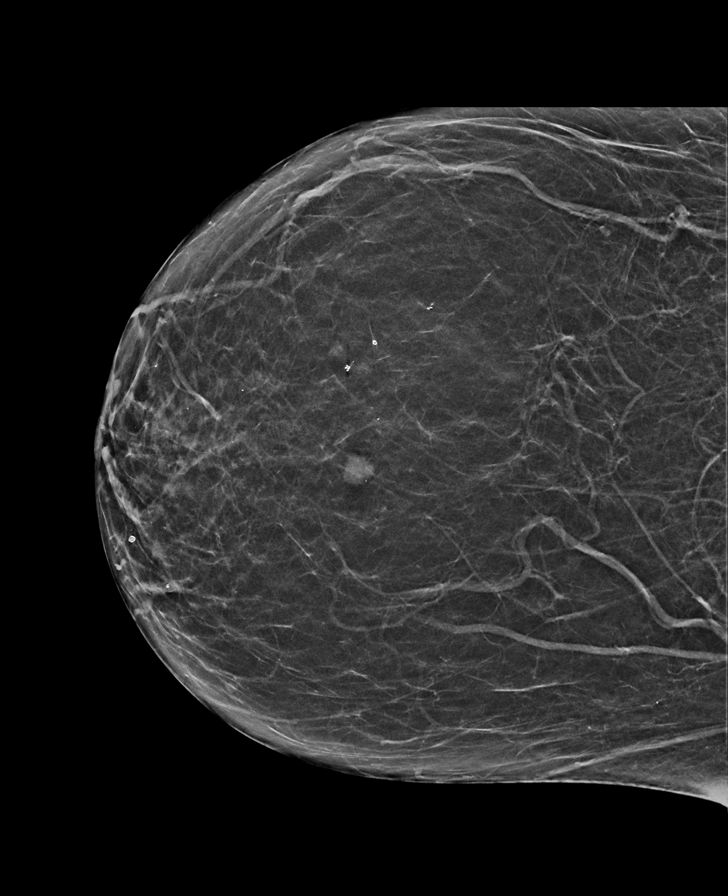

[L CC synth-2D]
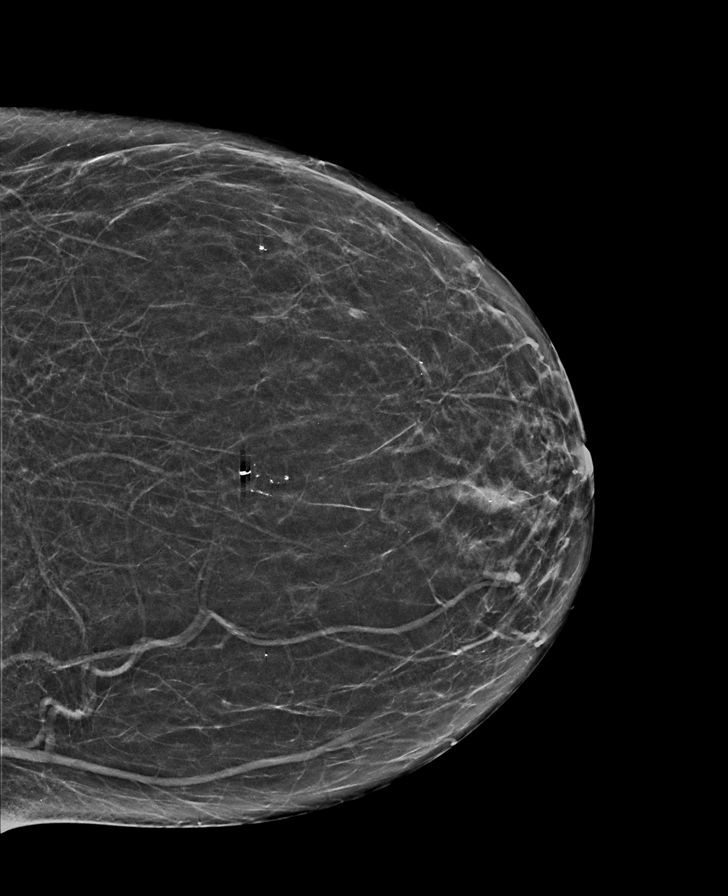

[L MLO synth-2D]
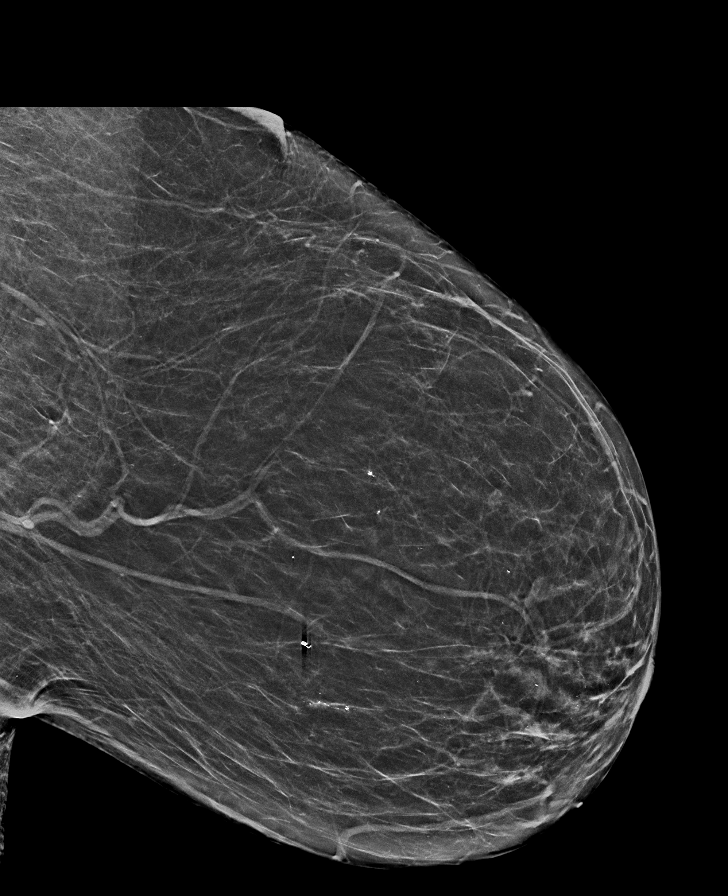

[R CC tomo · tomo slice 26/51.0]
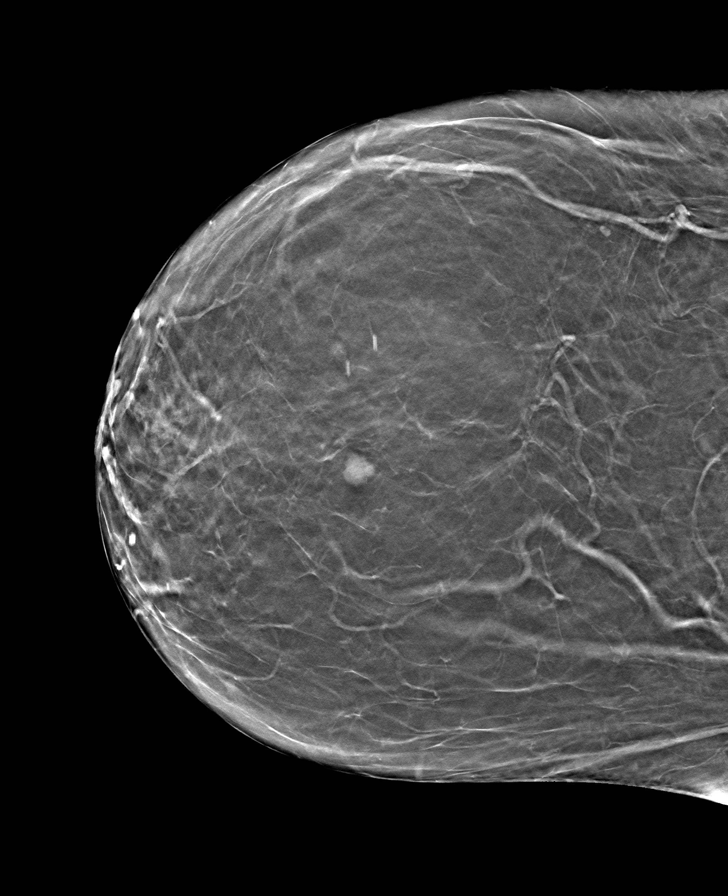

[R MLO tomo · tomo slice 29/57.0]
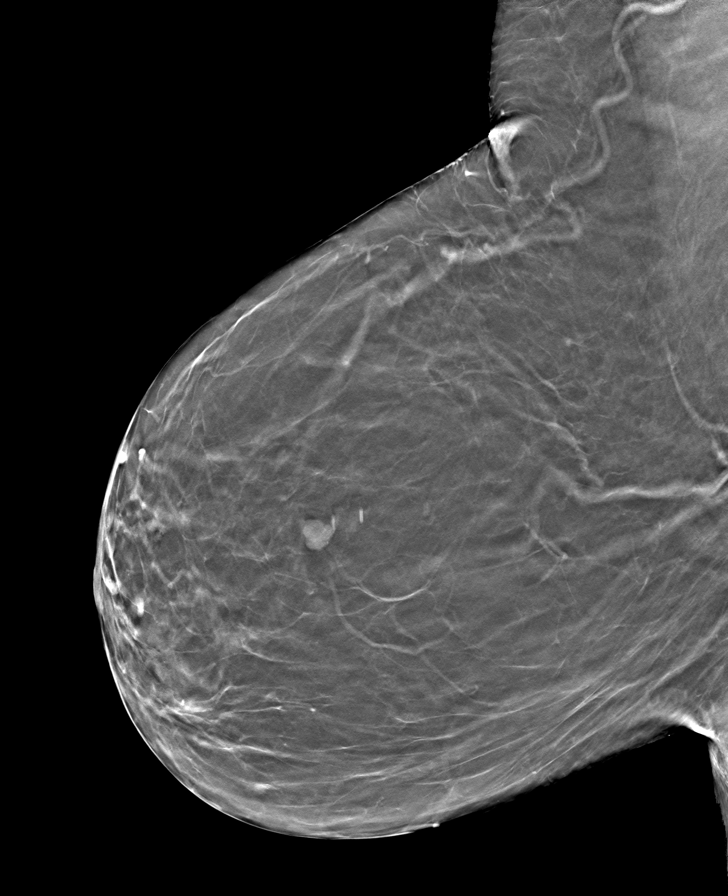

[L MLO tomo · tomo slice 29/58.0]
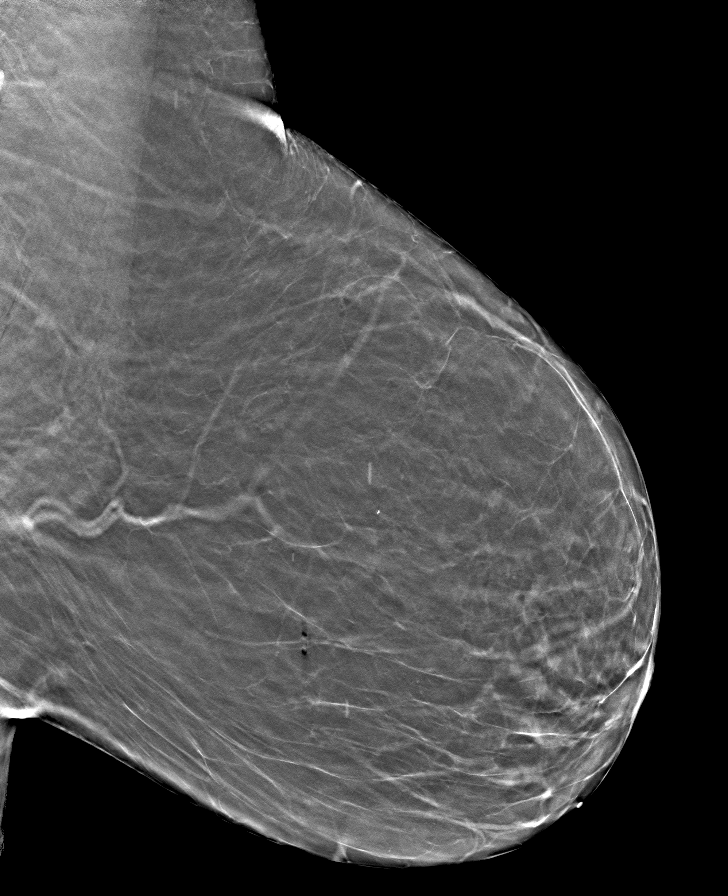

[L CC tomo · tomo slice 27/54.0]
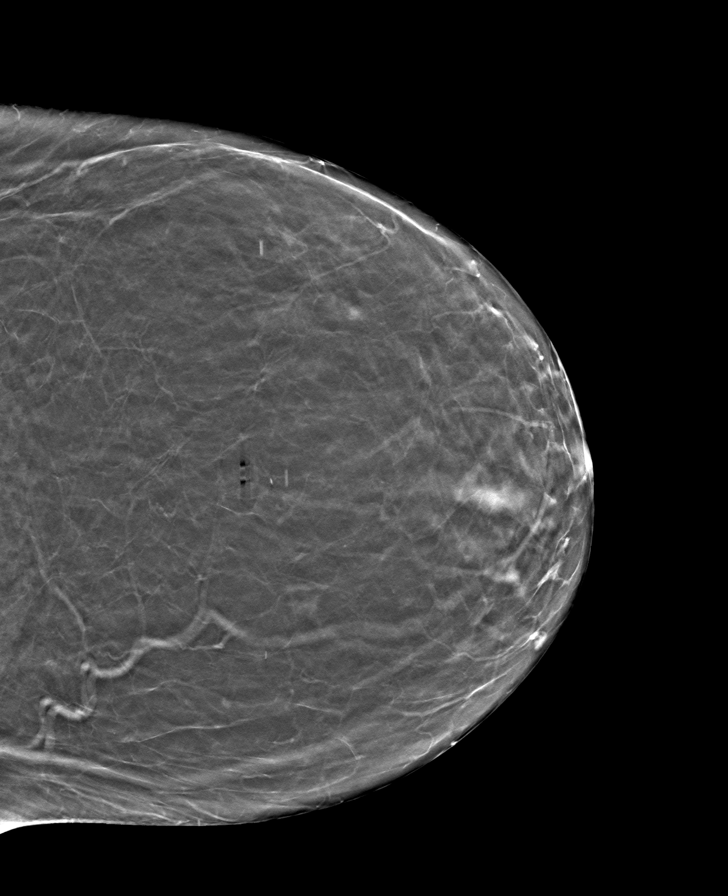

[8 of 24 positions shown; findings below may reference images not displayed]

ACR Breast Density Category b: There are scattered areas of
fibroglandular density.
FINDINGS: There are no findings suspicious for malignancy.
IMPRESSION: No mammographic evidence of malignancy. A result letter of this
screening mammogram will be mailed directly to the patient.

RECOMMENDATION:
Screening mammogram in one year. (Code:51-O-LD2)

BI-RADS CATEGORY  1: Negative.

## 2022-10-13 MED ORDER — CYCLOBENZAPRINE HCL 10 MG PO TABS: 10.0000 mg | ORAL_TABLET | Freq: Every evening | ORAL | 1 refills | Status: DC | PRN

## 2022-11-03 ENCOUNTER — Encounter: Payer: Self-pay | Admitting: Internal Medicine

## 2022-11-04 ENCOUNTER — Ambulatory Visit (HOSPITAL_COMMUNITY)
Admission: RE | Admit: 2022-11-04 | Discharge: 2022-11-04 | Disposition: A | Discharge status: Home / Self Care | Payer: Managed Care, Other (non HMO) | Source: Ambulatory Visit | Attending: Internal Medicine | Admitting: Internal Medicine

## 2022-11-04 DIAGNOSIS — Z1231 Encounter for screening mammogram for malignant neoplasm of breast: Secondary | ICD-10-CM | POA: Insufficient documentation

## 2022-12-12 LAB — CBC WITH DIFFERENTIAL/PLATELET
Basophils Absolute: 0.1 10*3/uL (ref 0.0–0.2)
Basos: 1 %
EOS (ABSOLUTE): 0.2 10*3/uL (ref 0.0–0.4)
Eos: 3 %
Hematocrit: 43.6 % (ref 34.0–46.6)
Hemoglobin: 14.2 g/dL (ref 11.1–15.9)
Immature Grans (Abs): 0 10*3/uL (ref 0.0–0.1)
Immature Granulocytes: 1 %
Lymphocytes Absolute: 2.9 10*3/uL (ref 0.7–3.1)
Lymphs: 34 %
MCH: 30.6 pg (ref 26.6–33.0)
MCHC: 32.6 g/dL (ref 31.5–35.7)
MCV: 94 fL (ref 79–97)
Monocytes Absolute: 0.5 10*3/uL (ref 0.1–0.9)
Monocytes: 6 %
Neutrophils Absolute: 4.8 10*3/uL (ref 1.4–7.0)
Neutrophils: 55 %
Platelets: 285 10*3/uL (ref 150–450)
RBC: 4.64 x10E6/uL (ref 3.77–5.28)
RDW: 12 % (ref 11.7–15.4)
WBC: 8.5 10*3/uL (ref 3.4–10.8)

## 2022-12-12 LAB — HEMOGLOBIN A1C
Est. average glucose Bld gHb Est-mCnc: 105 mg/dL
Hgb A1c MFr Bld: 5.3 % (ref 4.8–5.6)

## 2022-12-12 LAB — CMP14+EGFR
ALT: 20 [IU]/L (ref 0–32)
AST: 21 [IU]/L (ref 0–40)
Albumin: 4.5 g/dL (ref 3.8–4.9)
Alkaline Phosphatase: 99 [IU]/L (ref 44–121)
BUN/Creatinine Ratio: 18 (ref 9–23)
BUN: 13 mg/dL (ref 6–24)
Bilirubin Total: 0.4 mg/dL (ref 0.0–1.2)
CO2: 26 mmol/L (ref 20–29)
Calcium: 10.1 mg/dL (ref 8.7–10.2)
Chloride: 98 mmol/L (ref 96–106)
Creatinine, Ser: 0.74 mg/dL (ref 0.57–1.00)
Globulin, Total: 2.1 g/dL (ref 1.5–4.5)
Glucose: 99 mg/dL (ref 70–99)
Potassium: 5.3 mmol/L — ABNORMAL HIGH (ref 3.5–5.2)
Sodium: 139 mmol/L (ref 134–144)
Total Protein: 6.6 g/dL (ref 6.0–8.5)
eGFR: 94 mL/min/{1.73_m2} (ref >59)

## 2022-12-12 LAB — LIPID PANEL
Chol/HDL Ratio: 2.1 {ratio} (ref 0.0–4.4)
Cholesterol, Total: 177 mg/dL (ref 100–199)
HDL: 84 mg/dL (ref >39)
LDL Chol Calc (NIH): 79 mg/dL (ref 0–99)
Triglycerides: 78 mg/dL (ref 0–149)
VLDL Cholesterol Cal: 14 mg/dL (ref 5–40)

## 2022-12-12 LAB — VITAMIN D 25 HYDROXY (VIT D DEFICIENCY, FRACTURES): Vit D, 25-Hydroxy: 43 ng/mL (ref 30.0–100.0)

## 2022-12-12 LAB — TSH: TSH: 0.758 u[IU]/mL (ref 0.450–4.500)

## 2023-01-01 ENCOUNTER — Encounter: Payer: Self-pay | Admitting: Internal Medicine

## 2023-01-01 ENCOUNTER — Other Ambulatory Visit: Payer: Self-pay | Admitting: Internal Medicine

## 2023-01-01 DIAGNOSIS — F411 Generalized anxiety disorder: Secondary | ICD-10-CM

## 2023-01-01 DIAGNOSIS — M1611 Unilateral primary osteoarthritis, right hip: Secondary | ICD-10-CM

## 2023-01-01 MED ORDER — TRAMADOL HCL 50 MG PO TABS: 50.0000 mg | ORAL_TABLET | Freq: Two times a day (BID) | ORAL | 3 refills | Status: DC | PRN

## 2023-01-01 MED ORDER — ALPRAZOLAM 1 MG PO TABS: 1.0000 mg | ORAL_TABLET | Freq: Two times a day (BID) | ORAL | 3 refills | Status: DC | PRN

## 2023-01-13 ENCOUNTER — Ambulatory Visit (INDEPENDENT_AMBULATORY_CARE_PROVIDER_SITE_OTHER): Payer: Managed Care, Other (non HMO) | Admitting: Internal Medicine

## 2023-01-13 ENCOUNTER — Encounter: Payer: Self-pay | Admitting: Internal Medicine

## 2023-01-13 VITALS — BP 139/70 | HR 83 | Ht 67.0 in | Wt 168.8 lb

## 2023-01-13 DIAGNOSIS — Z0001 Encounter for general adult medical examination with abnormal findings: Secondary | ICD-10-CM

## 2023-01-13 DIAGNOSIS — I10 Essential (primary) hypertension: Secondary | ICD-10-CM | POA: Diagnosis not present

## 2023-01-13 DIAGNOSIS — F411 Generalized anxiety disorder: Secondary | ICD-10-CM

## 2023-01-13 DIAGNOSIS — I700 Atherosclerosis of aorta: Secondary | ICD-10-CM

## 2023-01-13 DIAGNOSIS — J432 Centrilobular emphysema: Secondary | ICD-10-CM

## 2023-01-13 MED ORDER — ALPRAZOLAM 1 MG PO TABS: 1.0000 mg | ORAL_TABLET | Freq: Three times a day (TID) | ORAL | 3 refills | Status: DC | PRN

## 2023-01-13 NOTE — Assessment & Plan Note (Addendum)
Physical exam as documented. Fasting blood tests reviewed today. Needs repeat colonoscopy due to hyperplastic polyps in last colonoscopy in 03/21, but states that she is asymptomatic and will think about repeat colonoscopy (has high copay).

## 2023-01-13 NOTE — Patient Instructions (Addendum)
Please start taking Xanax 1 mg as needed for anxiety, up to 3 times in a day.  Please continue to take other medications as prescribed.  Please continue to follow low salt diet and perform moderate exercise/walking at least 150 mins/week.

## 2023-01-13 NOTE — Progress Notes (Signed)
Established Patient Office Visit  Subjective:  Patient ID: Lori Mullins, adult    DOB: [birth year]  Age: 57 y.o. MRN: [id number]  CC:  Chief Complaint  Patient presents with   Annual Exam    HPI Lori Mullins is a 57 y.o. adult with past medical history of HTN, OA, chronic pain syndrome, GAD, colonic polyps and obesity who presents for annual physical.  HTN: BP is better controlled now with losartan-HCTZ. Takes medications regularly. Patient denies headache, dizziness, chest pain, dyspnea or palpitations.   GAD: Was well-controlled with Xanax 1 mg BID PRN, but she has been anxious lately due to recent elections, thinks it is end of democracy and her close contact people are going to be deported. Denies any SI or HI.   OA and chronic pain: She has been taking Tramadol for chronic hip pain. She also follows up with Orthopedic surgeon, now s/p right THA. She had left hip pain as well, for which she was recommended THA as well, but she wants to wait for now. Her left hip pain have improved recently.    Past Medical History:  Diagnosis Date   Anxiety    Arthritis    Hip/Hands (right)   Bilateral carpal tunnel syndrome 11/29/2018   Campylobacter intestinal infection 1996   Became systemic   Cellulitis    occular   Hypertension    OCD (obsessive compulsive disorder) 2007   Plantar fasciitis    seen by orthoFonnie Jarvis    Past Surgical History:  Procedure Laterality Date   CARPAL TUNNEL RELEASE Left 03/29/2019   Procedure: CARPAL TUNNEL RELEASE ENDOSCOPIC;  Surgeon: Christena Flake, MD;  Location: [facility];  Service: Orthopedics;  Laterality: Left;   CARPAL TUNNEL RELEASE Right 05/31/2019   Procedure: CARPAL TUNNEL RELEASE ENDOSCOPIC;  Surgeon: Christena Flake, MD;  Location: [facility];  Service: Orthopedics;  Laterality: Right;   CERVICAL DISCECTOMY     C4-C5   COLONOSCOPY WITH PROPOFOL N/A 05/15/2019   Procedure: COLONOSCOPY WITH PROPOFOL;  Surgeon: Corbin Ade, MD;   Location: [facility];  Service: Endoscopy;  Laterality: N/A;  11:15am   EYE SURGERY N/A    [facility] 05/14/2020   POLYPECTOMY  05/15/2019   Procedure: POLYPECTOMY;  Surgeon: Corbin Ade, MD;  Location: [facility];  Service: Endoscopy;;   RETINAL DETACHMENT SURGERY Left 2005   SPINE SURGERY N/A    [facility] 05/14/2020    Family History  Problem Relation Age of Onset   Hypertension Father    Colon cancer Neg Hx    Colon polyps Neg Hx     Social History   Socioeconomic History   Marital status: Married    Spouse name: Not on file   Number of children: Not on file   Years of education: Not on file   Highest education level: Not on file  Occupational History   Not on file  Tobacco Use   Smoking status: Former    Current packs/day: 0.00    Average packs/day: 2.0 packs/day for 20.0 years (40.0 ttl pk-yrs)    Types: Cigarettes    Start date: 2    Quit date: 2010    Years since quitting: 14.8   Smokeless tobacco: Never  Vaping Use   Vaping status: Never Used  Substance and Sexual Activity   Alcohol use: Not Currently    Alcohol/week: 7.0 standard drinks of alcohol    Types: 7 Standard drinks or equivalent per week  Comment: occasionally   Drug use: Not Currently    Types: Marijuana   Sexual activity: Yes  Other Topics Concern   Not on file  Social History Narrative   Not on file   Social Determinants of Health   Financial Resource Strain: Not on file  Food Insecurity: Not on file  Transportation Needs: Not on file  Physical Activity: Not on file  Stress: Not on file  Social Connections: Not on file  Intimate Partner Violence: Not on file    Outpatient Medications Prior to Visit  Medication Sig Dispense Refill   acetaminophen (TYLENOL) 325 MG tablet Take 650 mg by mouth every 6 (six) hours as needed.     CANNABIDIOL PO Take 1 capsule by mouth daily. Full spectrum CBD Supplement     cyclobenzaprine (FLEXERIL) 10 MG tablet Take 1 tablet (10 mg total)  by mouth at bedtime as needed for muscle spasms. 90 tablet 1   fexofenadine (ALLEGRA) 180 MG tablet Take 180 mg by mouth daily.     Glycerin-Hypromellose-PEG 400 (DRY EYE RELIEF DROPS) 0.2-0.2-1 % SOLN Place 1 drop into both eyes 3 (three) times daily as needed (DRY/IRRITATED EYES.).     losartan-hydrochlorothiazide (HYZAAR) 100-12.5 MG tablet TAKE 1 TABLET DAILY 90 tablet 3   Multiple Vitamin (MULTIVITAMIN WITH MINERALS) TABS tablet Take 1 tablet by mouth daily.     traMADol (ULTRAM) 50 MG tablet Take 1 tablet (50 mg total) by mouth every 12 (twelve) hours as needed. 60 tablet 3   ALPRAZolam (XANAX) 1 MG tablet Take 1 tablet (1 mg total) by mouth 2 (two) times daily as needed for anxiety. 60 tablet 3   No facility-administered medications prior to visit.    Allergies  Allergen Reactions   Milk-Related Compounds Anaphylaxis    ROS Review of Systems  Constitutional:  Negative for chills and fever.  HENT:  Negative for congestion, sinus pressure, sinus pain and sore throat.   Eyes:  Negative for pain and discharge.  Respiratory:  Negative for cough and shortness of breath.   Cardiovascular:  Negative for chest pain and palpitations.  Gastrointestinal:  Negative for abdominal pain, constipation, diarrhea, nausea and vomiting.  Endocrine: Negative for polydipsia and polyuria.  Genitourinary:  Negative for dysuria and hematuria.  Musculoskeletal:  Positive for arthralgias. Negative for neck pain and neck stiffness.  Skin:  Negative for rash.  Neurological:  Negative for dizziness, weakness, numbness and headaches.  Psychiatric/Behavioral:  Positive for sleep disturbance. Negative for agitation, behavioral problems and suicidal ideas. The patient is nervous/anxious.       Objective:    Physical Exam Vitals reviewed.  Constitutional:      General: He is not in acute distress.    Appearance: He is not diaphoretic.  HENT:     Head: Normocephalic and atraumatic.     Nose: Nose normal.      Mouth/Throat:     Mouth: Mucous membranes are moist.  Eyes:     General: No scleral icterus.    Extraocular Movements: Extraocular movements intact.  Neck:     Vascular: No carotid bruit.  Cardiovascular:     Rate and Rhythm: Normal rate and regular rhythm.     Pulses: Normal pulses.     Heart sounds: Normal heart sounds. No murmur heard. Pulmonary:     Breath sounds: Normal breath sounds. No wheezing or rales.  Musculoskeletal:     Cervical back: Neck supple. No tenderness.     Right lower leg: No edema.  Left lower leg: No edema.  Skin:    General: Skin is warm.     Findings: No rash.  Neurological:     General: No focal deficit present.     Mental Status: He is alert and oriented to person, place, and time.  Psychiatric:        Mood and Affect: Mood normal.        Behavior: Behavior normal.     BP 139/70 (BP Location: Right Arm, Patient Position: Sitting, Cuff Size: Normal)   Pulse 83   Ht 5\' 7"  (1.702 m)   Wt 168 lb 12.8 oz (76.6 kg)   LMP 03/01/2017   SpO2 98%   BMI 26.44 kg/m  Wt Readings from Last 3 Encounters:  01/13/23 168 lb 12.8 oz (76.6 kg)  09/07/22 169 lb 3.2 oz (76.7 kg)  05/06/22 158 lb 12.8 oz (72 kg)    Lab Results  Component Value Date   TSH 0.758 12/11/2022   Lab Results  Component Value Date   WBC 8.5 12/11/2022   HGB 14.2 12/11/2022   HCT 43.6 12/11/2022   MCV 94 12/11/2022   PLT 285 12/11/2022   Lab Results  Component Value Date   NA 139 12/11/2022   K 5.3 (H) 12/11/2022   CO2 26 12/11/2022   GLUCOSE 99 12/11/2022   BUN 13 12/11/2022   CREATININE 0.74 12/11/2022   BILITOT 0.4 12/11/2022   ALKPHOS 99 12/11/2022   AST 21 12/11/2022   ALT 20 12/11/2022   PROT 6.6 12/11/2022   ALBUMIN 4.5 12/11/2022   CALCIUM 10.1 12/11/2022   EGFR 94 12/11/2022   Lab Results  Component Value Date   CHOL 177 12/11/2022   Lab Results  Component Value Date   HDL 84 12/11/2022   Lab Results  Component Value Date   LDLCALC 79  12/11/2022   Lab Results  Component Value Date   TRIG 78 12/11/2022   Lab Results  Component Value Date   CHOLHDL 2.1 12/11/2022   Lab Results  Component Value Date   HGBA1C 5.3 12/11/2022      Assessment & Plan:   Problem List Items Addressed This Visit       Cardiovascular and Mediastinum   Essential hypertension, benign    BP Readings from Last 1 Encounters:  01/13/23 139/70   usually well-controlled with Losartan-HCTZ 100-12.5 mg QD Counseled for compliance with the medications Advised DASH diet and moderate exercise/walking as tolerated      Aortic atherosclerosis (HCC)    Noted on CT chest Lipid profile reviewed - wnl        Respiratory   Centrilobular emphysema (HCC)    Noted on CT chest Currently asymptomatic Has quit smoking in 2000        Other   GAD (generalized anxiety disorder) (Chronic)    Uncontrolled with Xanax 1 mg BID, increased dose to 1 mg TID PRN Has tried SSRI in the past, but did not help      Relevant Medications   ALPRAZolam (XANAX) 1 MG tablet   Encounter for general adult medical examination with abnormal findings - Primary    Physical exam as documented. Fasting blood tests reviewed today. Needs repeat colonoscopy due to hyperplastic polyps in last colonoscopy in 03/21, but states that she is asymptomatic and will think about repeat colonoscopy (has high copay).      Meds ordered this encounter  Medications   ALPRAZolam (XANAX) 1 MG tablet    Sig: Take 1  tablet (1 mg total) by mouth 3 (three) times daily as needed for anxiety.    Dispense:  90 tablet    Refill:  3    Dose change    Follow-up: Return in about 4 months (around 05/13/2023) for GAD and HTN.    Anabel Halon, MD

## 2023-01-13 NOTE — Assessment & Plan Note (Signed)
Uncontrolled with Xanax 1 mg BID, increased dose to 1 mg TID PRN Has tried SSRI in the past, but did not help

## 2023-01-13 NOTE — Assessment & Plan Note (Signed)
Noted on CT chest Lipid profile reviewed - wnl

## 2023-01-13 NOTE — Assessment & Plan Note (Signed)
BP Readings from Last 1 Encounters:  01/13/23 139/70   usually well-controlled with Losartan-HCTZ 100-12.5 mg QD Counseled for compliance with the medications Advised DASH diet and moderate exercise/walking as tolerated

## 2023-01-13 NOTE — Assessment & Plan Note (Signed)
Noted on CT chest Currently asymptomatic Has quit smoking in 2000

## 2023-02-18 ENCOUNTER — Other Ambulatory Visit: Payer: Self-pay | Admitting: Internal Medicine

## 2023-02-18 DIAGNOSIS — I10 Essential (primary) hypertension: Secondary | ICD-10-CM

## 2023-05-13 ENCOUNTER — Encounter: Payer: Self-pay | Admitting: Internal Medicine

## 2023-05-13 ENCOUNTER — Ambulatory Visit: Payer: Managed Care, Other (non HMO) | Admitting: Internal Medicine

## 2023-05-13 VITALS — BP 122/74 | HR 93 | Ht 66.5 in | Wt 170.8 lb

## 2023-05-13 DIAGNOSIS — I10 Essential (primary) hypertension: Secondary | ICD-10-CM

## 2023-05-13 DIAGNOSIS — M160 Bilateral primary osteoarthritis of hip: Secondary | ICD-10-CM | POA: Diagnosis not present

## 2023-05-13 DIAGNOSIS — Z23 Encounter for immunization: Secondary | ICD-10-CM | POA: Diagnosis not present

## 2023-05-13 DIAGNOSIS — F411 Generalized anxiety disorder: Secondary | ICD-10-CM

## 2023-05-13 DIAGNOSIS — M1611 Unilateral primary osteoarthritis, right hip: Secondary | ICD-10-CM

## 2023-05-13 MED ORDER — ALPRAZOLAM 1 MG PO TABS: 1.0000 mg | ORAL_TABLET | Freq: Three times a day (TID) | ORAL | 3 refills | Status: DC | PRN

## 2023-05-13 MED ORDER — CYCLOBENZAPRINE HCL 10 MG PO TABS: 10.0000 mg | ORAL_TABLET | Freq: Every evening | ORAL | 1 refills | Status: DC | PRN

## 2023-05-13 MED ORDER — TRAMADOL HCL 50 MG PO TABS: 50.0000 mg | ORAL_TABLET | Freq: Two times a day (BID) | ORAL | 3 refills | Status: DC | PRN

## 2023-05-13 NOTE — Progress Notes (Unsigned)
 Established Patient Office Visit  Subjective:  Patient ID: Lori Mullins, adult    DOB: 11/03/1965  Age: 58 y.o. MRN: [id number]  CC:  Chief Complaint  Patient presents with   Hypertension    Four month follow up    Anxiety    Four month follow up    Hip Pain    Left hip pain     HPI Lori Mullins is a 58 y.o. adult with past medical history of HTN, OA, chronic pain syndrome, GAD, colonic polyps and obesity who presents for annual physical.  HTN: BP is better controlled now with losartan-HCTZ. Takes medications regularly. Patient denies headache, dizziness, chest pain, dyspnea or palpitations.   GAD: Was well-controlled with Xanax 1 mg BID PRN, but she has been anxious lately due to recent elections, thinks it is end of democracy and her close contact people are going to be deported. Denies any SI or HI.   OA and chronic pain: She has been taking Tramadol for chronic hip pain. She also follows up with Orthopedic surgeon, now s/p right THA. She had left hip pain as well, for which she was recommended THA as well, but she wants to wait for now. Her left hip pain have improved recently.    Past Medical History:  Diagnosis Date   Anxiety    Arthritis    Hip/Hands (right)   Bilateral carpal tunnel syndrome 11/29/2018   Campylobacter intestinal infection 1996   Became systemic   Cellulitis    occular   Hypertension    OCD (obsessive compulsive disorder) 2007   Plantar fasciitis    seen by orthoFonnie Jarvis    Past Surgical History:  Procedure Laterality Date   CARPAL TUNNEL RELEASE Left 03/29/2019   Procedure: CARPAL TUNNEL RELEASE ENDOSCOPIC;  Surgeon: Christena Flake, MD;  Location: [facility];  Service: Orthopedics;  Laterality: Left;   CARPAL TUNNEL RELEASE Right 05/31/2019   Procedure: CARPAL TUNNEL RELEASE ENDOSCOPIC;  Surgeon: Christena Flake, MD;  Location: [facility];  Service: Orthopedics;  Laterality: Right;   CERVICAL DISCECTOMY     C4-C5   COLONOSCOPY WITH  PROPOFOL N/A 05/15/2019   Procedure: COLONOSCOPY WITH PROPOFOL;  Surgeon: Corbin Ade, MD;  Location: [facility];  Service: Endoscopy;  Laterality: N/A;  11:15am   EYE SURGERY N/A    [employer]reesia 05/14/2020   POLYPECTOMY  05/15/2019   Procedure: POLYPECTOMY;  Surgeon: Corbin Ade, MD;  Location: [facility];  Service: Endoscopy;;   RETINAL DETACHMENT SURGERY Left 2005   SPINE SURGERY N/A    Phreesia 05/14/2020    Family History  Problem Relation Age of Onset   Hypertension Father    Colon cancer Neg Hx    Colon polyps Neg Hx     Social History   Socioeconomic History   Marital status: Married    Spouse name: Not on file   Number of children: Not on file   Years of education: Not on file   Highest education level: Not on file  Occupational History   Not on file  Tobacco Use   Smoking status: Former    Current packs/day: 0.00    Average packs/day: 2.0 packs/day for 20.0 years (40.0 ttl pk-yrs)    Types: Cigarettes    Start date: 52    Quit date: 2010    Years since quitting: 15.2   Smokeless tobacco: Never  Vaping Use   Vaping status: Never Used  Substance and Sexual  Activity   Alcohol use: Not Currently    Alcohol/week: 7.0 standard drinks of alcohol    Types: 7 Standard drinks or equivalent per week    Comment: occasionally   Drug use: Not Currently    Types: Marijuana   Sexual activity: Yes  Other Topics Concern   Not on file  Social History Narrative   Not on file   Social Drivers of Health   Financial Resource Strain: Not on file  Food Insecurity: Not on file  Transportation Needs: Not on file  Physical Activity: Not on file  Stress: Not on file  Social Connections: Not on file  Intimate Partner Violence: Not on file    Outpatient Medications Prior to Visit  Medication Sig Dispense Refill   acetaminophen (TYLENOL) 325 MG tablet Take 650 mg by mouth every 6 (six) hours as needed.     ALPRAZolam (XANAX) 1 MG tablet Take 1 tablet (1 mg total)  by mouth 3 (three) times daily as needed for anxiety. 90 tablet 3   CANNABIDIOL PO Take 1 capsule by mouth daily. Full spectrum CBD Supplement     cyclobenzaprine (FLEXERIL) 10 MG tablet Take 1 tablet (10 mg total) by mouth at bedtime as needed for muscle spasms. 90 tablet 1   fexofenadine (ALLEGRA) 180 MG tablet Take 180 mg by mouth daily.     Glycerin-Hypromellose-PEG 400 (DRY EYE RELIEF DROPS) 0.2-0.2-1 % SOLN Place 1 drop into both eyes 3 (three) times daily as needed (DRY/IRRITATED EYES.).     losartan-hydrochlorothiazide (HYZAAR) 100-12.5 MG tablet TAKE 1 TABLET DAILY 90 tablet 3   Multiple Vitamin (MULTIVITAMIN WITH MINERALS) TABS tablet Take 1 tablet by mouth daily. (Patient not taking: Reported on 05/13/2023)     traMADol (ULTRAM) 50 MG tablet Take 1 tablet (50 mg total) by mouth every 12 (twelve) hours as needed. 60 tablet 3   No facility-administered medications prior to visit.    Allergies  Allergen Reactions   Milk-Related Compounds Anaphylaxis    ROS Review of Systems  Constitutional:  Negative for chills and fever.  HENT:  Negative for congestion, sinus pressure, sinus pain and sore throat.   Eyes:  Negative for pain and discharge.  Respiratory:  Negative for cough and shortness of breath.   Cardiovascular:  Negative for chest pain and palpitations.  Gastrointestinal:  Negative for abdominal pain, constipation, diarrhea, nausea and vomiting.  Endocrine: Negative for polydipsia and polyuria.  Genitourinary:  Negative for dysuria and hematuria.  Musculoskeletal:  Positive for arthralgias. Negative for neck pain and neck stiffness.  Skin:  Negative for rash.  Neurological:  Negative for dizziness, weakness, numbness and headaches.  Psychiatric/Behavioral:  Positive for sleep disturbance. Negative for agitation, behavioral problems and suicidal ideas. The patient is nervous/anxious.       Objective:    Physical Exam Vitals reviewed.  Constitutional:      General: He  is not in acute distress.    Appearance: He is not diaphoretic.  HENT:     Head: Normocephalic and atraumatic.     Nose: Nose normal.     Mouth/Throat:     Mouth: Mucous membranes are moist.  Eyes:     General: No scleral icterus.    Extraocular Movements: Extraocular movements intact.  Neck:     Vascular: No carotid bruit.  Cardiovascular:     Rate and Rhythm: Normal rate and regular rhythm.     Pulses: Normal pulses.     Heart sounds: Normal heart sounds. No murmur  heard. Pulmonary:     Breath sounds: Normal breath sounds. No wheezing or rales.  Musculoskeletal:     Cervical back: Neck supple. No tenderness.     Right lower leg: No edema.     Left lower leg: No edema.  Skin:    General: Skin is warm.     Findings: No rash.  Neurological:     General: No focal deficit present.     Mental Status: He is alert and oriented to person, place, and time.  Psychiatric:        Mood and Affect: Mood normal.        Behavior: Behavior normal.     BP 122/74 (BP Location: Left Arm, Patient Position: Sitting, Cuff Size: Normal)   Pulse 93   Ht 5' 6.5" (1.689 m)   Wt 170 lb 12.8 oz (77.5 kg)   LMP 03/01/2017   SpO2 98%   BMI 27.15 kg/m  Wt Readings from Last 3 Encounters:  05/13/23 170 lb 12.8 oz (77.5 kg)  01/13/23 168 lb 12.8 oz (76.6 kg)  09/07/22 169 lb 3.2 oz (76.7 kg)    Lab Results  Component Value Date   TSH 0.758 12/11/2022   Lab Results  Component Value Date   WBC 8.5 12/11/2022   HGB 14.2 12/11/2022   HCT 43.6 12/11/2022   MCV 94 12/11/2022   PLT 285 12/11/2022   Lab Results  Component Value Date   NA 139 12/11/2022   K 5.3 (H) 12/11/2022   CO2 26 12/11/2022   GLUCOSE 99 12/11/2022   BUN 13 12/11/2022   CREATININE 0.74 12/11/2022   BILITOT 0.4 12/11/2022   ALKPHOS 99 12/11/2022   AST 21 12/11/2022   ALT 20 12/11/2022   PROT 6.6 12/11/2022   ALBUMIN 4.5 12/11/2022   CALCIUM 10.1 12/11/2022   EGFR 94 12/11/2022   Lab Results  Component Value  Date   CHOL 177 12/11/2022   Lab Results  Component Value Date   HDL 84 12/11/2022   Lab Results  Component Value Date   LDLCALC 79 12/11/2022   Lab Results  Component Value Date   TRIG 78 12/11/2022   Lab Results  Component Value Date   CHOLHDL 2.1 12/11/2022   Lab Results  Component Value Date   HGBA1C 5.3 12/11/2022      Assessment & Plan:   Problem List Items Addressed This Visit   None   No orders of the defined types were placed in this encounter.   Follow-up: No follow-ups on file.    Anabel Halon, MD

## 2023-05-13 NOTE — Assessment & Plan Note (Signed)
 BP Readings from Last 1 Encounters:  05/13/23 122/74   usually well-controlled with Losartan-HCTZ 100-12.5 mg QD Counseled for compliance with the medications Advised DASH diet and moderate exercise/walking as tolerated

## 2023-05-13 NOTE — Assessment & Plan Note (Signed)
 Uncontrolled with Xanax 1 mg BID, increased dose to 1 mg TID PRN Has tried SSRI in the past, but did not help

## 2023-05-13 NOTE — Patient Instructions (Signed)
Please continue to take medications as prescribed.  Please continue to follow low salt diet and perform moderate exercise/walking at least 150 mins/week. 

## 2023-05-13 NOTE — Assessment & Plan Note (Signed)
S/p right THA Gets steroid injections, follows up with Orthopedic surgery -[facility] Tramadol 50 mg BID PRN Has been advised to avoid Marijuana use while on opioids and BZD 

## 2023-08-09 ENCOUNTER — Telehealth: Payer: Self-pay | Admitting: Internal Medicine

## 2023-08-09 NOTE — Telephone Encounter (Signed)
 Pre Op Left Hip medical clearance form, surgery scheduled for Sept. 2025  Noted Copied Sleeved   Original form place in completed front folder at front desk appointment scheduled with Dr Lydia Sams 07.15.2025

## 2023-09-14 ENCOUNTER — Ambulatory Visit: Admitting: Internal Medicine

## 2023-09-14 ENCOUNTER — Encounter: Payer: Self-pay | Admitting: Internal Medicine

## 2023-09-14 VITALS — BP 131/72 | HR 92 | Ht 66.5 in | Wt 170.0 lb

## 2023-09-14 DIAGNOSIS — Z01818 Encounter for other preprocedural examination: Secondary | ICD-10-CM | POA: Diagnosis not present

## 2023-09-14 DIAGNOSIS — J432 Centrilobular emphysema: Secondary | ICD-10-CM | POA: Diagnosis not present

## 2023-09-14 DIAGNOSIS — M150 Primary generalized (osteo)arthritis: Secondary | ICD-10-CM | POA: Diagnosis not present

## 2023-09-14 DIAGNOSIS — M160 Bilateral primary osteoarthritis of hip: Secondary | ICD-10-CM

## 2023-09-14 DIAGNOSIS — F411 Generalized anxiety disorder: Secondary | ICD-10-CM

## 2023-09-14 DIAGNOSIS — E782 Mixed hyperlipidemia: Secondary | ICD-10-CM

## 2023-09-14 DIAGNOSIS — E559 Vitamin D deficiency, unspecified: Secondary | ICD-10-CM

## 2023-09-14 DIAGNOSIS — R739 Hyperglycemia, unspecified: Secondary | ICD-10-CM

## 2023-09-14 DIAGNOSIS — I10 Essential (primary) hypertension: Secondary | ICD-10-CM | POA: Diagnosis not present

## 2023-09-14 MED ORDER — TRAMADOL HCL 50 MG PO TABS: 50.0000 mg | ORAL_TABLET | Freq: Two times a day (BID) | ORAL | 3 refills | Status: DC | PRN

## 2023-09-14 MED ORDER — ALPRAZOLAM 1 MG PO TABS: 1.0000 mg | ORAL_TABLET | Freq: Three times a day (TID) | ORAL | 3 refills | Status: DC | PRN

## 2023-09-14 NOTE — Assessment & Plan Note (Signed)
Noted on CT chest Currently asymptomatic Has quit smoking in 2000

## 2023-09-14 NOTE — Assessment & Plan Note (Signed)
 RCRI: 0 DASI: 7.01 METs EKG (2023): Sinus rhythm.  No signs of active ischemia. Medically optimized for left THA

## 2023-09-14 NOTE — Assessment & Plan Note (Signed)
 BP Readings from Last 1 Encounters:  09/14/23 131/72   usually well-controlled with Losartan -HCTZ 100-12.5 mg QD Counseled for compliance with the medications Advised DASH diet and moderate exercise/walking as tolerated

## 2023-09-14 NOTE — Progress Notes (Signed)
 Established Patient Office Visit  Subjective:  Patient ID: Lori Mullins, adult    DOB: [birth year]  Age: 58 y.o. MRN: [id number]  CC:  Chief Complaint  Patient presents with   Medical Management of Chronic Issues    4 month f/u     HPI Lori Mullins is a 58 y.o. adult with past medical history of HTN, OA, chronic pain syndrome, GAD, colonic polyps and obesity who presents for f/u of her chronic medical conditions.  HTN: BP is better controlled now with losartan -HCTZ. Takes medications regularly. Patient denies headache, dizziness, chest pain, dyspnea or palpitations.   GAD: Well-controlled with Xanax  1 mg TID PRN. She has been anxious lately due to elections and political turmoil, thinks it is end of democracy. Denies any SI or HI.  OA and chronic pain: She has been taking Tramadol  for chronic hip pain. She also follows up with Orthopedic surgeon, now s/p right THA. She has left hip pain as well, for which she was recommended THA as well. Her left hip pain have worsened recently, planned to get left THA in 09/25.    Past Medical History:  Diagnosis Date   Anxiety    Arthritis    Hip/Hands (right)   Bilateral carpal tunnel syndrome 11/29/2018   Campylobacter intestinal infection 1996   Became systemic   Cellulitis    occular   Hypertension    OCD (obsessive compulsive disorder) 2007   Plantar fasciitis    seen by orthoGLENWOOD Monte    Past Surgical History:  Procedure Laterality Date   CARPAL TUNNEL RELEASE Left 03/29/2019   Procedure: CARPAL TUNNEL RELEASE ENDOSCOPIC;  Surgeon: Edie Norleen PARAS, MD;  Location: [facility];  Service: Orthopedics;  Laterality: Left;   CARPAL TUNNEL RELEASE Right 05/31/2019   Procedure: CARPAL TUNNEL RELEASE ENDOSCOPIC;  Surgeon: Edie Norleen PARAS, MD;  Location: [facility];  Service: Orthopedics;  Laterality: Right;   CERVICAL DISCECTOMY     C4-C5   COLONOSCOPY WITH PROPOFOL  N/A 05/15/2019   Procedure: COLONOSCOPY WITH PROPOFOL ;  Surgeon:  Shaaron Lamar CHRISTELLA, MD;  Location: [facility];  Service: Endoscopy;  Laterality: N/A;  11:15am   EYE SURGERY N/A    [facility] 05/14/2020   POLYPECTOMY  05/15/2019   Procedure: POLYPECTOMY;  Surgeon: Shaaron Lamar CHRISTELLA, MD;  Location: [facility];  Service: Endoscopy;;   RETINAL DETACHMENT SURGERY Left 2005   SPINE SURGERY N/A    [facility]reesia 05/14/2020    Family History  Problem Relation Age of Onset   Hypertension Father    Colon cancer Neg Hx    Colon polyps Neg Hx     Social History   Socioeconomic History   Marital status: Married    Spouse name: Not on file   Number of children: Not on file   Years of education: Not on file   Highest education level: Not on file  Occupational History   Not on file  Tobacco Use   Smoking status: Former    Current packs/day: 0.00    Average packs/day: 2.0 packs/day for 20.0 years (40.0 ttl pk-yrs)    Types: Cigarettes    Start date: 56    Quit date: 2010    Years since quitting: 15.5   Smokeless tobacco: Never  Vaping Use   Vaping status: Never Used  Substance and Sexual Activity   Alcohol use: Not Currently    Alcohol/week: 7.0 standard drinks of alcohol    Types: 7 Standard drinks or equivalent per  week    Comment: occasionally   Drug use: Not Currently    Types: Marijuana   Sexual activity: Yes  Other Topics Concern   Not on file  Social History Narrative   Not on file   Social Drivers of Health   Financial Resource Strain: Not on file  Food Insecurity: Not on file  Transportation Needs: Not on file  Physical Activity: Not on file  Stress: Not on file  Social Connections: Not on file  Intimate Partner Violence: Not on file    Outpatient Medications Prior to Visit  Medication Sig Dispense Refill   acetaminophen  (TYLENOL ) 325 MG tablet Take 650 mg by mouth every 6 (six) hours as needed.     CANNABIDIOL PO Take 1 capsule by mouth daily. Full spectrum CBD Supplement     cyclobenzaprine  (FLEXERIL ) 10 MG tablet Take 1  tablet (10 mg total) by mouth at bedtime as needed for muscle spasms. 90 tablet 1   fexofenadine (ALLEGRA) 180 MG tablet Take 180 mg by mouth daily.     Glycerin-Hypromellose-PEG 400 (DRY EYE RELIEF DROPS) 0.2-0.2-1 % SOLN Place 1 drop into both eyes 3 (three) times daily as needed (DRY/IRRITATED EYES.).     losartan -hydrochlorothiazide (HYZAAR) 100-12.5 MG tablet TAKE 1 TABLET DAILY 90 tablet 3   Multiple Vitamin (MULTIVITAMIN WITH MINERALS) TABS tablet Take 1 tablet by mouth daily.     ALPRAZolam  (XANAX ) 1 MG tablet Take 1 tablet (1 mg total) by mouth 3 (three) times daily as needed for anxiety. 90 tablet 3   traMADol  (ULTRAM ) 50 MG tablet Take 1 tablet (50 mg total) by mouth every 12 (twelve) hours as needed. 60 tablet 3   No facility-administered medications prior to visit.    Allergies  Allergen Reactions   Milk-Related Compounds Anaphylaxis    ROS Review of Systems  Constitutional:  Negative for chills and fever.  HENT:  Negative for congestion, sinus pressure, sinus pain and sore throat.   Eyes:  Negative for pain and discharge.  Respiratory:  Negative for cough and shortness of breath.   Cardiovascular:  Negative for chest pain and palpitations.  Gastrointestinal:  Negative for abdominal pain, constipation, diarrhea, nausea and vomiting.  Endocrine: Negative for polydipsia and polyuria.  Genitourinary:  Negative for dysuria and hematuria.  Musculoskeletal:  Positive for arthralgias. Negative for neck pain and neck stiffness.  Skin:  Negative for rash.  Neurological:  Negative for dizziness, weakness, numbness and headaches.  Psychiatric/Behavioral:  Positive for sleep disturbance. Negative for agitation, behavioral problems and suicidal ideas. The patient is nervous/anxious.       Objective:    Physical Exam Vitals reviewed.  Constitutional:      General: He is not in acute distress.    Appearance: He is not diaphoretic.  HENT:     Head: Normocephalic and atraumatic.      Nose: Nose normal.     Mouth/Throat:     Mouth: Mucous membranes are moist.  Eyes:     General: No scleral icterus.    Extraocular Movements: Extraocular movements intact.  Neck:     Vascular: No carotid bruit.  Cardiovascular:     Rate and Rhythm: Normal rate and regular rhythm.     Pulses: Normal pulses.     Heart sounds: Normal heart sounds. No murmur heard. Pulmonary:     Breath sounds: Normal breath sounds. No wheezing or rales.  Musculoskeletal:     Cervical back: Neck supple. No tenderness.     Right  lower leg: No edema.     Left lower leg: No edema.  Skin:    General: Skin is warm.     Findings: No rash.  Neurological:     General: No focal deficit present.     Mental Status: He is alert and oriented to person, place, and time.  Psychiatric:        Mood and Affect: Mood normal.        Behavior: Behavior normal.     BP 131/72   Pulse 92   Ht 5' 6.5 (1.689 m)   Wt 170 lb (77.1 kg)   LMP 03/01/2017   SpO2 98%   BMI 27.03 kg/m  Wt Readings from Last 3 Encounters:  09/14/23 170 lb (77.1 kg)  05/13/23 170 lb 12.8 oz (77.5 kg)  01/13/23 168 lb 12.8 oz (76.6 kg)    Lab Results  Component Value Date   TSH 0.758 12/11/2022   Lab Results  Component Value Date   WBC 8.5 12/11/2022   HGB 14.2 12/11/2022   HCT 43.6 12/11/2022   MCV 94 12/11/2022   PLT 285 12/11/2022   Lab Results  Component Value Date   NA 139 12/11/2022   K 5.3 (H) 12/11/2022   CO2 26 12/11/2022   GLUCOSE 99 12/11/2022   BUN 13 12/11/2022   CREATININE 0.74 12/11/2022   BILITOT 0.4 12/11/2022   ALKPHOS 99 12/11/2022   AST 21 12/11/2022   ALT 20 12/11/2022   PROT 6.6 12/11/2022   ALBUMIN 4.5 12/11/2022   CALCIUM 10.1 12/11/2022   EGFR 94 12/11/2022   Lab Results  Component Value Date   CHOL 177 12/11/2022   Lab Results  Component Value Date   HDL 84 12/11/2022   Lab Results  Component Value Date   LDLCALC 79 12/11/2022   Lab Results  Component Value Date   TRIG 78  12/11/2022   Lab Results  Component Value Date   CHOLHDL 2.1 12/11/2022   Lab Results  Component Value Date   HGBA1C 5.3 12/11/2022      Assessment & Plan:   Problem List Items Addressed This Visit       Cardiovascular and Mediastinum   Essential hypertension, benign - Primary   BP Readings from Last 1 Encounters:  09/14/23 131/72   usually well-controlled with Losartan -HCTZ 100-12.5 mg QD Counseled for compliance with the medications Advised DASH diet and moderate exercise/walking as tolerated      Relevant Orders   TSH   CMP14+EGFR   CBC with Differential/Platelet     Respiratory   Centrilobular emphysema (HCC)   Noted on CT chest Currently asymptomatic Has quit smoking in 2000        Musculoskeletal and Integument   OA (osteoarthritis)   S/p right THA Planned for left THA - medically optimized with moderate risk for THA Has had steroid injections, follows up with Orthopedic surgery -[facility] Tramadol  50 mg BID PRN Has been advised to avoid Marijuana use while on opioids and BZD Flexeril  as needed for muscle spasms      Relevant Medications   traMADol  (ULTRAM ) 50 MG tablet     Other   GAD (generalized anxiety disorder) (Chronic)   Overall better controlled with Xanax  1 mg TID PRN, PDMP reviewed, refilled Has tried SSRI (Celexa) in the past, but did not help and she felt emotionless -discussed about trying a different SSRI -Zoloft or Prozac, but she prefers to wait for now      Relevant Medications  ALPRAZolam  (XANAX ) 1 MG tablet   Other Relevant Orders   TSH   Preop examination   RCRI: 0 DASI: 7.01 METs EKG (2023): Sinus rhythm.  No signs of active ischemia. Medically optimized for left THA      Other Visit Diagnoses       Vitamin D  deficiency       Relevant Orders   VITAMIN D  25 Hydroxy (Vit-D Deficiency, Fractures)     Mixed hyperlipidemia       Relevant Orders   Lipid panel     Hyperglycemia       Relevant Orders   Hemoglobin  A1c        Meds ordered this encounter  Medications   ALPRAZolam  (XANAX ) 1 MG tablet    Sig: Take 1 tablet (1 mg total) by mouth 3 (three) times daily as needed for anxiety.    Dispense:  90 tablet    Refill:  3    Dose change   traMADol  (ULTRAM ) 50 MG tablet    Sig: Take 1 tablet (50 mg total) by mouth every 12 (twelve) hours as needed.    Dispense:  60 tablet    Refill:  3    Follow-up: Return in about 4 months (around 01/15/2024) for HTN.    Suzzane MARLA Blanch, MD

## 2023-09-14 NOTE — Patient Instructions (Addendum)
Please continue to take medications as prescribed.  Please continue to follow low salt diet and perform moderate exercise/walking as tolerated.  Please get fasting blood tests done before the next visit.

## 2023-09-14 NOTE — Assessment & Plan Note (Signed)
 S/p right THA Planned for left THA - medically optimized with moderate risk for THA Has had steroid injections, follows up with Orthopedic surgery -[facility] Tramadol  50 mg BID PRN Has been advised to avoid Marijuana use while on opioids and BZD Flexeril  as needed for muscle spasms

## 2023-09-14 NOTE — Assessment & Plan Note (Signed)
 Uncontrolled with Xanax 1 mg BID, increased dose to 1 mg TID PRN Has tried SSRI in the past, but did not help

## 2023-09-20 ENCOUNTER — Encounter: Payer: Self-pay | Admitting: Internal Medicine

## 2023-09-21 ENCOUNTER — Other Ambulatory Visit: Payer: Self-pay

## 2023-09-21 DIAGNOSIS — M160 Bilateral primary osteoarthritis of hip: Secondary | ICD-10-CM

## 2023-09-21 MED ORDER — CYCLOBENZAPRINE HCL 10 MG PO TABS: 10.0000 mg | ORAL_TABLET | Freq: Every evening | ORAL | 1 refills | Status: DC | PRN

## 2023-10-28 ENCOUNTER — Encounter: Payer: Self-pay | Admitting: Internal Medicine

## 2023-11-11 ENCOUNTER — Ambulatory Visit: Payer: Self-pay | Admitting: Internal Medicine

## 2023-11-11 LAB — CBC WITH DIFFERENTIAL/PLATELET
Basophils Absolute: 0.1 x10E3/uL (ref 0.0–0.2)
Basos: 1 %
EOS (ABSOLUTE): 0.2 x10E3/uL (ref 0.0–0.4)
Eos: 3 %
Hematocrit: 40.5 % (ref 34.0–46.6)
Hemoglobin: 13.4 g/dL (ref 11.1–15.9)
Immature Grans (Abs): 0 x10E3/uL (ref 0.0–0.1)
Immature Granulocytes: 0 %
Lymphocytes Absolute: 2.6 x10E3/uL (ref 0.7–3.1)
Lymphs: 38 %
MCH: 30.6 pg (ref 26.6–33.0)
MCHC: 33.1 g/dL (ref 31.5–35.7)
MCV: 93 fL (ref 79–97)
Monocytes Absolute: 0.4 x10E3/uL (ref 0.1–0.9)
Monocytes: 6 %
Neutrophils Absolute: 3.4 x10E3/uL (ref 1.4–7.0)
Neutrophils: 52 %
Platelets: 267 x10E3/uL (ref 150–450)
RBC: 4.38 x10E6/uL (ref 3.77–5.28)
RDW: 12.2 % (ref 11.7–15.4)
WBC: 6.7 x10E3/uL (ref 3.4–10.8)

## 2023-11-11 LAB — HEMOGLOBIN A1C
Est. average glucose Bld gHb Est-mCnc: 105 mg/dL
Hgb A1c MFr Bld: 5.3 % (ref 4.8–5.6)

## 2023-11-11 LAB — LIPID PANEL
Chol/HDL Ratio: 2.5 ratio (ref 0.0–4.4)
Cholesterol, Total: 159 mg/dL (ref 100–199)
HDL: 64 mg/dL (ref >39)
LDL Chol Calc (NIH): 78 mg/dL (ref 0–99)
Triglycerides: 95 mg/dL (ref 0–149)
VLDL Cholesterol Cal: 17 mg/dL (ref 5–40)

## 2023-11-11 LAB — CMP14+EGFR
ALT: 13 IU/L (ref 0–32)
AST: 18 IU/L (ref 0–40)
Albumin: 4.4 g/dL (ref 3.8–4.9)
Alkaline Phosphatase: 99 IU/L (ref 44–121)
BUN/Creatinine Ratio: 20 (ref 9–23)
BUN: 15 mg/dL (ref 6–24)
Bilirubin Total: 0.3 mg/dL (ref 0.0–1.2)
CO2: 24 mmol/L (ref 20–29)
Calcium: 9.6 mg/dL (ref 8.7–10.2)
Chloride: 99 mmol/L (ref 96–106)
Creatinine, Ser: 0.74 mg/dL (ref 0.57–1.00)
Globulin, Total: 1.7 g/dL (ref 1.5–4.5)
Glucose: 89 mg/dL (ref 70–99)
Potassium: 4.2 mmol/L (ref 3.5–5.2)
Sodium: 138 mmol/L (ref 134–144)
Total Protein: 6.1 g/dL (ref 6.0–8.5)
eGFR: 93 mL/min/1.73 (ref >59)

## 2023-11-11 LAB — TSH: TSH: 0.77 u[IU]/mL (ref 0.450–4.500)

## 2023-11-11 LAB — VITAMIN D 25 HYDROXY (VIT D DEFICIENCY, FRACTURES): Vit D, 25-Hydroxy: 30.4 ng/mL (ref 30.0–100.0)

## 2023-12-09 ENCOUNTER — Telehealth: Payer: Self-pay

## 2023-12-09 NOTE — Telephone Encounter (Signed)
 Copied from CRM [id number]. Topic: Clinical - Medication Question >> Dec 09, 2023  9:53 AM Joesph NOVAK wrote: Reason for CRM: Donnice from [facility] is calling about patients refill request on Tramadol . Patient was prescribed Oxycodone  42 tablets from another provider and he wants to be sure that she needs both. If someone could give him a call to follow up, his contact information is below.   [phone]  Ref#- [id number].

## 2023-12-09 NOTE — Telephone Encounter (Signed)
 Spoke to pharmacist informed him of this. Verbalized understanding.

## 2023-12-10 ENCOUNTER — Other Ambulatory Visit (HOSPITAL_COMMUNITY): Payer: Self-pay | Admitting: Internal Medicine

## 2023-12-10 ENCOUNTER — Telehealth: Payer: Self-pay

## 2023-12-10 DIAGNOSIS — Z1231 Encounter for screening mammogram for malignant neoplasm of breast: Secondary | ICD-10-CM

## 2023-12-10 NOTE — Telephone Encounter (Signed)
 Copied from CRM [id number]. Topic: Clinical - Medication Question >> Dec 10, 2023 10:02 AM Turkey B wrote: Reason for CRM: ross from [facility] called states needs to spea directly with nurse about it being ok for patient to take oxycodone  and tramadol   but on separate days, per notes in the system

## 2023-12-10 NOTE — Telephone Encounter (Signed)
 Te was addressed  on 10/09

## 2023-12-14 ENCOUNTER — Telehealth: Payer: Self-pay

## 2023-12-14 NOTE — Telephone Encounter (Signed)
 Copied from CRM #[id number]. Topic: Clinical - Medication Question >> Dec 14, 2023  2:31 PM Selinda RAMAN wrote: Reason for CRM: Evalene pharmacist with [facility] called in stating a peer to peer needs to be done because this patient is on 2 different pain meds along with the Xanax  and Flexeril . I called and spoke with Tammy and she said to leave a CRM indicating a peer to peer needs to be done. Please contact [phone] and use [phone]. Please assist patient further.

## 2023-12-20 ENCOUNTER — Ambulatory Visit (HOSPITAL_COMMUNITY)
Admission: RE | Admit: 2023-12-20 | Discharge: 2023-12-20 | Disposition: A | Discharge status: Home / Self Care | Source: Ambulatory Visit | Attending: Internal Medicine | Admitting: Internal Medicine

## 2023-12-20 ENCOUNTER — Encounter (HOSPITAL_COMMUNITY): Payer: Self-pay

## 2023-12-20 DIAGNOSIS — Z1231 Encounter for screening mammogram for malignant neoplasm of breast: Secondary | ICD-10-CM | POA: Insufficient documentation

## 2023-12-29 ENCOUNTER — Encounter: Payer: Self-pay | Admitting: Internal Medicine

## 2023-12-29 ENCOUNTER — Other Ambulatory Visit: Payer: Self-pay | Admitting: Internal Medicine

## 2023-12-29 DIAGNOSIS — M160 Bilateral primary osteoarthritis of hip: Secondary | ICD-10-CM

## 2023-12-29 DIAGNOSIS — F411 Generalized anxiety disorder: Secondary | ICD-10-CM

## 2023-12-29 MED ORDER — TRAMADOL HCL 50 MG PO TABS: 50.0000 mg | ORAL_TABLET | Freq: Two times a day (BID) | ORAL | 3 refills | Status: DC | PRN

## 2023-12-29 MED ORDER — ALPRAZOLAM 1 MG PO TABS: 1.0000 mg | ORAL_TABLET | Freq: Three times a day (TID) | ORAL | 3 refills | Status: DC | PRN

## 2024-01-04 ENCOUNTER — Encounter: Payer: Self-pay | Admitting: Internal Medicine

## 2024-01-13 ENCOUNTER — Ambulatory Visit: Admitting: Internal Medicine

## 2024-01-13 ENCOUNTER — Encounter: Payer: Self-pay | Admitting: Internal Medicine

## 2024-01-13 VITALS — BP 132/83 | HR 109 | Ht 66.5 in | Wt 175.6 lb

## 2024-01-13 DIAGNOSIS — I700 Atherosclerosis of aorta: Secondary | ICD-10-CM | POA: Diagnosis not present

## 2024-01-13 DIAGNOSIS — I10 Essential (primary) hypertension: Secondary | ICD-10-CM

## 2024-01-13 DIAGNOSIS — F411 Generalized anxiety disorder: Secondary | ICD-10-CM

## 2024-01-13 DIAGNOSIS — Z0001 Encounter for general adult medical examination with abnormal findings: Secondary | ICD-10-CM

## 2024-01-13 DIAGNOSIS — G894 Chronic pain syndrome: Secondary | ICD-10-CM

## 2024-01-13 DIAGNOSIS — M160 Bilateral primary osteoarthritis of hip: Secondary | ICD-10-CM

## 2024-01-13 MED ORDER — CYCLOBENZAPRINE HCL 10 MG PO TABS: 10.0000 mg | ORAL_TABLET | Freq: Every evening | ORAL | 1 refills | Status: DC | PRN

## 2024-01-13 MED ORDER — LOSARTAN POTASSIUM-HCTZ 100-12.5 MG PO TABS: 1.0000 | ORAL_TABLET | Freq: Every day | ORAL | 3 refills | Status: DC

## 2024-01-13 NOTE — Assessment & Plan Note (Signed)
 BP Readings from Last 1 Encounters:  01/13/24 132/83   usually well-controlled with Losartan -HCTZ 100-12.5 mg QD Counseled for compliance with the medications Advised DASH diet and moderate exercise/walking as tolerated

## 2024-01-13 NOTE — Assessment & Plan Note (Addendum)
 Due to OA of hip and knee On tramadol  50 mg twice daily as needed on weekdays On Flexeril  as muscle relaxer PDMP reviewed

## 2024-01-13 NOTE — Progress Notes (Signed)
 Established Patient Office Visit  Subjective:  Patient ID: Lori Mullins, adult    DOB: [birth year]  Age: 58 y.o. MRN: [id number]  CC:  Chief Complaint  Patient presents with   Hypertension    Follow up    HPI Lori Mullins is a 58 y.o. adult with past medical history of HTN, OA, chronic pain syndrome, GAD, colonic polyps and obesity who presents for f/u of her chronic medical conditions.  HTN: BP is better controlled now with losartan -HCTZ. Takes medications regularly. Patient denies headache, dizziness, chest pain, dyspnea or palpitations.   GAD: Well-controlled with Xanax  1 mg TID PRN. She has been anxious lately due to political turmoil, thinks it is end of democracy. Denies any SI or HI.  OA and chronic pain: She follows up with Orthopedic surgeon, now s/p b/l THA. She has started walking without support now. She takes Tramadol  for chronic hip pain.    Past Medical History:  Diagnosis Date   Anxiety    Arthritis    Hip/Hands (right)   Bilateral carpal tunnel syndrome 11/29/2018   Campylobacter intestinal infection 1996   Became systemic   Cellulitis    occular   Hypertension    OCD (obsessive compulsive disorder) 2007   Plantar fasciitis    [facility] by [facility]    Past Surgical History:  Procedure Laterality Date   CARPAL TUNNEL RELEASE Left 03/29/2019   Procedure: CARPAL TUNNEL RELEASE ENDOSCOPIC;  Surgeon: Edie Norleen PARAS, MD;  Location: [facility];  Service: Orthopedics;  Laterality: Left;   CARPAL TUNNEL RELEASE Right 05/31/2019   Procedure: CARPAL TUNNEL RELEASE ENDOSCOPIC;  Surgeon: Edie Norleen PARAS, MD;  Location: [facility];  Service: Orthopedics;  Laterality: Right;   CERVICAL DISCECTOMY     C4-C5   COLONOSCOPY WITH PROPOFOL  N/A 05/15/2019   Procedure: COLONOSCOPY WITH PROPOFOL ;  Surgeon: Shaaron Lamar CHRISTELLA, MD;  Location: [facility];  Service: Endoscopy;  Laterality: N/A;  11:15am   EYE SURGERY N/A    [employer] 05/14/2020   POLYPECTOMY  05/15/2019    Procedure: POLYPECTOMY;  Surgeon: Shaaron Lamar CHRISTELLA, MD;  Location: [facility];  Service: Endoscopy;;   RETINAL DETACHMENT SURGERY Left 2005   SPINE SURGERY N/A    [employer] 05/14/2020    Family History  Problem Relation Age of Onset   Hypertension Father    Colon cancer Neg Hx    Colon polyps Neg Hx     Social History   Socioeconomic History   Marital status: Married    Spouse name: Not on file   Number of children: Not on file   Years of education: Not on file   Highest education level: Not on file  Occupational History   Not on file  Tobacco Use   Smoking status: Former    Current packs/day: 0.00    Average packs/day: 2.0 packs/day for 20.0 years (40.0 ttl pk-yrs)    Types: Cigarettes    Start date: 43    Quit date: 2010    Years since quitting: 15.8   Smokeless tobacco: Never  Vaping Use   Vaping status: Never Used  Substance and Sexual Activity   Alcohol use: Not Currently    Alcohol/week: 7.0 standard drinks of alcohol    Types: 7 Standard drinks or equivalent per week    Comment: occasionally   Drug use: Not Currently    Types: Marijuana   Sexual activity: Yes  Other Topics Concern   Not on file  Social  History Narrative   Not on file   Social Drivers of Health   Financial Resource Strain: Not on file  Food Insecurity: Not on file  Transportation Needs: Not on file  Physical Activity: Not on file  Stress: Not on file  Social Connections: Not on file  Intimate Partner Violence: Not on file    Outpatient Medications Prior to Visit  Medication Sig Dispense Refill   acetaminophen  (TYLENOL ) 325 MG tablet Take 650 mg by mouth every 6 (six) hours as needed.     ALPRAZolam  (XANAX ) 1 MG tablet Take 1 tablet (1 mg total) by mouth 3 (three) times daily as needed for anxiety. 90 tablet 3   CANNABIDIOL PO Take 1 capsule by mouth daily. Full spectrum CBD Supplement     fexofenadine (ALLEGRA) 180 MG tablet Take 180 mg by mouth daily.      Glycerin-Hypromellose-PEG 400 (DRY EYE RELIEF DROPS) 0.2-0.2-1 % SOLN Place 1 drop into both eyes 3 (three) times daily as needed (DRY/IRRITATED EYES.).     Multiple Vitamin (MULTIVITAMIN WITH MINERALS) TABS tablet Take 1 tablet by mouth daily.     traMADol  (ULTRAM ) 50 MG tablet Take 1 tablet (50 mg total) by mouth every 12 (twelve) hours as needed. 60 tablet 3   cyclobenzaprine  (FLEXERIL ) 10 MG tablet Take 1 tablet (10 mg total) by mouth at bedtime as needed for muscle spasms. 90 tablet 1   losartan -hydrochlorothiazide (HYZAAR) 100-12.5 MG tablet TAKE 1 TABLET DAILY 90 tablet 3   No facility-administered medications prior to visit.    Allergies  Allergen Reactions   Milk-Related Compounds Anaphylaxis    ROS Review of Systems  Constitutional:  Negative for chills and fever.  HENT:  Negative for congestion, sinus pressure, sinus pain and sore throat.   Eyes:  Negative for pain and discharge.  Respiratory:  Negative for cough and shortness of breath.   Cardiovascular:  Negative for chest pain and palpitations.  Gastrointestinal:  Negative for abdominal pain, constipation, diarrhea, nausea and vomiting.  Endocrine: Negative for polydipsia and polyuria.  Genitourinary:  Negative for dysuria and hematuria.  Musculoskeletal:  Positive for arthralgias. Negative for neck pain and neck stiffness.  Skin:  Negative for rash.  Neurological:  Negative for dizziness, weakness, numbness and headaches.  Psychiatric/Behavioral:  Positive for sleep disturbance. Negative for agitation, behavioral problems and suicidal ideas. The patient is nervous/anxious.       Objective:    Physical Exam Vitals reviewed.  Constitutional:      General: He is not in acute distress.    Appearance: He is not diaphoretic.  HENT:     Head: Normocephalic and atraumatic.     Nose: Nose normal.     Mouth/Throat:     Mouth: Mucous membranes are moist.  Eyes:     General: No scleral icterus.    Extraocular  Movements: Extraocular movements intact.  Neck:     Vascular: No carotid bruit.  Cardiovascular:     Rate and Rhythm: Normal rate and regular rhythm.     Heart sounds: Normal heart sounds. No murmur heard. Pulmonary:     Breath sounds: Normal breath sounds. No wheezing or rales.  Abdominal:     Palpations: Abdomen is soft.     Tenderness: There is no abdominal tenderness.  Musculoskeletal:     Cervical back: Neck supple. No tenderness.     Right lower leg: No edema.     Left lower leg: No edema.  Skin:    General: Skin  is warm.     Findings: No rash.  Neurological:     General: No focal deficit present.     Mental Status: He is alert and oriented to person, place, and time.     Cranial Nerves: No cranial nerve deficit.     Sensory: No sensory deficit.     Motor: No weakness.  Psychiatric:        Mood and Affect: Mood normal.        Behavior: Behavior normal.     BP 132/83   Pulse (!) 109   Ht 5' 6.5 (1.689 m)   Wt 175 lb 9.6 oz (79.7 kg)   LMP 03/01/2017   SpO2 97%   BMI 27.92 kg/m  Wt Readings from Last 3 Encounters:  01/13/24 175 lb 9.6 oz (79.7 kg)  09/14/23 170 lb (77.1 kg)  05/13/23 170 lb 12.8 oz (77.5 kg)    Lab Results  Component Value Date   TSH 0.770 11/10/2023   Lab Results  Component Value Date   WBC 6.7 11/10/2023   HGB 13.4 11/10/2023   HCT 40.5 11/10/2023   MCV 93 11/10/2023   PLT 267 11/10/2023   Lab Results  Component Value Date   NA 138 11/10/2023   K 4.2 11/10/2023   CO2 24 11/10/2023   GLUCOSE 89 11/10/2023   BUN 15 11/10/2023   CREATININE 0.74 11/10/2023   BILITOT 0.3 11/10/2023   ALKPHOS 99 11/10/2023   AST 18 11/10/2023   ALT 13 11/10/2023   PROT 6.1 11/10/2023   ALBUMIN 4.4 11/10/2023   CALCIUM 9.6 11/10/2023   EGFR 93 11/10/2023   Lab Results  Component Value Date   CHOL 159 11/10/2023   Lab Results  Component Value Date   HDL 64 11/10/2023   Lab Results  Component Value Date   LDLCALC 78 11/10/2023   Lab  Results  Component Value Date   TRIG 95 11/10/2023   Lab Results  Component Value Date   CHOLHDL 2.5 11/10/2023   Lab Results  Component Value Date   HGBA1C 5.3 11/10/2023      Assessment & Plan:   Problem List Items Addressed This Visit       Cardiovascular and Mediastinum   Essential hypertension   BP Readings from Last 1 Encounters:  01/13/24 132/83   usually well-controlled with Losartan -HCTZ 100-12.5 mg QD Counseled for compliance with the medications Advised DASH diet and moderate exercise/walking as tolerated      Relevant Medications   losartan -hydrochlorothiazide (HYZAAR) 100-12.5 MG tablet   Aortic atherosclerosis   Noted on CT chest Lipid profile reviewed - wnl      Relevant Medications   losartan -hydrochlorothiazide (HYZAAR) 100-12.5 MG tablet     Musculoskeletal and Integument   OA (osteoarthritis) of bilateral hips   S/p b/l THA Has had steroid injections, follows up with Orthopedic surgery -[facility] Tramadol  50 mg BID PRN Has been advised to avoid Marijuana use while on opioids and BZD Flexeril  as needed for muscle spasms      Relevant Medications   cyclobenzaprine  (FLEXERIL ) 10 MG tablet     Other   GAD (generalized anxiety disorder) (Chronic)   Overall better controlled with Xanax  1 mg TID PRN, PDMP reviewed, refilled Has tried SSRI (Celexa) in the past, but did not help and she felt emotionless      Encounter for general adult medical examination with abnormal findings - Primary   Physical exam as documented. Fasting blood tests reviewed today. Needs repeat  colonoscopy due to hyperplastic polyps in last colonoscopy in 03/21, but states that she is asymptomatic and will think about repeat colonoscopy (has high copay).      Chronic pain syndrome   Due to OA of hip and knee On tramadol  50 mg twice daily as needed on weekdays On Flexeril  as muscle relaxer PDMP reviewed      Relevant Medications   cyclobenzaprine  (FLEXERIL ) 10 MG  tablet   Other Relevant Orders   ToxASSURE Select 13 (MW), Urine      Meds ordered this encounter  Medications   losartan -hydrochlorothiazide (HYZAAR) 100-12.5 MG tablet    Sig: Take 1 tablet by mouth daily.    Dispense:  90 tablet    Refill:  3   cyclobenzaprine  (FLEXERIL ) 10 MG tablet    Sig: Take 1 tablet (10 mg total) by mouth at bedtime as needed for muscle spasms.    Dispense:  90 tablet    Refill:  1    Follow-up: Return in about 4 months (around 05/12/2024) for GAD and HTN.    Suzzane MARLA Blanch, MD

## 2024-01-13 NOTE — Assessment & Plan Note (Addendum)
 Overall better controlled with Xanax  1 mg TID PRN, PDMP reviewed, refilled Has tried SSRI (Celexa) in the past, but did not help and she felt emotionless

## 2024-01-13 NOTE — Assessment & Plan Note (Signed)
Noted on CT chest Lipid profile reviewed - wnl

## 2024-01-13 NOTE — Patient Instructions (Addendum)
 Please start taking Vitamin D  2000 IU once daily.  Please continue to take medications as prescribed.  Please continue to follow low salt diet and perform moderate exercise/walking as tolerated.

## 2024-01-13 NOTE — Assessment & Plan Note (Signed)
 S/p b/l THA Has had steroid injections, follows up with Orthopedic surgery -[facility] Tramadol  50 mg BID PRN Has been advised to avoid Marijuana use while on opioids and BZD Flexeril  as needed for muscle spasms

## 2024-01-13 NOTE — Assessment & Plan Note (Signed)
 Physical exam as documented. Fasting blood tests reviewed today. Needs repeat colonoscopy due to hyperplastic polyps in last colonoscopy in 03/21, but states that she is asymptomatic and will think about repeat colonoscopy (has high copay).

## 2024-01-19 LAB — TOXASSURE SELECT 13 (MW), URINE

## 2024-03-03 ENCOUNTER — Encounter: Payer: Self-pay | Admitting: Internal Medicine

## 2024-03-14 ENCOUNTER — Other Ambulatory Visit: Payer: Self-pay

## 2024-03-14 ENCOUNTER — Other Ambulatory Visit: Payer: Self-pay | Admitting: Internal Medicine

## 2024-03-14 DIAGNOSIS — F411 Generalized anxiety disorder: Secondary | ICD-10-CM

## 2024-03-14 DIAGNOSIS — I10 Essential (primary) hypertension: Secondary | ICD-10-CM

## 2024-03-14 DIAGNOSIS — M160 Bilateral primary osteoarthritis of hip: Secondary | ICD-10-CM

## 2024-03-14 MED ORDER — TRAMADOL HCL 50 MG PO TABS: 50.0000 mg | ORAL_TABLET | Freq: Two times a day (BID) | ORAL | 3 refills | Status: AC | PRN

## 2024-03-14 MED ORDER — LOSARTAN POTASSIUM-HCTZ 100-12.5 MG PO TABS: 1.0000 | ORAL_TABLET | Freq: Every day | ORAL | 3 refills | Status: AC

## 2024-03-14 MED ORDER — ALPRAZOLAM 1 MG PO TABS: 1.0000 mg | ORAL_TABLET | Freq: Three times a day (TID) | ORAL | 3 refills | Status: AC | PRN

## 2024-03-14 MED ORDER — CYCLOBENZAPRINE HCL 10 MG PO TABS: 10.0000 mg | ORAL_TABLET | Freq: Every evening | ORAL | 1 refills | Status: AC | PRN

## 2024-03-20 ENCOUNTER — Encounter: Payer: Self-pay | Admitting: Internal Medicine

## 2024-05-16 ENCOUNTER — Ambulatory Visit: Admitting: Internal Medicine
